# Patient Record
Sex: Male | Born: 1940 | Race: Black or African American | Hispanic: No | Smoking: Never smoker
Health system: Southern US, Community
[De-identification: ages and names within clinical notes are randomized; demographics above are authoritative.]

## PROBLEM LIST (undated history)

## (undated) ENCOUNTER — Emergency Department (HOSPITAL_COMMUNITY): Discharge status: Absent without leave | Payer: BC Managed Care – PPO

## (undated) DIAGNOSIS — G4730 Sleep apnea, unspecified: Secondary | ICD-10-CM

## (undated) DIAGNOSIS — I639 Cerebral infarction, unspecified: Secondary | ICD-10-CM

## (undated) DIAGNOSIS — I10 Essential (primary) hypertension: Secondary | ICD-10-CM

## (undated) DIAGNOSIS — C649 Malignant neoplasm of unspecified kidney, except renal pelvis: Secondary | ICD-10-CM

## (undated) DIAGNOSIS — N186 End stage renal disease: Secondary | ICD-10-CM

## (undated) DIAGNOSIS — J189 Pneumonia, unspecified organism: Secondary | ICD-10-CM

## (undated) DIAGNOSIS — I82409 Acute embolism and thrombosis of unspecified deep veins of unspecified lower extremity: Secondary | ICD-10-CM

## (undated) DIAGNOSIS — G629 Polyneuropathy, unspecified: Secondary | ICD-10-CM

## (undated) DIAGNOSIS — N400 Enlarged prostate without lower urinary tract symptoms: Secondary | ICD-10-CM

## (undated) DIAGNOSIS — D649 Anemia, unspecified: Secondary | ICD-10-CM

## (undated) DIAGNOSIS — E119 Type 2 diabetes mellitus without complications: Secondary | ICD-10-CM

## (undated) DIAGNOSIS — I517 Cardiomegaly: Secondary | ICD-10-CM

## (undated) DIAGNOSIS — N2581 Secondary hyperparathyroidism of renal origin: Secondary | ICD-10-CM

## (undated) DIAGNOSIS — R0989 Other specified symptoms and signs involving the circulatory and respiratory systems: Secondary | ICD-10-CM

## (undated) DIAGNOSIS — R001 Bradycardia, unspecified: Secondary | ICD-10-CM

## (undated) DIAGNOSIS — I350 Nonrheumatic aortic (valve) stenosis: Secondary | ICD-10-CM

## (undated) DIAGNOSIS — Z9289 Personal history of other medical treatment: Secondary | ICD-10-CM

## (undated) DIAGNOSIS — E875 Hyperkalemia: Secondary | ICD-10-CM

## (undated) DIAGNOSIS — Z8739 Personal history of other diseases of the musculoskeletal system and connective tissue: Secondary | ICD-10-CM

## (undated) DIAGNOSIS — R569 Unspecified convulsions: Secondary | ICD-10-CM

## (undated) DIAGNOSIS — Z992 Dependence on renal dialysis: Secondary | ICD-10-CM

## (undated) DIAGNOSIS — I4891 Unspecified atrial fibrillation: Secondary | ICD-10-CM

## (undated) HISTORY — DX: Cardiomegaly: I51.7

## (undated) HISTORY — DX: Bradycardia, unspecified: R00.1

## (undated) HISTORY — DX: Nonrheumatic aortic (valve) stenosis: I35.0

## (undated) HISTORY — PX: TONSILLECTOMY: SUR1361

## (undated) HISTORY — DX: Benign prostatic hyperplasia without lower urinary tract symptoms: N40.0

## (undated) HISTORY — PX: POSTERIOR CERVICAL LAMINECTOMY: SHX2248

## (undated) HISTORY — PX: AV FISTULA PLACEMENT: SHX1204

## (undated) HISTORY — PX: THROMBECTOMY: PRO61

## (undated) HISTORY — PX: RADIOFREQUENCY ABLATION KIDNEY: SHX2292

## (undated) HISTORY — DX: Malignant neoplasm of unspecified kidney, except renal pelvis: C64.9

## (undated) HISTORY — DX: Unspecified atrial fibrillation: I48.91

## (undated) HISTORY — DX: Secondary hyperparathyroidism of renal origin: N25.81

## (undated) HISTORY — DX: Anemia, unspecified: D64.9

## (undated) HISTORY — PX: AV FISTULA REPAIR: SHX563

## (undated) HISTORY — PX: BACK SURGERY: SHX140

## (undated) HISTORY — DX: Essential (primary) hypertension: I10

## (undated) HISTORY — DX: Polyneuropathy, unspecified: G62.9

## (undated) HISTORY — DX: Hyperkalemia: E87.5

## (undated) HISTORY — DX: Other specified symptoms and signs involving the circulatory and respiratory systems: R09.89

---

## 1979-08-08 HISTORY — PX: BUNIONECTOMY: SHX129

## 1997-08-07 DIAGNOSIS — I639 Cerebral infarction, unspecified: Secondary | ICD-10-CM

## 1997-08-07 HISTORY — DX: Cerebral infarction, unspecified: I63.9

## 2010-06-17 ENCOUNTER — Ambulatory Visit: Payer: Self-pay | Admitting: Vascular Surgery

## 2010-11-29 ENCOUNTER — Emergency Department (HOSPITAL_COMMUNITY)
Admission: EM | Admit: 2010-11-29 | Discharge: 2010-11-29 | Disposition: A | Discharge status: Home / Self Care | Payer: Medicare Other | Attending: Emergency Medicine | Admitting: Emergency Medicine

## 2010-11-29 DIAGNOSIS — Z79899 Other long term (current) drug therapy: Secondary | ICD-10-CM | POA: Insufficient documentation

## 2010-11-29 DIAGNOSIS — R569 Unspecified convulsions: Secondary | ICD-10-CM | POA: Insufficient documentation

## 2010-11-29 DIAGNOSIS — Z794 Long term (current) use of insulin: Secondary | ICD-10-CM | POA: Insufficient documentation

## 2010-11-29 DIAGNOSIS — R4182 Altered mental status, unspecified: Secondary | ICD-10-CM | POA: Insufficient documentation

## 2010-11-29 DIAGNOSIS — E785 Hyperlipidemia, unspecified: Secondary | ICD-10-CM | POA: Insufficient documentation

## 2010-11-29 DIAGNOSIS — Z7901 Long term (current) use of anticoagulants: Secondary | ICD-10-CM | POA: Insufficient documentation

## 2010-11-29 DIAGNOSIS — I4891 Unspecified atrial fibrillation: Secondary | ICD-10-CM | POA: Insufficient documentation

## 2010-11-29 DIAGNOSIS — I10 Essential (primary) hypertension: Secondary | ICD-10-CM | POA: Insufficient documentation

## 2010-11-29 DIAGNOSIS — Z85528 Personal history of other malignant neoplasm of kidney: Secondary | ICD-10-CM | POA: Insufficient documentation

## 2010-11-29 LAB — COMPREHENSIVE METABOLIC PANEL
Alkaline Phosphatase: 46 U/L (ref 39–117)
BUN: 23 mg/dL (ref 6–23)
CO2: 31 mEq/L (ref 19–32)
Calcium: 9.2 mg/dL (ref 8.4–10.5)
GFR calc non Af Amer: 13 mL/min — ABNORMAL LOW (ref >60)
Glucose, Bld: 155 mg/dL — ABNORMAL HIGH (ref 70–99)
Potassium: 3.7 mEq/L (ref 3.5–5.1)
Total Protein: 6.6 g/dL (ref 6.0–8.3)

## 2010-11-29 LAB — TROPONIN I: Troponin I: 0.04 ng/mL (ref 0.00–0.06)

## 2010-11-29 LAB — DIFFERENTIAL
Basophils Absolute: 0 10*3/uL (ref 0.0–0.1)
Basophils Relative: 0 % (ref 0–1)
Neutro Abs: 2.6 10*3/uL (ref 1.7–7.7)
Neutrophils Relative %: 61 % (ref 43–77)

## 2010-11-29 LAB — GLUCOSE, CAPILLARY: Glucose-Capillary: 121 mg/dL — ABNORMAL HIGH (ref 70–99)

## 2010-11-29 LAB — POCT CARDIAC MARKERS
CKMB, poc: 3.8 ng/mL (ref 1.0–8.0)
Myoglobin, poc: >500 ng/mL (ref 12–200)
Troponin i, poc: <0.05 ng/mL (ref 0.00–0.09)

## 2010-11-29 LAB — CBC
Hemoglobin: 11.2 g/dL — ABNORMAL LOW (ref 13.0–17.0)
Platelets: 187 10*3/uL (ref 150–400)
RBC: 4.06 MIL/uL — ABNORMAL LOW (ref 4.22–5.81)

## 2010-11-29 LAB — PROTIME-INR: Prothrombin Time: 16.9 seconds — ABNORMAL HIGH (ref 11.6–15.2)

## 2010-11-29 LAB — CK TOTAL AND CKMB (NOT AT ARMC)
Relative Index: INVALID (ref 0.0–2.5)
Total CK: 85 U/L (ref 7–232)

## 2010-12-20 NOTE — Assessment & Plan Note (Signed)
OFFICE VISIT   [name], [name]  DOB:  01/26/1941                                       06/17/2010  CHART#:[id number]   HISTORY OF PRESENT ILLNESS:  This is a 70 year old gentleman who  presents with chief complaint of right leg pain.  He has a longstanding  history of pain in his right leg that does not have a classic history of  claudication.  It does increase with walking but it is does not a set  amount of ambulation which brings on this pain.  Has never had any rest  type pain symptoms.  He has an extensive history of trauma to bilateral  lower extremities that have then resulted in compartment syndrome in  both legs.  First he got it in his left leg from a fall and then one on  his right leg so he had bilateral compartment releases at different  times.  Since his right compartment release he has noted previously some  complaints of pain around his Achilles.  At this point he describes it  as an aching sensation in his calves that is present even at rest.  It  does not wake him up from sleep.  He does not note any history of any  type of ulceration or gangrene in either feet.   PAST MEDICAL HISTORY:  End-stage renal disease requiring hemodialysis,  diabetes, hyperlipidemia, some type of seizure disorder, anemia, he had  a stroke that resulted in a left foot drop, secondary  hyperparathyroidism, atrial fibrillation, hypertension, benign prostatic  hypertrophy, obstructive sleep apnea, gout, osteoarthritis, a history of  renal cell carcinoma, trauma history of bilateral falls injuring both  legs that resulted in compartment syndromes.   PAST SURGICAL HISTORY:  Included radioablation of his right kidney renal  cell carcinoma.  He has had a right arteriovenous fistula placement  radiocephalic done in 0454.  He then required a fistulogram with  angioplasty in July of 2010.  He has had bilateral bunionectomy done and  then also bilateral compartment releases  completed.   SOCIAL HISTORY:  He is single, two children, a previous Runner, broadcasting/film/video.  He has  never smoked.  No alcohol or illicit drug use.   FAMILY HISTORY:  Mom and dad had diabetes and one brother had a heart  attack and he had a sister also with diabetes.   MEDICATIONS:  He is on Lantus, Coumadin, insulin, sorbitol, Renvela, he  has nitro tabs, Nephrocaps, allopurinol, Flomax, Sensipar, Keppra.   ALLERGIES:  His allergies include INFeD.   REVIEW OF SYSTEMS:  Today noted seizures, pain in legs with walking,  stroke, mini stroke, blood clots in veins, arthritis, joint pain, muscle  pain, atrial fibrillation, clotting disorder, occasional constipation  and kidney disease.  Otherwise the rest of his 12 point review of  systems was noted to be negative.   PHYSICAL EXAMINATION:  He did not have vitals listed.  General:  He was well-developed, well-nourished, no apparent distress.  Head:  Normocephalic, atraumatic.  He had some temporalis wasting.  ENT:  Oropharynx, his upper teeth are gone.  He has a denture.  Oropharynx did not have any erythema or exudate.  The nares were without  any erythema or drainage.  His hearing is grossly intact.  Eyes:  He had arcus senilis bilaterally, constricted pupils bilaterally  but he did  have some reaction to both direct and indirect pupillary  testing.  His extraocular movements were intact.  Neck:  He had supple neck.  No nuchal rigidity.  Pulmonary:  Symmetric expansion.  Good air movement.  No rhonchi, rales  or wheezing.  Cardiac:  Irregular rate and rhythm.  Normal S1-S2.  No rubs, thrills or  gallops.  He had a 2/6 systolic ejection murmur.  Vascular:  He had  palpable radials, brachials bilaterally and carotids.  There was no  bruit in either carotid.  His abdominal aorta was not palpable due to  his abdominal girth.  He had palpable femorals.  Did not appreciate  popliteals but he had easily palpable DPs bilaterally and posterior  tibial  pulses.  GI:  Soft abdomen.  Nontender.  Nondistended.  A little bit obese.  No  obvious masses.  No hepatosplenomegaly.  Musculoskeletal:  He had 5/5 upper extremity strength.  On the right arm  he has a widely patent radiocephalic arteriovenous fistula with a good  bruit and thrill.  In the lower extremities his feet are cool but with  the previously noted palpable pedal pulses.  There are no ischemic  changes in the feet.  No ulcerations or any type of gangrene.  Additionally he did have 5/5 symmetric strength in the lower  extremities.  I also briefly tested his knee.  There was no obvious  catch with passive testing or range of motion of the knee or at the  ankle.  There is no frank tenderness to palpation of the calf muscles on  either side.  There are incisions consistent with the previously noted  compartment release.  Neuro:  Cranial nerves II-XII were intact.  Sensation was intact in all  extremities.  Motor strength was as noted above.  I did not test this  patient's gait.  Psychiatric:  His judgment was intact.  His mood and affect were  appropriate for his clinical situation.  Skin:  The extremities are as listed otherwise I did not note any  obvious rashes.  Lymphatic:  He had no cervical, axillary or inguinal lymphadenopathy.   NON-INVASIVE VASCULAR IMAGING:  I also reviewed and finalized the  interpretation on bilateral lower extremity ABIs.  He does have calcific  disease with an ABI on the right side of 1.39 and 1.29, however, his  right posterior tibial and dorsalis pedis waveforms are biphasic  strongly and this is similarly on the left side strongly biphasic  signals.   MEDICAL DECISION MAKING:  This is a 70 year old gentleman with multiple  comorbidities, multiple risk factors for atherosclerosis and was sent  here for screening for peripheral arterial disease due to his right  pain.  He has atypical symptoms for claudication and also has a previous  trauma  history and history of bilateral compartment releases.  On ABI  examination he has no evidence of peripheral arterial disease and he has  palpable pedal pulses on examination.  Based on this I do not think his  pain in his right leg is due to peripheral arterial disease.  However,  with his significant trauma history I would suggest possible evaluation  by orthopedist and a set of ankle, knee, tib-fib x-rays.  He may in fact  need evaluation for possible physical therapy but I think the most  appropriate specialist to make that decision would probably be the  orthopedist.   Thank you for giving [place] the opportunity to participate in this  gentleman's care.  Leonides Sake, MD  Electronically Signed   BC/MEDQ  D:  06/17/2010  T:  06/20/2010  Job:  [id number]   cc:   Fayrene Fearing L. Deterding, M.D.

## 2011-02-09 ENCOUNTER — Other Ambulatory Visit (HOSPITAL_COMMUNITY): Payer: Medicare Other

## 2011-02-09 ENCOUNTER — Emergency Department (HOSPITAL_COMMUNITY): Payer: Medicare Other

## 2011-02-09 ENCOUNTER — Inpatient Hospital Stay (HOSPITAL_COMMUNITY)
Admission: EM | Admit: 2011-02-09 | Discharge: 2011-02-20 | DRG: 490 | Disposition: A | Discharge status: Rehabilitation Facility | Payer: Medicare Other | Attending: Cardiology | Admitting: Cardiology

## 2011-02-09 DIAGNOSIS — M109 Gout, unspecified: Secondary | ICD-10-CM | POA: Diagnosis present

## 2011-02-09 DIAGNOSIS — E785 Hyperlipidemia, unspecified: Secondary | ICD-10-CM | POA: Diagnosis present

## 2011-02-09 DIAGNOSIS — Z85528 Personal history of other malignant neoplasm of kidney: Secondary | ICD-10-CM

## 2011-02-09 DIAGNOSIS — N039 Chronic nephritic syndrome with unspecified morphologic changes: Secondary | ICD-10-CM | POA: Diagnosis present

## 2011-02-09 DIAGNOSIS — N186 End stage renal disease: Secondary | ICD-10-CM | POA: Diagnosis present

## 2011-02-09 DIAGNOSIS — Z91199 Patient's noncompliance with other medical treatment and regimen due to unspecified reason: Secondary | ICD-10-CM

## 2011-02-09 DIAGNOSIS — Z7982 Long term (current) use of aspirin: Secondary | ICD-10-CM

## 2011-02-09 DIAGNOSIS — K5900 Constipation, unspecified: Secondary | ICD-10-CM | POA: Diagnosis not present

## 2011-02-09 DIAGNOSIS — I498 Other specified cardiac arrhythmias: Secondary | ICD-10-CM | POA: Diagnosis present

## 2011-02-09 DIAGNOSIS — G40909 Epilepsy, unspecified, not intractable, without status epilepticus: Secondary | ICD-10-CM | POA: Diagnosis present

## 2011-02-09 DIAGNOSIS — Z923 Personal history of irradiation: Secondary | ICD-10-CM

## 2011-02-09 DIAGNOSIS — I359 Nonrheumatic aortic valve disorder, unspecified: Secondary | ICD-10-CM | POA: Diagnosis present

## 2011-02-09 DIAGNOSIS — Z9181 History of falling: Secondary | ICD-10-CM

## 2011-02-09 DIAGNOSIS — E876 Hypokalemia: Secondary | ICD-10-CM | POA: Diagnosis present

## 2011-02-09 DIAGNOSIS — M4712 Other spondylosis with myelopathy, cervical region: Principal | ICD-10-CM | POA: Diagnosis present

## 2011-02-09 DIAGNOSIS — R29898 Other symptoms and signs involving the musculoskeletal system: Secondary | ICD-10-CM | POA: Diagnosis present

## 2011-02-09 DIAGNOSIS — Z833 Family history of diabetes mellitus: Secondary | ICD-10-CM

## 2011-02-09 DIAGNOSIS — Z794 Long term (current) use of insulin: Secondary | ICD-10-CM

## 2011-02-09 DIAGNOSIS — E1149 Type 2 diabetes mellitus with other diabetic neurological complication: Secondary | ICD-10-CM | POA: Diagnosis present

## 2011-02-09 DIAGNOSIS — D631 Anemia in chronic kidney disease: Secondary | ICD-10-CM | POA: Diagnosis present

## 2011-02-09 DIAGNOSIS — Z79899 Other long term (current) drug therapy: Secondary | ICD-10-CM

## 2011-02-09 DIAGNOSIS — I4891 Unspecified atrial fibrillation: Secondary | ICD-10-CM | POA: Diagnosis present

## 2011-02-09 DIAGNOSIS — Z7901 Long term (current) use of anticoagulants: Secondary | ICD-10-CM

## 2011-02-09 DIAGNOSIS — N2581 Secondary hyperparathyroidism of renal origin: Secondary | ICD-10-CM | POA: Diagnosis present

## 2011-02-09 DIAGNOSIS — E1142 Type 2 diabetes mellitus with diabetic polyneuropathy: Secondary | ICD-10-CM | POA: Diagnosis present

## 2011-02-09 DIAGNOSIS — I120 Hypertensive chronic kidney disease with stage 5 chronic kidney disease or end stage renal disease: Secondary | ICD-10-CM | POA: Diagnosis present

## 2011-02-09 DIAGNOSIS — Z992 Dependence on renal dialysis: Secondary | ICD-10-CM

## 2011-02-09 DIAGNOSIS — E875 Hyperkalemia: Secondary | ICD-10-CM | POA: Diagnosis not present

## 2011-02-09 DIAGNOSIS — I69998 Other sequelae following unspecified cerebrovascular disease: Secondary | ICD-10-CM

## 2011-02-09 DIAGNOSIS — M1990 Unspecified osteoarthritis, unspecified site: Secondary | ICD-10-CM | POA: Diagnosis present

## 2011-02-09 DIAGNOSIS — Z9119 Patient's noncompliance with other medical treatment and regimen: Secondary | ICD-10-CM

## 2011-02-09 LAB — CBC
MCH: 28.5 pg (ref 26.0–34.0)
MCHC: 33.1 g/dL (ref 30.0–36.0)
MCV: 86 fL (ref 78.0–100.0)
Platelets: 223 10*3/uL (ref 150–400)
RBC: 3.51 MIL/uL — ABNORMAL LOW (ref 4.22–5.81)
RDW: 14.4 % (ref 11.5–15.5)

## 2011-02-09 LAB — BASIC METABOLIC PANEL
CO2: 34 mEq/L — ABNORMAL HIGH (ref 19–32)
Calcium: 9 mg/dL (ref 8.4–10.5)
Creatinine, Ser: 3.19 mg/dL — ABNORMAL HIGH (ref 0.50–1.35)
GFR calc non Af Amer: 19 mL/min — ABNORMAL LOW (ref >60)
Glucose, Bld: 91 mg/dL (ref 70–99)
Sodium: 139 mEq/L (ref 135–145)

## 2011-02-09 LAB — DIFFERENTIAL
Basophils Absolute: 0 10*3/uL (ref 0.0–0.1)
Basophils Relative: 0 % (ref 0–1)
Eosinophils Absolute: 0.1 10*3/uL (ref 0.0–0.7)
Eosinophils Relative: 2 % (ref 0–5)
Neutrophils Relative %: 57 % (ref 43–77)

## 2011-02-09 LAB — GLUCOSE, CAPILLARY
Glucose-Capillary: 91 mg/dL (ref 70–99)
Glucose-Capillary: 230 mg/dL — ABNORMAL HIGH (ref 70–99)

## 2011-02-09 LAB — PROTIME-INR: Prothrombin Time: 14.8 seconds (ref 11.6–15.2)

## 2011-02-10 ENCOUNTER — Inpatient Hospital Stay (HOSPITAL_COMMUNITY): Payer: Medicare Other

## 2011-02-10 DIAGNOSIS — G8250 Quadriplegia, unspecified: Secondary | ICD-10-CM

## 2011-02-10 DIAGNOSIS — M4712 Other spondylosis with myelopathy, cervical region: Secondary | ICD-10-CM

## 2011-02-10 LAB — LIPID PANEL
HDL: 49 mg/dL (ref >39)
LDL Cholesterol: 38 mg/dL (ref 0–99)
Triglycerides: 66 mg/dL (ref <150)
VLDL: 13 mg/dL (ref 0–40)

## 2011-02-10 LAB — GLUCOSE, CAPILLARY: Glucose-Capillary: 187 mg/dL — ABNORMAL HIGH (ref 70–99)

## 2011-02-11 ENCOUNTER — Inpatient Hospital Stay (HOSPITAL_COMMUNITY): Payer: Medicare Other

## 2011-02-11 DIAGNOSIS — Z01810 Encounter for preprocedural cardiovascular examination: Secondary | ICD-10-CM

## 2011-02-11 DIAGNOSIS — I4891 Unspecified atrial fibrillation: Secondary | ICD-10-CM

## 2011-02-11 LAB — CBC
Hemoglobin: 9.4 g/dL — ABNORMAL LOW (ref 13.0–17.0)
MCH: 27.8 pg (ref 26.0–34.0)
Platelets: 202 10*3/uL (ref 150–400)
RBC: 3.38 MIL/uL — ABNORMAL LOW (ref 4.22–5.81)
WBC: 4.4 10*3/uL (ref 4.0–10.5)

## 2011-02-11 LAB — RENAL FUNCTION PANEL
CO2: 34 mEq/L — ABNORMAL HIGH (ref 19–32)
CO2: 33 mEq/L — ABNORMAL HIGH (ref 19–32)
Calcium: 9.1 mg/dL (ref 8.4–10.5)
Calcium: 8.9 mg/dL (ref 8.4–10.5)
Chloride: 98 mEq/L (ref 96–112)
GFR calc Af Amer: 10 mL/min — ABNORMAL LOW (ref >60)
GFR calc Af Amer: 10 mL/min — ABNORMAL LOW (ref >60)
GFR calc non Af Amer: 8 mL/min — ABNORMAL LOW (ref >60)
GFR calc non Af Amer: 8 mL/min — ABNORMAL LOW (ref >60)
Glucose, Bld: 102 mg/dL — ABNORMAL HIGH (ref 70–99)
Glucose, Bld: 84 mg/dL (ref 70–99)
Potassium: 4.2 mEq/L (ref 3.5–5.1)
Potassium: 4.4 mEq/L (ref 3.5–5.1)
Sodium: 141 mEq/L (ref 135–145)
Sodium: 140 mEq/L (ref 135–145)

## 2011-02-11 LAB — MAGNESIUM
Magnesium: 2.5 mg/dL (ref 1.5–2.5)
Magnesium: 2.4 mg/dL (ref 1.5–2.5)

## 2011-02-11 LAB — HEMOGLOBIN A1C: Mean Plasma Glucose: 114 mg/dL (ref <117)

## 2011-02-11 LAB — PROTIME-INR: INR: 1.2 (ref 0.00–1.49)

## 2011-02-11 LAB — GLUCOSE, CAPILLARY
Glucose-Capillary: 116 mg/dL — ABNORMAL HIGH (ref 70–99)
Glucose-Capillary: 117 mg/dL — ABNORMAL HIGH (ref 70–99)

## 2011-02-11 NOTE — Consult Note (Signed)
NAMEAVYAN, [name] NO.:  [id number]  MEDICAL RECORD NO.:  [id number]  LOCATION:  6713                         FACILITY:  [facility]  PHYSICIAN:  Danae Orleans. Venetia Maxon, M.D.  DATE OF BIRTH:  1940-12-10  DATE OF CONSULTATION:  02/10/2011 DATE OF DISCHARGE:                                CONSULTATION   REASON FOR CONSULTATION:  Bilateral upper and lower extremity weakness with numbness and frequent falling.  HISTORY OF PRESENT ILLNESS:  [name] is a 70 year old man with type 2 diabetes, end-stage renal disease on hemodialysis, paroxysmal atrial fibrillation, and multiple other medical problems.  He has developed progressive muscular weakness and frequent falling.  He says that 3-4 years ago, he had numbness in several fingers in the right hand which progressed to numbness in both of his hands.  He previously had a stroke in 1999, which caused some weakness in his left leg including a footdrop, and has now developed progressive weakness in both of his lower extremities and starting to lose function in his right leg, which has traditionally been his stronger leg.  He has had some problems of incontinence of stool over the last several months.  He has noted some slight weight loss.  PAST MEDICAL HISTORY:  Significant for hemodialysis on Tuesdays, Thursdays, and Saturdays, chronic atrial fibrillation for he is on Coumadin, history of CVA with residual left leg weakness since 1999, dyslipidemia, anemia of chronic disease, secondary hyperparathyroidism, history of kidney malignancy treated with radioablation 7 years ago, type 2 diabetes on insulin, gout, osteoarthritis, seizure disorder, history of CVA.  MEDICATIONS: 1. Coumadin 7.5 mg daily. 2. Keppra 250 mg twice daily. 3. Lantus 16 units at bedtime. 4. Humalog with meals. 5. Sorbitol 2 tablespoons every 3 hours as needed. 6. Renvela 800 mg three tablets t.i.d. a.c. 7. Humalog 5 units t.i.d. a.c. 8. Allopurinol  100 mg daily. 9. Flomax 0.4 mg daily. 10.Sensipar 30 mg daily.  He has no known drug allergies.  SOCIAL HISTORY:  He is single and divorced.  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  FAMILY HISTORY:  Significant for diabetes in both parents.  PHYSICAL EXAMINATION:  Today, he is alert, pleasant, cooperative and appears to have weakness in both upper extremity approximately 4/5 and has marked hand intrinsic weakness bilaterally, left greater than right. He also has diminish motor strength in both lower extremities 1-2/5 in his left leg and about 3-4/5 in his right leg with proximal lower extremity weakness as well.  He has a positive Hoffman sign in left greater than right hand.  Reflexes are 2 in the biceps and triceps, 2 at the knees.  Also on examination today, he has mildly positive Tinel sign in both hands.  Notes numbness throughout both hands and both of his feet.  I reviewed imaging studies consistent with a cervical MRI which demonstrates marked spinal cord compression and severe spinal stenosis C3-C5 levels, principally the posterior cervical cord compression secondary to the ligamentous hypertrophy.  The lumbar MRI was essentially unremarkable.  IMPRESSION AND RECOMMENDATIONS:  [name] is a 70 year old man with end-stage renal disease on hemodialysis, on Coumadin.  He has multiple medical problems.  He has severe spinal cord compression with decreased cord signal on MRI and would likely benefit from a posterior cervical laminectomy from C3-C5 levels if he is medically cleared to proceed.  He will need to come off his Coumadin and have his INR corrected if we were to proceed.  We would like to have Cardiology clearance and also Medical clearance for [place] to proceed with surgical decompression.  I discussed this at length with the patient.  He said that he wished to do something.  He wants to be aggressive if there is a chance of him improving.  I told him  there is significant risk with multiple medical comorbidities proceeding with surgery; however, if he continues as he is, he is going to lose function of his limb where he is going to become unable to walk.  I told him if we do surgery, there is no guarantee that he will get better, but I think if we do not proceed with surgery, I think it is likely that he will progressively worsen.  He wishes to proceed.  We will await medical clearance for [place] to be able to go ahead with surgery.     Danae Orleans. Venetia Maxon, M.D.     JDS/MEDQ  D:  02/10/2011  T:  02/11/2011  Job:  [id number]  Electronically Signed by Maeola Harman M.D. on 02/11/2011 01:14:31 PM

## 2011-02-12 DIAGNOSIS — I359 Nonrheumatic aortic valve disorder, unspecified: Secondary | ICD-10-CM

## 2011-02-12 DIAGNOSIS — R0989 Other specified symptoms and signs involving the circulatory and respiratory systems: Secondary | ICD-10-CM

## 2011-02-12 LAB — GLUCOSE, CAPILLARY
Glucose-Capillary: 101 mg/dL — ABNORMAL HIGH (ref 70–99)
Glucose-Capillary: 98 mg/dL (ref 70–99)
Glucose-Capillary: 115 mg/dL — ABNORMAL HIGH (ref 70–99)

## 2011-02-12 LAB — PROTIME-INR: Prothrombin Time: 18.7 seconds — ABNORMAL HIGH (ref 11.6–15.2)

## 2011-02-13 ENCOUNTER — Inpatient Hospital Stay (HOSPITAL_COMMUNITY): Payer: Medicare Other

## 2011-02-13 LAB — GLUCOSE, CAPILLARY
Glucose-Capillary: 70 mg/dL (ref 70–99)
Glucose-Capillary: 83 mg/dL (ref 70–99)

## 2011-02-13 LAB — CBC
MCH: 28.7 pg (ref 26.0–34.0)
MCV: 87 fL (ref 78.0–100.0)
Platelets: 189 10*3/uL (ref 150–400)
RDW: 14.3 % (ref 11.5–15.5)
WBC: 5.4 10*3/uL (ref 4.0–10.5)

## 2011-02-13 LAB — RENAL FUNCTION PANEL
Albumin: 2.6 g/dL — ABNORMAL LOW (ref 3.5–5.2)
BUN: 42 mg/dL — ABNORMAL HIGH (ref 6–23)
Calcium: 9.6 mg/dL (ref 8.4–10.5)
Glucose, Bld: 119 mg/dL — ABNORMAL HIGH (ref 70–99)
Phosphorus: 4.4 mg/dL (ref 2.3–4.6)

## 2011-02-13 LAB — PROTIME-INR: Prothrombin Time: 16.2 seconds — ABNORMAL HIGH (ref 11.6–15.2)

## 2011-02-14 ENCOUNTER — Inpatient Hospital Stay (HOSPITAL_COMMUNITY): Payer: Medicare Other

## 2011-02-14 DIAGNOSIS — I060 Rheumatic aortic stenosis: Secondary | ICD-10-CM

## 2011-02-14 LAB — RENAL FUNCTION PANEL
Albumin: 2.4 g/dL — ABNORMAL LOW (ref 3.5–5.2)
BUN: 58 mg/dL — ABNORMAL HIGH (ref 6–23)
Creatinine, Ser: 8.45 mg/dL — ABNORMAL HIGH (ref 0.50–1.35)
Glucose, Bld: 92 mg/dL (ref 70–99)
Phosphorus: 5.2 mg/dL — ABNORMAL HIGH (ref 2.3–4.6)

## 2011-02-14 LAB — CBC
HCT: 27.6 % — ABNORMAL LOW (ref 39.0–52.0)
Hemoglobin: 9.1 g/dL — ABNORMAL LOW (ref 13.0–17.0)
MCHC: 33 g/dL (ref 30.0–36.0)
MCV: 86.3 fL (ref 78.0–100.0)
RDW: 14.4 % (ref 11.5–15.5)

## 2011-02-14 LAB — GLUCOSE, CAPILLARY: Glucose-Capillary: 189 mg/dL — ABNORMAL HIGH (ref 70–99)

## 2011-02-15 ENCOUNTER — Inpatient Hospital Stay (HOSPITAL_COMMUNITY): Payer: Medicare Other

## 2011-02-15 LAB — RENAL FUNCTION PANEL
Albumin: 2.5 g/dL — ABNORMAL LOW (ref 3.5–5.2)
CO2: 29 mEq/L (ref 19–32)
Calcium: 8.8 mg/dL (ref 8.4–10.5)
Creatinine, Ser: 5.34 mg/dL — ABNORMAL HIGH (ref 0.50–1.35)
GFR calc Af Amer: 13 mL/min — ABNORMAL LOW (ref >60)
GFR calc non Af Amer: 11 mL/min — ABNORMAL LOW (ref >60)
Sodium: 136 mEq/L (ref 135–145)

## 2011-02-15 LAB — CBC
HCT: 28.9 % — ABNORMAL LOW (ref 39.0–52.0)
MCV: 87.8 fL (ref 78.0–100.0)
Platelets: 204 10*3/uL (ref 150–400)
RBC: 3.29 MIL/uL — ABNORMAL LOW (ref 4.22–5.81)
RDW: 14.7 % (ref 11.5–15.5)
WBC: 5 10*3/uL (ref 4.0–10.5)

## 2011-02-15 LAB — GLUCOSE, CAPILLARY

## 2011-02-15 LAB — SURGICAL PCR SCREEN: Staphylococcus aureus: POSITIVE — AB

## 2011-02-15 LAB — APTT: aPTT: 36 seconds (ref 24–37)

## 2011-02-15 LAB — ABO/RH: ABO/RH(D): O POS

## 2011-02-15 LAB — PROTIME-INR: INR: 1.24 (ref 0.00–1.49)

## 2011-02-16 ENCOUNTER — Inpatient Hospital Stay (HOSPITAL_COMMUNITY): Payer: Medicare Other

## 2011-02-16 LAB — CBC
HCT: 28.7 % — ABNORMAL LOW (ref 39.0–52.0)
Hemoglobin: 9.4 g/dL — ABNORMAL LOW (ref 13.0–17.0)
MCH: 28.3 pg (ref 26.0–34.0)
MCHC: 32.8 g/dL (ref 30.0–36.0)
RBC: 3.32 MIL/uL — ABNORMAL LOW (ref 4.22–5.81)

## 2011-02-16 LAB — GLUCOSE, CAPILLARY
Glucose-Capillary: 108 mg/dL — ABNORMAL HIGH (ref 70–99)
Glucose-Capillary: 142 mg/dL — ABNORMAL HIGH (ref 70–99)
Glucose-Capillary: 127 mg/dL — ABNORMAL HIGH (ref 70–99)

## 2011-02-16 LAB — BASIC METABOLIC PANEL
BUN: 47 mg/dL — ABNORMAL HIGH (ref 6–23)
CO2: 27 mEq/L (ref 19–32)
Chloride: 98 mEq/L (ref 96–112)
GFR calc Af Amer: 9 mL/min — ABNORMAL LOW (ref >60)
Potassium: 5.6 mEq/L — ABNORMAL HIGH (ref 3.5–5.1)

## 2011-02-16 LAB — PROTIME-INR: Prothrombin Time: 15.1 seconds (ref 11.6–15.2)

## 2011-02-17 LAB — GLUCOSE, CAPILLARY
Glucose-Capillary: 93 mg/dL (ref 70–99)
Glucose-Capillary: 106 mg/dL — ABNORMAL HIGH (ref 70–99)

## 2011-02-17 LAB — POCT I-STAT 4, (NA,K, GLUC, HGB,HCT)
Glucose, Bld: 91 mg/dL (ref 70–99)
HCT: 27 % — ABNORMAL LOW (ref 39.0–52.0)
Potassium: 5.6 mEq/L — ABNORMAL HIGH (ref 3.5–5.1)

## 2011-02-17 LAB — POCT I-STAT GLUCOSE: Operator id: 132841

## 2011-02-18 ENCOUNTER — Inpatient Hospital Stay (HOSPITAL_COMMUNITY): Payer: Medicare Other

## 2011-02-18 LAB — GLUCOSE, CAPILLARY
Glucose-Capillary: 135 mg/dL — ABNORMAL HIGH (ref 70–99)
Glucose-Capillary: 122 mg/dL — ABNORMAL HIGH (ref 70–99)

## 2011-02-18 LAB — RENAL FUNCTION PANEL
CO2: 31 mEq/L (ref 19–32)
Chloride: 94 mEq/L — ABNORMAL LOW (ref 96–112)
GFR calc Af Amer: 8 mL/min — ABNORMAL LOW (ref >60)
GFR calc non Af Amer: 6 mL/min — ABNORMAL LOW (ref >60)
Glucose, Bld: 126 mg/dL — ABNORMAL HIGH (ref 70–99)
Sodium: 134 mEq/L — ABNORMAL LOW (ref 135–145)

## 2011-02-18 LAB — CBC
HCT: 26.7 % — ABNORMAL LOW (ref 39.0–52.0)
MCH: 28.4 pg (ref 26.0–34.0)
MCV: 87.3 fL (ref 78.0–100.0)
RBC: 3.06 MIL/uL — ABNORMAL LOW (ref 4.22–5.81)
RDW: 14.5 % (ref 11.5–15.5)
WBC: 5.8 10*3/uL (ref 4.0–10.5)

## 2011-02-19 LAB — CROSSMATCH
ABO/RH(D): O POS
Antibody Screen: NEGATIVE
Unit division: 0

## 2011-02-19 LAB — GLUCOSE, CAPILLARY
Glucose-Capillary: 82 mg/dL (ref 70–99)
Glucose-Capillary: 122 mg/dL — ABNORMAL HIGH (ref 70–99)
Glucose-Capillary: 138 mg/dL — ABNORMAL HIGH (ref 70–99)

## 2011-02-20 ENCOUNTER — Inpatient Hospital Stay (HOSPITAL_COMMUNITY)
Admission: AD | Admit: 2011-02-20 | Discharge: 2011-03-14 | DRG: 945 | Disposition: A | Discharge status: Skilled Nursing Facility | Payer: Medicare Other | Source: Ambulatory Visit | Attending: Physical Medicine & Rehabilitation | Admitting: Physical Medicine & Rehabilitation

## 2011-02-20 DIAGNOSIS — IMO0001 Reserved for inherently not codable concepts without codable children: Secondary | ICD-10-CM

## 2011-02-20 DIAGNOSIS — M4712 Other spondylosis with myelopathy, cervical region: Secondary | ICD-10-CM

## 2011-02-20 DIAGNOSIS — F329 Major depressive disorder, single episode, unspecified: Secondary | ICD-10-CM

## 2011-02-20 DIAGNOSIS — D509 Iron deficiency anemia, unspecified: Secondary | ICD-10-CM

## 2011-02-20 DIAGNOSIS — D638 Anemia in other chronic diseases classified elsewhere: Secondary | ICD-10-CM

## 2011-02-20 DIAGNOSIS — F3289 Other specified depressive episodes: Secondary | ICD-10-CM

## 2011-02-20 DIAGNOSIS — D62 Acute posthemorrhagic anemia: Secondary | ICD-10-CM

## 2011-02-20 DIAGNOSIS — G609 Hereditary and idiopathic neuropathy, unspecified: Secondary | ICD-10-CM

## 2011-02-20 DIAGNOSIS — Z7901 Long term (current) use of anticoagulants: Secondary | ICD-10-CM

## 2011-02-20 DIAGNOSIS — G4733 Obstructive sleep apnea (adult) (pediatric): Secondary | ICD-10-CM

## 2011-02-20 DIAGNOSIS — E1165 Type 2 diabetes mellitus with hyperglycemia: Secondary | ICD-10-CM

## 2011-02-20 DIAGNOSIS — G8250 Quadriplegia, unspecified: Secondary | ICD-10-CM

## 2011-02-20 DIAGNOSIS — N186 End stage renal disease: Secondary | ICD-10-CM

## 2011-02-20 DIAGNOSIS — Z85528 Personal history of other malignant neoplasm of kidney: Secondary | ICD-10-CM

## 2011-02-20 DIAGNOSIS — I498 Other specified cardiac arrhythmias: Secondary | ICD-10-CM

## 2011-02-20 DIAGNOSIS — Z9889 Other specified postprocedural states: Secondary | ICD-10-CM

## 2011-02-20 DIAGNOSIS — I69959 Hemiplegia and hemiparesis following unspecified cerebrovascular disease affecting unspecified side: Secondary | ICD-10-CM

## 2011-02-20 DIAGNOSIS — R5381 Other malaise: Secondary | ICD-10-CM

## 2011-02-20 DIAGNOSIS — N319 Neuromuscular dysfunction of bladder, unspecified: Secondary | ICD-10-CM

## 2011-02-20 DIAGNOSIS — Z5189 Encounter for other specified aftercare: Principal | ICD-10-CM

## 2011-02-20 DIAGNOSIS — Z794 Long term (current) use of insulin: Secondary | ICD-10-CM

## 2011-02-20 DIAGNOSIS — K5900 Constipation, unspecified: Secondary | ICD-10-CM

## 2011-02-20 DIAGNOSIS — I69998 Other sequelae following unspecified cerebrovascular disease: Secondary | ICD-10-CM

## 2011-02-20 DIAGNOSIS — N2581 Secondary hyperparathyroidism of renal origin: Secondary | ICD-10-CM

## 2011-02-20 DIAGNOSIS — M216X9 Other acquired deformities of unspecified foot: Secondary | ICD-10-CM

## 2011-02-20 DIAGNOSIS — I359 Nonrheumatic aortic valve disorder, unspecified: Secondary | ICD-10-CM

## 2011-02-20 DIAGNOSIS — M109 Gout, unspecified: Secondary | ICD-10-CM

## 2011-02-20 DIAGNOSIS — B9689 Other specified bacterial agents as the cause of diseases classified elsewhere: Secondary | ICD-10-CM

## 2011-02-20 DIAGNOSIS — L89109 Pressure ulcer of unspecified part of back, unspecified stage: Secondary | ICD-10-CM

## 2011-02-20 DIAGNOSIS — N390 Urinary tract infection, site not specified: Secondary | ICD-10-CM

## 2011-02-20 DIAGNOSIS — G40909 Epilepsy, unspecified, not intractable, without status epilepticus: Secondary | ICD-10-CM

## 2011-02-20 DIAGNOSIS — I120 Hypertensive chronic kidney disease with stage 5 chronic kidney disease or end stage renal disease: Secondary | ICD-10-CM

## 2011-02-20 DIAGNOSIS — E119 Type 2 diabetes mellitus without complications: Secondary | ICD-10-CM

## 2011-02-20 DIAGNOSIS — Z79899 Other long term (current) drug therapy: Secondary | ICD-10-CM

## 2011-02-20 DIAGNOSIS — I4891 Unspecified atrial fibrillation: Secondary | ICD-10-CM

## 2011-02-20 DIAGNOSIS — Z992 Dependence on renal dialysis: Secondary | ICD-10-CM

## 2011-02-20 DIAGNOSIS — L8992 Pressure ulcer of unspecified site, stage 2: Secondary | ICD-10-CM

## 2011-02-21 ENCOUNTER — Inpatient Hospital Stay (HOSPITAL_COMMUNITY): Payer: Medicare Other

## 2011-02-21 LAB — GLUCOSE, CAPILLARY: Glucose-Capillary: 110 mg/dL — ABNORMAL HIGH (ref 70–99)

## 2011-02-21 LAB — CBC
HCT: 24.7 % — ABNORMAL LOW (ref 39.0–52.0)
Hemoglobin: 8.2 g/dL — ABNORMAL LOW (ref 13.0–17.0)
MCH: 28.3 pg (ref 26.0–34.0)
MCHC: 33.2 g/dL (ref 30.0–36.0)
Platelets: 199 10*3/uL (ref 150–400)
RBC: 2.9 MIL/uL — ABNORMAL LOW (ref 4.22–5.81)
WBC: 5.6 10*3/uL (ref 4.0–10.5)

## 2011-02-21 LAB — RENAL FUNCTION PANEL
Calcium: 9.8 mg/dL (ref 8.4–10.5)
Creatinine, Ser: 9.67 mg/dL — ABNORMAL HIGH (ref 0.50–1.35)
GFR calc Af Amer: 7 mL/min — ABNORMAL LOW (ref >60)
GFR calc non Af Amer: 5 mL/min — ABNORMAL LOW (ref >60)
Glucose, Bld: 152 mg/dL — ABNORMAL HIGH (ref 70–99)
Phosphorus: 6.8 mg/dL — ABNORMAL HIGH (ref 2.3–4.6)
Sodium: 131 mEq/L — ABNORMAL LOW (ref 135–145)

## 2011-02-22 DIAGNOSIS — N186 End stage renal disease: Secondary | ICD-10-CM

## 2011-02-22 DIAGNOSIS — Z5189 Encounter for other specified aftercare: Secondary | ICD-10-CM

## 2011-02-22 DIAGNOSIS — M4712 Other spondylosis with myelopathy, cervical region: Secondary | ICD-10-CM

## 2011-02-22 DIAGNOSIS — G8250 Quadriplegia, unspecified: Secondary | ICD-10-CM

## 2011-02-22 DIAGNOSIS — E1165 Type 2 diabetes mellitus with hyperglycemia: Secondary | ICD-10-CM

## 2011-02-22 LAB — URINE MICROSCOPIC-ADD ON

## 2011-02-22 LAB — URINALYSIS, ROUTINE W REFLEX MICROSCOPIC
Bilirubin Urine: NEGATIVE
Nitrite: NEGATIVE
Protein, ur: 100 mg/dL — AB
Specific Gravity, Urine: 1.011 (ref 1.005–1.030)
Urobilinogen, UA: 0.2 mg/dL (ref 0.0–1.0)

## 2011-02-22 LAB — GLUCOSE, CAPILLARY
Glucose-Capillary: 97 mg/dL (ref 70–99)
Glucose-Capillary: 105 mg/dL — ABNORMAL HIGH (ref 70–99)
Glucose-Capillary: 101 mg/dL — ABNORMAL HIGH (ref 70–99)

## 2011-02-23 ENCOUNTER — Inpatient Hospital Stay (HOSPITAL_COMMUNITY): Payer: Medicare Other

## 2011-02-23 LAB — RENAL FUNCTION PANEL
Albumin: 2.5 g/dL — ABNORMAL LOW (ref 3.5–5.2)
CO2: 29 mEq/L (ref 19–32)
Chloride: 94 mEq/L — ABNORMAL LOW (ref 96–112)
Creatinine, Ser: 8.07 mg/dL — ABNORMAL HIGH (ref 0.50–1.35)
GFR calc Af Amer: 8 mL/min — ABNORMAL LOW (ref >60)
GFR calc non Af Amer: 7 mL/min — ABNORMAL LOW (ref >60)
Potassium: 5.5 mEq/L — ABNORMAL HIGH (ref 3.5–5.1)
Sodium: 134 mEq/L — ABNORMAL LOW (ref 135–145)

## 2011-02-23 LAB — CBC
MCV: 84.9 fL (ref 78.0–100.0)
Platelets: 209 10*3/uL (ref 150–400)
RBC: 2.99 MIL/uL — ABNORMAL LOW (ref 4.22–5.81)
RDW: 14.5 % (ref 11.5–15.5)
WBC: 6.1 10*3/uL (ref 4.0–10.5)

## 2011-02-23 LAB — GLUCOSE, CAPILLARY: Glucose-Capillary: 96 mg/dL (ref 70–99)

## 2011-02-24 DIAGNOSIS — N186 End stage renal disease: Secondary | ICD-10-CM

## 2011-02-24 DIAGNOSIS — G8250 Quadriplegia, unspecified: Secondary | ICD-10-CM

## 2011-02-24 DIAGNOSIS — Z5189 Encounter for other specified aftercare: Secondary | ICD-10-CM

## 2011-02-24 DIAGNOSIS — E1165 Type 2 diabetes mellitus with hyperglycemia: Secondary | ICD-10-CM

## 2011-02-24 DIAGNOSIS — M4712 Other spondylosis with myelopathy, cervical region: Secondary | ICD-10-CM

## 2011-02-24 LAB — GLUCOSE, CAPILLARY
Glucose-Capillary: 103 mg/dL — ABNORMAL HIGH (ref 70–99)
Glucose-Capillary: 138 mg/dL — ABNORMAL HIGH (ref 70–99)

## 2011-02-24 NOTE — Progress Notes (Signed)
[name], ECKSTEIN NO.:  [id number]  MEDICAL RECORD NO.:  [id number]  LOCATION:  6713                         FACILITY:  [facility]  PHYSICIAN:  Rosanna Randy, MDDATE OF BIRTH:  1940/08/12                                PROGRESS NOTE   DAY OF DISCHARGE: Still pending at this moment.  PRIMARY CARE PHYSICIAN: Dr. Darrick Penna.  CURRENT DIAGNOSES: 1. End-stage renal disease on hemodialysis Tuesday, Thursday, and     Saturday. 2. Myelopathy causing the patient to experience right and left upper     extremity weakness and lack of coordination with also right lower     extremity weakness. 3. Anemia of chronic disease secondary to end-stage renal disease. 4. Atrial fibrillation. 5. Aortic stenosis, moderate. 6. Secondary hyperparathyroidism. 7. Diabetes. 8. Seizure disorder. 9. Neuropathy, diabetic. 10.Hypertension. 11.Hyperkalemia. 12.Renal lesion at the upper pole of the right kidney.  The patient     had a history of renal cancer, but according to him was treated     with a gamma knife radiation. 13.Constipation. 14.Benign prostatic hypertrophy. 15.Gout.  CURRENT MEDICATIONS: 1. Allopurinol 100 mg p.o. at bedtime. 2. Sensipar 30 mg p.o. three times a day with meals. 3. Aranesp 100 mcg IV during dialysis on Saturday. 4. Neurontin 100 mg p.o. three times a day. 5. Sliding scale insulin sensitive without mealtime or bedtime     coverage. 6. Lantus 10 units subcutaneously at bedtime. 7. Labetalol 100 mg p.o. twice a day. 8. Keppra 500 mg by mouth twice a day. 9. Zemplar 5 mcg IV with hemodialysis. 10.Nephro-Vite 1 tablet p.o. at bedtime. 11.Renagel 2400 mg p.o. t.i.d. with meal. 12.Flomax 0.4 mg p.o. at bedtime. 13.The patient is on p.r.n. Tylenol 650 mg p.o. q.6h. as needed for     pain and fever. 14.Calcium carbonate 500 mg p.o. q.6h. p.r.n. 15.Clonidine 0.1 mg p.o. b.i.d. p.r.n. for hypertension. 16.Zolpidem 5 mg p.o. at bedtime. p.r.n. for  insomnia and is also on     the p.r.n. hemodialysis orders sets including MiraLax, Phenergan,     Zofran, sorbitol, and also Nephro.  In case that he is not eating     well, he will be started on this medication as part of the order     sets by patients on hemodialysis.  PROCEDURE PERFORMED: During this hospitalization on February 09, 2011, he had a chest x-ray that demonstrated no evidence of acute cardiopulmonary process.  Has a CT of the head without contrast with no intracranial hemorrhage or CT evidence of large acute infarct.  There is a mild small vessel disease type changes.  The patient has an MRI of the lower spine and also of the cervical spine that demonstrated advanced cervical spondylosis with spinal stenosis at the C3-C4 and C4-C5 level with cord deformity and a normal core signal.  Bilateral neural foraminal stenosis at those levels was also appreciated.  There is a lesser spondylosis at C5-C6 with effacement of the subarachnoid space and only mild cord deformity.  No abnormal core edema at this level was appreciated.  Neural foraminal narrowing bilaterally was also seen at C5-C6.  Bilateral neural foraminal stenosis at C7-T1 that could affect either  C8 nerve root was also seen.  An MRI of the lumbar spine demonstrated L2-L3 noncompressive disk bulge, L3-L4 disk bulge and facet hypertrophy with mild stenosis of the lateral recesses and foramina, no definite compression was seen.  L4- L5 disk bulge and facet hypertrophy with a mild narrowing of the lateral recesses and neural foramina with no definite compression, multiple renal cysts.  At the upper pole of the right kidney, there is a complex lesion that is not completely evaluated for the possibility of a mass versus cyst.  Given the abnormal renal function, an MRI without contrast will be suggested.  The patient had an MRI of the abdomen without contrast with impression that demonstrated, 1. Complex upper pole right renal  mass is confirmed.  Complexity is at     least partially secondary to underlying hemorrhage.  This may     simply represent a cyst with a complicated hemorrhage.  However,     hemorrhagic neoplasm cannot be excluded without IV contrast.     Presuming the patient is on dialysis, pre and postcontrast     abdominal CT using a renal mass protocol will be feasible.  If this     is not performed, further evaluation with a renal ultrasound to     allow ongoing surveillance is suggested by radiologist.  Findings     likely of chronic pancreatitis are also seen on this MRI due to     calcifications in the pancreas. 2. Hemosiderosis. 3. Renal disease on dialysis.  The patient also had a 2-D echo that was done on February 12, 2011, demonstrating left ventricular cavity size mildly dilated with wall thicknesses increased in severe left ventricular hypertrophy.  Systolic function was normal.  Estimated ejection fraction was in the range of 55% to 60%.  The aortic valve was representing a moderate stenosis with a trivial regurgitation.  Mitral valve was calcified.  Left atrium was moderately dilated.  Atrial septum, no defect or patent foramen ovale was identified.  Carotid Doppler were also done on February 12, 2011, that demonstrated no ICA stenosis with a vertebral artery flow antegrade.  No other procedures were performed during this admission until the moment of this dictation.  BRIEF HISTORY OF PRESENT ILLNESS: For full details, please refer to dictation done by Dr. Jomarie Longs on February 09, 2011, but briefly [name] is a very pleasant 70 year old African American gentleman with a past history of diabetes mellitus type 2, end- stage renal disease on hemodialysis, paroxysmal atrial fibrillation, and multiple other medical problems who came into the hospital secondary to progressive muscular weakness and numbness and frequent falls.  The patient reports that for the last 3 years approximately he has  had numbness in several fingers of his right hand, which have progressed to numbness of both of his hands now.  In addition, he had a stroke in 1999 that caused weakness in his left leg and about 3 years ago he started using a brace for his left lower leg because of a footdrop.  However, most recently in the last 2-3 months, he has noticed numbness in his right leg predominantly from his right calf down to his foot.  It is occasional with tingling, numbness and in addition also noted significant muscle weakness in his proximal and distal muscle groups of his legs.  He stated that he has always had weakness in the left leg ever since his stroke for things that could have potentially gone a little worse too in the  last couple of months.  In addition, he has been falling pretty frequently for the last couple of months at least one to two falls per week.  The patient reports incontinence of stool for the last couple of months and denies any overt pain on his back.  The patient reports slight weight loss, but no other significant symptoms were documented.  LABORATORY DATA: Pertinent laboratory data includes a PT 14.8, INR 1.4, PTT 38.  BMET on admission showed a sodium of 139, potassium 2.8, chloride 99, bicarb 34, glucose 91, BUN 11, creatinine 3.19.  CBC with a white blood cell of 5.0, hemoglobin 10.0, platelets 227.  MRSA/PCR was negative.  TSH 2.964. A lipid profile showed total cholesterol of 100, triglycerides 66, HDL of 49, LDL 38, magnesium 2.5.  Hemoglobin A1c 5.6.  Vitamin B12 of 1234. On the day of this dictation, renal function panel demonstrated a sodium of 134 with a potassium of 5.7, chloride 98, bicarb 27, glucose 92, BUN 58, creatinine 8.45, albumin 2.4, calcium 9.1, phosphorus 5.2.  CBC; white blood cell 4.7, hemoglobin 9.1, platelets 184.  PT 14.3, INR 1.09.  HOSPITAL COURSE: 1. Myelopathy with upper extremity weakness and bilateral lack of     coordination due to C3-C4,  C4-C5 spondylosis.  Plan is for a     laminectomy to be performed on February 15, 2011, by Dr. Venetia Maxon.  Plan     is for the patient to have this surgery and then transfer to the     Neuro ICU after the surgery is done.  We will continue following     the patient along with a renal and neurosurgery.  We will continue     using Neurontin.  The patient with instructions to be n.p.o. after     midnight. 2. End-stage renal disease per renal.  We will continue hemodialysis     on Tuesday, Thursday, and Saturday. 3. Anemia of chronic disease.  Plan is to continue using Aranesp and     also Venofer with his hemodialysis. 4. Atrial fibrillation.  At this point rate controlled and plan to     start the patient on aspirin and also Coumadin once the surgery is     done. 5. Aortic stenosis, moderate.  Plan is to continue current medications     and follow up in case that the patient has worsening of symptoms. 6. Secondary hyperparathyroidism.  We are going to continue using     Zemplar, Renvela, and Sensipar. 7. Diabetes, well controlled and stable.  We are going to continue     using a combination of sliding scale insulin and Lantus.  We are     going to be careful, especially on February 14, 2011, prior to surgery     to prevent hypoglycemic events. 8. Seizure disorder, stable.  We are going to continue using Keppra. 9. Neuropathy, most likely secondary to diabetic neuropathy with     coadjuvant causes his spondylosis causing compression of the     corner.  We are going to continue using Neurontin. 10.Hypertension, stable, well controlled.  We are going to continue     using labetalol. 11.Hyperkalemia.  On February 14, 2011, it was a pre-hemodialysis result     and it could be affected by hemolysis.  Electrolyte disturbances     are going to be managed by renal doctors and we are going to     correct it with his hemodialysis. 12.Renal lesion on the upper pole of  the right kidney, most likely     secondary  to a complex cyst.  Surveillance with ultrasound has been     recommended by a radiologist versus MRI with contrast prior to have     hemodialysis.  We are going to let the renal doctors to decide on     further workup if indicated. 13.Chronic constipation.  We are going to continue using p.r.n.     laxatives.  PHYSICAL EXAMINATION: VITAL SIGNS:  On the date of this dictation, temperature 98.4, heart rate 76, respiratory rate 18, blood pressure 124/77, oxygen saturation 97% on room air. GENERAL:  No acute distress. RESPIRATORY SYSTEM:  Clear to auscultation bilaterally. HEART:  With a positive systolic ejection murmur, 3/6 consistent with aortic stenosis.  S1 and S2 were auscultated.  There were no rubs heard. ABDOMEN:  Soft, nontender, nondistended with positive bowel sounds. EXTREMITIES:  Without any edema. NEUROLOGIC:  Unchanged.  CONSULTATIONS DONE DURING THIS HOSPITALIZATION: Renal was consulted for continuation of his hemodialysis.  Neurosurgery was consulted for normal findings and myelopathy and they are going to take care of this problem with a decompression laminectomy.  Cardiology was consulted for surgical clearance, specifically in this patient with atrial fibrillation and aortic stenosis.  Inpatient rehab has been already consulted for further assistance once the surgery is performed.     Rosanna Randy, MD     CEM/MEDQ  D:  02/14/2011  T:  02/14/2011  Job:  [id number]  Electronically Signed by Vassie Loll MD on 02/24/2011 04:42:08 PM

## 2011-02-25 ENCOUNTER — Inpatient Hospital Stay (HOSPITAL_COMMUNITY): Payer: Medicare Other

## 2011-02-25 LAB — GLUCOSE, CAPILLARY
Glucose-Capillary: 103 mg/dL — ABNORMAL HIGH (ref 70–99)
Glucose-Capillary: 140 mg/dL — ABNORMAL HIGH (ref 70–99)
Glucose-Capillary: 115 mg/dL — ABNORMAL HIGH (ref 70–99)

## 2011-02-25 LAB — RENAL FUNCTION PANEL
Albumin: 2.5 g/dL — ABNORMAL LOW (ref 3.5–5.2)
Calcium: 10.8 mg/dL — ABNORMAL HIGH (ref 8.4–10.5)
Creatinine, Ser: 7.29 mg/dL — ABNORMAL HIGH (ref 0.50–1.35)
GFR calc non Af Amer: 7 mL/min — ABNORMAL LOW (ref >60)
Sodium: 127 mEq/L — ABNORMAL LOW (ref 135–145)

## 2011-02-25 LAB — URINE CULTURE: Special Requests: NEGATIVE

## 2011-02-25 LAB — CBC
MCH: 28.4 pg (ref 26.0–34.0)
MCV: 84.4 fL (ref 78.0–100.0)
Platelets: 208 10*3/uL (ref 150–400)
RDW: 14.3 % (ref 11.5–15.5)
WBC: 4.9 10*3/uL (ref 4.0–10.5)

## 2011-02-26 LAB — GLUCOSE, CAPILLARY
Glucose-Capillary: 118 mg/dL — ABNORMAL HIGH (ref 70–99)
Glucose-Capillary: 157 mg/dL — ABNORMAL HIGH (ref 70–99)

## 2011-02-26 NOTE — Consult Note (Signed)
NAMEDARLY, FAILS                ACCOUNT NO.:  [id number]  MEDICAL RECORD NO.:  [id number]  LOCATION:  6713                         FACILITY:  [facility]  PHYSICIAN:  Jesse Sans. Khia Dieterich, MD, FACCDATE OF BIRTH:  01-13-1941  DATE OF CONSULTATION: DATE OF DISCHARGE:                                CONSULTATION   I was asked by Dr. Maeola Harman to offer preoperative clearance for Mr. Calabretta who needs a cervical decompression of C3 through C5 progressive upper extremity weakness as well as right lower extremity weakness.  HISTORY OF PRESENT ILLNESS:  Mr. Fout is a 70 year old African American gentleman who was admitted on February 09, 2011, with progressive weakness and numbness of his upper extremities.  It has been slowly going on for 3-4 years.  He has gotten to the point that he is becoming fairly immobile and falling frequently.  His right leg is also involved. He has had previous stroke that left him with residual left lower extremity weakness as well.  He has a history of chronic atrial fibrillation that was diagnosed he thinks in the mid 1990s.  He has been on Coumadin since that time. Unfortunately, his INR on admission as well as back in April 2012 was normal.  He admits to some noncompliance and missing some Coumadin checks.  He has had to move several times lately.  He also has a history of end-stage renal disease on hemodialysis.  He has insulin-dependent type 2 diabetes since the mid 1990s.  He has also had hyperlipidemia.  He does not smoke.  He currently is fairly inactive and gets up and goes to the bathroom only.  He has had no angina or ischemic symptoms.  He denies any orthopnea, PND, or peripheral edema.  He does not have any symptoms with his atrial fibrillation and never has.  PAST MEDICAL HISTORY:  In addition to above, he has a history of a stroke with residual left leg weakness since 1999.  He has anemia of chronic disease, secondary hyperparathyroidism, gout,  osteoarthritis, history of a seizure disorder.  MEDICATIONS ON ADMISSION: 1. Coumadin 7.5 mg per day. 2. Keppra 250 mg p.o. b.i.d. 3. Lantus 16 units at bedtime. 4. Humalog with meals. 5. Sorbitol 2 tablespoons every 3 hours p.r.n. 6. Renvela 800 mg 3 tablets t.i.d. before meals. 7. Allopurinol 100 mg daily. 8. Flomax 0.4 mg a day. 9. Sensipar 30 mg a day.  He has no known drug allergies.  SOCIAL HISTORY:  He is a former Runner, broadcasting/film/video of math.  He was born and raised in [place], [place].  He has lived in [place] a number of years when he was diagnosed with diabetes and atrial fibrillation.  He is single.  He is divorced.  He has 2 children.  No history of alcohol or drug abuse.  He does not smoke.  FAMILY HISTORY:  Remarkable for diabetes in both parents.  Mother died of renal failure and old age.  Father was deceased from a chronic neurological disorder which is poorly characterized.  REVIEW OF SYSTEMS:  He denies any melena, hematochezia, syncope, dyspnea on exertion.  All other review of systems were negative other than HPI.  PHYSICAL  EXAMINATION:  GENERAL:  He is a pleasant gentleman, in no acute distress.  He has gray hair and a gray beard. VITAL SIGNS:  Show blood pressure of 137/79, his pulse is 71, and he is in chronic atrial fibrillation.  EKG shows no acute changes and no sign of previous infarction.  Respirations 20, temperature is 98.6, sats 98% on room air. HEENT:  PERLA.  Extraocular movements intact.  Muddy sclerae.  Facial asymmetry is normal.  Dentition satisfactory. NECK:  Supple.  Carotids are delayed, but normal in amplitude.  He has bilateral referred sounds versus bruits.  Thyroid is not enlarged. Trachea is midline.  No adenopathy.  No significant JVD. HEART:  Reveals a sustained PMI.  There is no S4.  He has an aortic stenosis murmur and S2 does split.  No diastolic murmur was appreciated. LUNGS:  Clear to auscultation and percussion. ABDOMEN:  Soft,  good bowel sounds.  No tenderness. EXTREMITIES:  Revealed no edema.  There is no sign of DVT.  Pulses were 2+/4+ dorsalis pedis, posterior tibial were less, but were palpable. NEUROLOGIC:  Grossly intact. PSYCHIATRIC:  Normal affect.  LABORATORY DATA:  Reviewed.  Remarkably, his hemoglobin A1c was 5.6. His lipids showed a total of cholesterol 100, HDL of 49, LDL 38. Cardiac markers were negative x2.  INR was normal.  TSH was normal.  Chest x-ray shows no acute cardiopulmonary disease.  EKG is remarkable for chronic atrial fibrillation with well-controlled rate, but no sign of previous infarction.  He does have some degree of LVH.  ASSESSMENT: 1. Chronic atrial fibrillation which has been asymptomatic.  He is at     high risk of stroke with a very high CHADS2-VASc score and needs to     be therapeutic with his INR in the future.  I spent a long time     talking to him about this today which is a major threat to his     health.  We can make this arrangement at [facility] through     our Coumadin Clinic which he seems to agree with. 2. Subclinical coronary artery disease, particularly his long history     of diabetes and other risk factors.  He probably has normal left     ventricular systolic function, but this needs to be assessed with     an echocardiogram prior to surgery. 3. Aortic stenosis by exam, is at least mild, it may be moderate.     This can also be assessed by 2D echocardiogram. 4. Carotid bruits versus eminence of his aortic stenosis murmur to his     neck.  He needs carotid Dopplers to sort this out as well. 5. Noncompliance which is a major problem as listed under #1. 6. Hemodialysis, chronic renal failure. 7. Insulin-dependent diabetes for a number of years. 8. History of cerebrovascular accident of the left leg.  I would recommend, as I mentioned above, Carotid Dopplers and a 2D echocardiogram prior to surgery.  If this only shows mild-to-moderate aortic  stenosis and no significant obstructive carotid disease, I will clear him for surgery at a moderate risk secondary to multiple comorbidities.  I have discussed this at length with him as well as his family.  I think he does need to have the surgery done obviously, he will become progressively worse and more dysfunctional with his weakness.  After his surgery, he needs to go home on aspirin 81 mg a day, INR of 2- 3 on Coumadin with close  followup in our clinic with his INR postdischarge.  I note that he is not on a statin, but his lipids are remarkably low. There must be some degree of malnutrition here as well.  We note also that his hemoglobin A1c is remarkably good which I reinforced with him today.  Thank you very much for consultation.     Terrion Poblano C. Daleen Squibb, MD, [facility]     TCW/MEDQ  D:  02/11/2011  T:  02/12/2011  Job:  [id number]  Electronically Signed by Valera Castle MD [facility] on 02/26/2011 04:31:17 PM

## 2011-02-27 DIAGNOSIS — Z5189 Encounter for other specified aftercare: Secondary | ICD-10-CM

## 2011-02-27 DIAGNOSIS — M4712 Other spondylosis with myelopathy, cervical region: Secondary | ICD-10-CM

## 2011-02-27 DIAGNOSIS — N186 End stage renal disease: Secondary | ICD-10-CM

## 2011-02-27 DIAGNOSIS — G8250 Quadriplegia, unspecified: Secondary | ICD-10-CM

## 2011-02-27 DIAGNOSIS — I4891 Unspecified atrial fibrillation: Secondary | ICD-10-CM

## 2011-02-27 DIAGNOSIS — E1165 Type 2 diabetes mellitus with hyperglycemia: Secondary | ICD-10-CM

## 2011-02-27 LAB — GLUCOSE, CAPILLARY
Glucose-Capillary: 93 mg/dL (ref 70–99)
Glucose-Capillary: 96 mg/dL (ref 70–99)
Glucose-Capillary: 115 mg/dL — ABNORMAL HIGH (ref 70–99)

## 2011-02-27 LAB — CBC
MCV: 84.6 fL (ref 78.0–100.0)
Platelets: 200 10*3/uL (ref 150–400)
RBC: 2.85 MIL/uL — ABNORMAL LOW (ref 4.22–5.81)
WBC: 6 10*3/uL (ref 4.0–10.5)

## 2011-02-28 ENCOUNTER — Inpatient Hospital Stay (HOSPITAL_COMMUNITY): Payer: Medicare Other

## 2011-02-28 DIAGNOSIS — M4712 Other spondylosis with myelopathy, cervical region: Secondary | ICD-10-CM

## 2011-02-28 DIAGNOSIS — G8250 Quadriplegia, unspecified: Secondary | ICD-10-CM

## 2011-02-28 DIAGNOSIS — E1165 Type 2 diabetes mellitus with hyperglycemia: Secondary | ICD-10-CM

## 2011-02-28 DIAGNOSIS — N186 End stage renal disease: Secondary | ICD-10-CM

## 2011-02-28 DIAGNOSIS — Z5189 Encounter for other specified aftercare: Secondary | ICD-10-CM

## 2011-02-28 DIAGNOSIS — IMO0001 Reserved for inherently not codable concepts without codable children: Secondary | ICD-10-CM

## 2011-02-28 LAB — GLUCOSE, CAPILLARY
Glucose-Capillary: 135 mg/dL — ABNORMAL HIGH (ref 70–99)
Glucose-Capillary: 119 mg/dL — ABNORMAL HIGH (ref 70–99)

## 2011-02-28 LAB — RENAL FUNCTION PANEL
Albumin: 2.6 g/dL — ABNORMAL LOW (ref 3.5–5.2)
Calcium: 10.8 mg/dL — ABNORMAL HIGH (ref 8.4–10.5)
GFR calc Af Amer: 7 mL/min — ABNORMAL LOW (ref >60)
Phosphorus: 6.5 mg/dL — ABNORMAL HIGH (ref 2.3–4.6)
Potassium: 5.3 mEq/L — ABNORMAL HIGH (ref 3.5–5.1)
Sodium: 130 mEq/L — ABNORMAL LOW (ref 135–145)

## 2011-02-28 LAB — CBC
Platelets: 193 10*3/uL (ref 150–400)
RBC: 2.86 MIL/uL — ABNORMAL LOW (ref 4.22–5.81)
RDW: 14.2 % (ref 11.5–15.5)
WBC: 5.3 10*3/uL (ref 4.0–10.5)

## 2011-02-28 LAB — PROTIME-INR
INR: 1.16 (ref 0.00–1.49)
Prothrombin Time: 15 seconds (ref 11.6–15.2)

## 2011-03-01 DIAGNOSIS — M4712 Other spondylosis with myelopathy, cervical region: Secondary | ICD-10-CM

## 2011-03-01 DIAGNOSIS — N186 End stage renal disease: Secondary | ICD-10-CM

## 2011-03-01 DIAGNOSIS — G8250 Quadriplegia, unspecified: Secondary | ICD-10-CM

## 2011-03-01 DIAGNOSIS — E1165 Type 2 diabetes mellitus with hyperglycemia: Secondary | ICD-10-CM

## 2011-03-01 DIAGNOSIS — Z5189 Encounter for other specified aftercare: Secondary | ICD-10-CM

## 2011-03-01 LAB — GLUCOSE, CAPILLARY
Glucose-Capillary: 112 mg/dL — ABNORMAL HIGH (ref 70–99)
Glucose-Capillary: 107 mg/dL — ABNORMAL HIGH (ref 70–99)

## 2011-03-01 LAB — PROTIME-INR: INR: 1.18 (ref 0.00–1.49)

## 2011-03-02 ENCOUNTER — Inpatient Hospital Stay (HOSPITAL_COMMUNITY): Payer: Medicare Other

## 2011-03-02 LAB — CBC
Hemoglobin: 8.5 g/dL — ABNORMAL LOW (ref 13.0–17.0)
MCH: 27.8 pg (ref 26.0–34.0)
MCHC: 33.2 g/dL (ref 30.0–36.0)

## 2011-03-02 LAB — GLUCOSE, CAPILLARY: Glucose-Capillary: 129 mg/dL — ABNORMAL HIGH (ref 70–99)

## 2011-03-02 LAB — RENAL FUNCTION PANEL
Calcium: 11 mg/dL — ABNORMAL HIGH (ref 8.4–10.5)
Creatinine, Ser: 7.48 mg/dL — ABNORMAL HIGH (ref 0.50–1.35)
GFR calc Af Amer: 9 mL/min — ABNORMAL LOW (ref >60)
Glucose, Bld: 112 mg/dL — ABNORMAL HIGH (ref 70–99)
Phosphorus: 5.2 mg/dL — ABNORMAL HIGH (ref 2.3–4.6)
Sodium: 132 mEq/L — ABNORMAL LOW (ref 135–145)

## 2011-03-02 LAB — PROTIME-INR: Prothrombin Time: 15.3 seconds — ABNORMAL HIGH (ref 11.6–15.2)

## 2011-03-03 DIAGNOSIS — I498 Other specified cardiac arrhythmias: Secondary | ICD-10-CM

## 2011-03-03 DIAGNOSIS — E1165 Type 2 diabetes mellitus with hyperglycemia: Secondary | ICD-10-CM

## 2011-03-03 DIAGNOSIS — N186 End stage renal disease: Secondary | ICD-10-CM

## 2011-03-03 DIAGNOSIS — Z5189 Encounter for other specified aftercare: Secondary | ICD-10-CM

## 2011-03-03 DIAGNOSIS — M4712 Other spondylosis with myelopathy, cervical region: Secondary | ICD-10-CM

## 2011-03-03 DIAGNOSIS — G8250 Quadriplegia, unspecified: Secondary | ICD-10-CM

## 2011-03-03 LAB — OCCULT BLOOD X 1 CARD TO LAB, STOOL: Fecal Occult Bld: POSITIVE

## 2011-03-03 LAB — GLUCOSE, CAPILLARY
Glucose-Capillary: 77 mg/dL (ref 70–99)
Glucose-Capillary: 100 mg/dL — ABNORMAL HIGH (ref 70–99)

## 2011-03-03 LAB — PROTIME-INR: INR: 1.37 (ref 0.00–1.49)

## 2011-03-04 ENCOUNTER — Inpatient Hospital Stay (HOSPITAL_COMMUNITY): Payer: Medicare Other

## 2011-03-04 LAB — GLUCOSE, CAPILLARY
Glucose-Capillary: 77 mg/dL (ref 70–99)
Glucose-Capillary: 114 mg/dL — ABNORMAL HIGH (ref 70–99)
Glucose-Capillary: 103 mg/dL — ABNORMAL HIGH (ref 70–99)

## 2011-03-04 LAB — RENAL FUNCTION PANEL
BUN: 44 mg/dL — ABNORMAL HIGH (ref 6–23)
CO2: 29 mEq/L (ref 19–32)
Chloride: 100 mEq/L (ref 96–112)
Creatinine, Ser: 5.12 mg/dL — ABNORMAL HIGH (ref 0.50–1.35)
GFR calc non Af Amer: 11 mL/min — ABNORMAL LOW (ref >60)
Glucose, Bld: 127 mg/dL — ABNORMAL HIGH (ref 70–99)

## 2011-03-04 LAB — PROTIME-INR: INR: 1.47 (ref 0.00–1.49)

## 2011-03-04 LAB — CBC
MCH: 28 pg (ref 26.0–34.0)
Platelets: 230 10*3/uL (ref 150–400)

## 2011-03-05 LAB — GLUCOSE, CAPILLARY: Glucose-Capillary: 114 mg/dL — ABNORMAL HIGH (ref 70–99)

## 2011-03-05 LAB — PROTIME-INR: INR: 1.83 — ABNORMAL HIGH (ref 0.00–1.49)

## 2011-03-06 DIAGNOSIS — G8250 Quadriplegia, unspecified: Secondary | ICD-10-CM

## 2011-03-06 DIAGNOSIS — M4712 Other spondylosis with myelopathy, cervical region: Secondary | ICD-10-CM

## 2011-03-06 DIAGNOSIS — Z5189 Encounter for other specified aftercare: Secondary | ICD-10-CM

## 2011-03-06 DIAGNOSIS — E1165 Type 2 diabetes mellitus with hyperglycemia: Secondary | ICD-10-CM

## 2011-03-06 DIAGNOSIS — N186 End stage renal disease: Secondary | ICD-10-CM

## 2011-03-06 LAB — GLUCOSE, CAPILLARY: Glucose-Capillary: 108 mg/dL — ABNORMAL HIGH (ref 70–99)

## 2011-03-06 LAB — PROTIME-INR: Prothrombin Time: 22.3 seconds — ABNORMAL HIGH (ref 11.6–15.2)

## 2011-03-07 ENCOUNTER — Inpatient Hospital Stay (HOSPITAL_COMMUNITY): Payer: Medicare Other

## 2011-03-07 DIAGNOSIS — E1165 Type 2 diabetes mellitus with hyperglycemia: Secondary | ICD-10-CM

## 2011-03-07 DIAGNOSIS — Z5189 Encounter for other specified aftercare: Secondary | ICD-10-CM

## 2011-03-07 DIAGNOSIS — N186 End stage renal disease: Secondary | ICD-10-CM

## 2011-03-07 DIAGNOSIS — M4712 Other spondylosis with myelopathy, cervical region: Secondary | ICD-10-CM

## 2011-03-07 DIAGNOSIS — G8250 Quadriplegia, unspecified: Secondary | ICD-10-CM

## 2011-03-07 LAB — CBC
HCT: 28.2 % — ABNORMAL LOW (ref 39.0–52.0)
RDW: 14.4 % (ref 11.5–15.5)
WBC: 5.2 10*3/uL (ref 4.0–10.5)

## 2011-03-07 LAB — RENAL FUNCTION PANEL
Albumin: 2.8 g/dL — ABNORMAL LOW (ref 3.5–5.2)
BUN: 82 mg/dL — ABNORMAL HIGH (ref 6–23)
Chloride: 91 mEq/L — ABNORMAL LOW (ref 96–112)
GFR calc Af Amer: 7 mL/min — ABNORMAL LOW (ref >60)
GFR calc non Af Amer: 6 mL/min — ABNORMAL LOW (ref >60)
Potassium: 4.9 mEq/L (ref 3.5–5.1)
Sodium: 131 mEq/L — ABNORMAL LOW (ref 135–145)

## 2011-03-07 LAB — GLUCOSE, CAPILLARY
Glucose-Capillary: 116 mg/dL — ABNORMAL HIGH (ref 70–99)
Glucose-Capillary: 126 mg/dL — ABNORMAL HIGH (ref 70–99)
Glucose-Capillary: 82 mg/dL (ref 70–99)
Glucose-Capillary: 83 mg/dL (ref 70–99)

## 2011-03-08 DIAGNOSIS — Z5189 Encounter for other specified aftercare: Secondary | ICD-10-CM

## 2011-03-08 DIAGNOSIS — G8250 Quadriplegia, unspecified: Secondary | ICD-10-CM

## 2011-03-08 DIAGNOSIS — M4712 Other spondylosis with myelopathy, cervical region: Secondary | ICD-10-CM

## 2011-03-08 DIAGNOSIS — N186 End stage renal disease: Secondary | ICD-10-CM

## 2011-03-08 DIAGNOSIS — I498 Other specified cardiac arrhythmias: Secondary | ICD-10-CM

## 2011-03-08 DIAGNOSIS — E1165 Type 2 diabetes mellitus with hyperglycemia: Secondary | ICD-10-CM

## 2011-03-08 LAB — GLUCOSE, CAPILLARY: Glucose-Capillary: 109 mg/dL — ABNORMAL HIGH (ref 70–99)

## 2011-03-09 ENCOUNTER — Inpatient Hospital Stay (HOSPITAL_COMMUNITY): Payer: Medicare Other

## 2011-03-09 DIAGNOSIS — I498 Other specified cardiac arrhythmias: Secondary | ICD-10-CM

## 2011-03-09 LAB — GLUCOSE, CAPILLARY
Glucose-Capillary: 73 mg/dL (ref 70–99)
Glucose-Capillary: 105 mg/dL — ABNORMAL HIGH (ref 70–99)
Glucose-Capillary: 117 mg/dL — ABNORMAL HIGH (ref 70–99)

## 2011-03-09 LAB — PROTIME-INR: Prothrombin Time: 27.4 seconds — ABNORMAL HIGH (ref 11.6–15.2)

## 2011-03-09 NOTE — H&P (Signed)
NAME:  [name], [name] NO.:  [id number]  MEDICAL RECORD NO.:  [id number]  LOCATION:  [facility]                         FACILITY:  [facility]  PHYSICIAN:  Zannie Cove, MD     DATE OF BIRTH:  1940-10-08  DATE OF ADMISSION:  02/09/2011 DATE OF DISCHARGE:                             HISTORY & PHYSICAL   NEPHROLOGIST:  Llana Aliment. Deterding, M.D.  HISTORY OF PRESENT ILLNESS:  [name] is a very pleasant 70 year old African American gentleman with past history of type 2 diabetes, end- stage renal disease on hemodialysis, paroxysmal atrial fibrillation, and multiple other medical problems.  He is here due to progressive muscular weakness and numbness and frequent falls.  The patient reports that for a 3-4 years, he has had numbness in several fingers of his right hand which progressed to numbness of both of his hands.  In addition, he had a stroke in 1999 that caused weakness in his left leg and about 3 years ago he started using a brace for his left lower leg because of a foot drop.  However, most recently in the last 2-3 months, he has noticed numbness in his right leg predominantly from his right calf down to his foot.  This is occasionally with tingling, numbness and in addition also noted significant muscle weakness in his proximal and distal muscle groups of his legs.  He says that he has always had weakness in his left leg ever since his stroke, but thinks that could have potentially gotten a little worse too in the last couple of months.  In addition, he has been falling pretty frequently for the last couple of months at least 1 to 2 falls per week.  In addition, he also reports incontinence of stool for the last couple of months.  He denies any overt pain is his back. He reports slight weight loss.  No other significant symptoms to report.  PAST MEDICAL HISTORY:  Significant for 1. End-stage renal disease on hemodialysis Tuesday, Thursday, and     Saturday. 2.  Paroxysmal atrial fibrillation, on Coumadin. 3. History of CVA with residual left leg weakness since 1999. 4. Dyslipidemia. 5. Anemia of chronic disease. 6. Secondary hyperparathyroidism. 7. History of malignant neoplasm of the kidney treated with     radioablation 7 years ago.  In addition, he has also had bilateral     compartment releases for both his legs over 10 years ago. 8. Longstanding type 2 diabetes, on insulin. 9. Gout. 10.Osteoarthritis. 11.Seizure disorder. 12.History of CVA.Marland Kitchen  MEDICATIONS: 1. Coumadin 7.5 mg daily. 2. Keppra 250 mg p.o. b.i.d. 3. Lantus 16 units at bedtime. 4. Humalog with meals. 5. Sorbitol two tablespoons every 3 hours p.r.n. 6. Renvela 800 mg 3 tablets t.i.d. a.c. 7. Humalog 5 units t.i.d. a.c. 8. Allopurinol 100 mg daily. 9. Flomax 0.4 mg daily. 10.Sensipar 30 mg daily.  ALLERGIES:  No known drug allergies.  SOCIAL HISTORY:  Single.  Divorced.  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  Currently lives with a couple in an apartment.  Also has two children.  No history of alcohol or drug use or smoking.  FAMILY  HISTORY:  Diabetes in both parents.  Father deceased from neurological disorder and mother deceased from kidney problems and old age.  REVIEW OF SYSTEMS:  Negative except per HPI.  PHYSICAL EXAMINATION:  VITAL SIGNS:  Temperature is 98.5, pulse is 62, blood pressure 152/80, respirations 18, satting 100% on 2 liters. GENERAL:  This is a very pleasant African American gentleman lying in the stretcher in no acute distress. HEENT:  Pupils are 4 mm reactive to light.  Extraocular movements are intact.  Oral mucosa is moist and pink. NECK:  No JVD or lymphadenopathy. CARDIOVASCULAR:  S1 and S2, regular rate and rhythm. LUNGS:  Clear to auscultation bilaterally. ABDOMEN:  Soft, nontender with normal bowel sounds.  No organomegaly. EXTREMITIES:  No edema, clubbing, or cyanosis. NEUROLOGIC:  Higher  functions intact.  Cranial nerves II through XII grossly intact.  Motor strength in upper extremities strength is 4/5 in both upper extremities.  Sensations, distal light touch and pinprick decreased in his hands.  Motor strength of the lower extremities, left lower extremity strength is 1-2/5 and right lower extremity strength is 3/5.  Sensations, both pinprick and light touch decreased in lower leg as well as his entire leg, predominantly lower leg and both feet. Reflexes are depressed in both, 1+ in right lower extremity and absent in his left leg.  Plantars are withdrawal.  Coordination, knee-to-shin test is difficult to assess.  Gait not assessed.  LABORATORY DATA:  Review of laboratory data shows white count of 5.0, hemoglobin 10.0, platelets 223.  Chemistries, sodium 139, potassium 2.8, chloride 99, bicarb 34, BUN 11, creatinine 3.1, glucose 91, calcium 9.0, INR 1.1.  DIAGNOSTICS:  Chest x-ray shows no acute cardiopulmonary disease.  CT of the head shows no intracranial hemorrhage or evidence of large acute infarct, mild small vessel disease type changes.  ASSESSMENT AND PLAN:  [name] is a 70 year old gentleman with,1. Progressive weakness and numbness in both legs, concern for     myelopathy 2. Type 2 diabetes neuropathy. 3. End-stage renal disease, on hemodialysis. 4. Frequent falls. 5. Paroxysmal atrial fibrillation. 6. Radiofrequency ablation of right kidney malignancy.  PLAN:  I suspect that he does have signs and symptoms of diabetic peripheral neuropathy.  However, he does have proximal muscle weakness, which does not go long with this, hence I will get a MRI of cervical and LS spine to evaluate for myelopathy.  In the meantime, continue his home medications for diabetes.  Continue his Coumadin for paroxysmal atrial fibrillation and notify renal and get a PT evaluation.  Further management as condition evolves.     Zannie Cove, MD     PJ/MEDQ  D:   02/09/2011  T:  02/09/2011  Job:  [id number]  cc:   Fayrene Fearing L. Deterding, M.D.  Electronically Signed by Zannie Cove  on 03/09/2011 05:11:12 PM

## 2011-03-10 LAB — RENAL FUNCTION PANEL
Albumin: 2.7 g/dL — ABNORMAL LOW (ref 3.5–5.2)
CO2: 29 mEq/L (ref 19–32)
Calcium: 9.6 mg/dL (ref 8.4–10.5)
Chloride: 92 mEq/L — ABNORMAL LOW (ref 96–112)
Creatinine, Ser: 7.58 mg/dL — ABNORMAL HIGH (ref 0.50–1.35)
GFR calc Af Amer: 9 mL/min — ABNORMAL LOW (ref >60)
GFR calc non Af Amer: 7 mL/min — ABNORMAL LOW (ref >60)
Sodium: 131 mEq/L — ABNORMAL LOW (ref 135–145)

## 2011-03-10 LAB — PROTIME-INR: INR: 2.11 — ABNORMAL HIGH (ref 0.00–1.49)

## 2011-03-10 LAB — GLUCOSE, CAPILLARY
Glucose-Capillary: 102 mg/dL — ABNORMAL HIGH (ref 70–99)
Glucose-Capillary: 74 mg/dL (ref 70–99)
Glucose-Capillary: 67 mg/dL — ABNORMAL LOW (ref 70–99)
Glucose-Capillary: 69 mg/dL — ABNORMAL LOW (ref 70–99)

## 2011-03-10 LAB — CBC
Platelets: 230 10*3/uL (ref 150–400)
RBC: 3.32 MIL/uL — ABNORMAL LOW (ref 4.22–5.81)
RDW: 14.7 % (ref 11.5–15.5)
WBC: 4.4 10*3/uL (ref 4.0–10.5)

## 2011-03-11 ENCOUNTER — Inpatient Hospital Stay (HOSPITAL_COMMUNITY): Payer: Medicare Other

## 2011-03-11 DIAGNOSIS — N186 End stage renal disease: Secondary | ICD-10-CM

## 2011-03-11 DIAGNOSIS — M4712 Other spondylosis with myelopathy, cervical region: Secondary | ICD-10-CM

## 2011-03-11 DIAGNOSIS — E1165 Type 2 diabetes mellitus with hyperglycemia: Secondary | ICD-10-CM

## 2011-03-11 DIAGNOSIS — G8250 Quadriplegia, unspecified: Secondary | ICD-10-CM

## 2011-03-11 DIAGNOSIS — Z5189 Encounter for other specified aftercare: Secondary | ICD-10-CM

## 2011-03-11 LAB — GLUCOSE, CAPILLARY
Glucose-Capillary: 160 mg/dL — ABNORMAL HIGH (ref 70–99)
Glucose-Capillary: 85 mg/dL (ref 70–99)

## 2011-03-11 LAB — PROTIME-INR: Prothrombin Time: 26.7 seconds — ABNORMAL HIGH (ref 11.6–15.2)

## 2011-03-12 LAB — GLUCOSE, CAPILLARY
Glucose-Capillary: 75 mg/dL (ref 70–99)
Glucose-Capillary: 141 mg/dL — ABNORMAL HIGH (ref 70–99)

## 2011-03-13 DIAGNOSIS — Z5189 Encounter for other specified aftercare: Secondary | ICD-10-CM

## 2011-03-13 DIAGNOSIS — E1165 Type 2 diabetes mellitus with hyperglycemia: Secondary | ICD-10-CM

## 2011-03-13 DIAGNOSIS — M4712 Other spondylosis with myelopathy, cervical region: Secondary | ICD-10-CM

## 2011-03-13 DIAGNOSIS — N186 End stage renal disease: Secondary | ICD-10-CM

## 2011-03-13 DIAGNOSIS — G8250 Quadriplegia, unspecified: Secondary | ICD-10-CM

## 2011-03-13 LAB — GLUCOSE, CAPILLARY: Glucose-Capillary: 117 mg/dL — ABNORMAL HIGH (ref 70–99)

## 2011-03-13 LAB — POCT I-STAT 4, (NA,K, GLUC, HGB,HCT)
Glucose, Bld: 105 mg/dL — ABNORMAL HIGH (ref 70–99)
Hemoglobin: 9.9 g/dL — ABNORMAL LOW (ref 13.0–17.0)
Potassium: 4.4 mEq/L (ref 3.5–5.1)
Sodium: 135 mEq/L (ref 135–145)

## 2011-03-13 LAB — PROTIME-INR: Prothrombin Time: 26.9 seconds — ABNORMAL HIGH (ref 11.6–15.2)

## 2011-03-14 ENCOUNTER — Inpatient Hospital Stay (HOSPITAL_COMMUNITY): Payer: Medicare Other

## 2011-03-14 LAB — RENAL FUNCTION PANEL
Albumin: 2.7 g/dL — ABNORMAL LOW (ref 3.5–5.2)
Chloride: 97 mEq/L (ref 96–112)
GFR calc Af Amer: 8 mL/min — ABNORMAL LOW (ref >60)
Phosphorus: 3.8 mg/dL (ref 2.3–4.6)
Potassium: 4.3 mEq/L (ref 3.5–5.1)
Sodium: 136 mEq/L (ref 135–145)

## 2011-03-14 LAB — PROTIME-INR
INR: 3.13 — ABNORMAL HIGH (ref 0.00–1.49)
Prothrombin Time: 32.7 seconds — ABNORMAL HIGH (ref 11.6–15.2)

## 2011-03-14 LAB — CBC
HCT: 30.7 % — ABNORMAL LOW (ref 39.0–52.0)
Hemoglobin: 10.1 g/dL — ABNORMAL LOW (ref 13.0–17.0)
MCHC: 32.9 g/dL (ref 30.0–36.0)
RDW: 14.7 % (ref 11.5–15.5)
WBC: 5.2 10*3/uL (ref 4.0–10.5)

## 2011-03-14 LAB — GLUCOSE, CAPILLARY: Glucose-Capillary: 77 mg/dL (ref 70–99)

## 2011-03-15 NOTE — Op Note (Signed)
  NAMEDAMAR, PETIT NO.:  [id number]  MEDICAL RECORD NO.:  [id number]  LOCATION:  3114                         FACILITY:  [facility]  PHYSICIAN:  Danae Orleans. Venetia Maxon, M.D.  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  PROCEDURE:  C3-C6 posterior cervical laminectomy for spinal stenosis.  SURGEON:  Danae Orleans. Venetia Maxon, MD  ASSISTANT:  Georgiann Cocker, RN and Hilda Lias, MD  ANESTHESIA:  General endotracheal anesthesia.  ESTIMATED BLOOD LOSS:  Minimal.  COMPLICATIONS:  None.  DISPOSITION:  To recovery.  INDICATIONS:  [name] is a 70 year old man with end-stage renal disease on dialysis on Coumadin with a progressive severe myelopathy with severe spinal cord compression C3-C6 levels.  It was elected to take her to the surgery for posterior cervical decompression of these affected levels.  PROCEDURE IN DETAIL:  [name] was brought to the operating room. Following satisfactory and uncomplicated induction of general endotracheal anesthesia with glide scope in neutral alignment, he was placed in a cervical collar, turned, and placed in three-pinhead fixation, turned in prone position on the operating table.  Great care was taken to maintain his neck in neutral alignment.  Shoulders were taped down.  His posterior neck was then shaved, then prepped and draped in usual sterile fashion.  Area of planned incision was infiltrated with local lidocaine.  Incision was made in midline to the level of C7 from C2, carried through the avascular midline plane exposing the C2 through C6 spinous processes.  Subperiosteal dissection was  performed exposing the C3 through C6 levels.  After confirmatory radiograph marker probes on C2 and C3, a total laminectomy of C3, C4, C5, and C6 was then performed with Leksell rongeur and high-speed drill and then along with 2 and 3 mm gold tip Kerrison rongeurs.  The spinal cord dura was decompressed broadly from C3 through C6 levels along with lateral recess.  Hemostasis was assured.  A #10 JP drain was inserted through separate stab incision, anchored with a nylon stitch.  The fascia was closed with 0 Vicryl sutures, subcutaneous tissues were approximated with 2-0 Vicryl in interrupted inverted sutures.  Skin edges were reapproximated with 3-0 Vicryl subcuticular stitch, and then staples. The wound was irrigated with copious saline prior to closure and hemostasis was assured.  Sterile occlusive dressing was placed.  The patient was returned to supine position in the OR gurney and then taken out of three- pinhead fixation, extubated in the operating room, taken to the recovery having tolerated the procedure well.     Danae Orleans. Venetia Maxon, M.D.     JDS/MEDQ  D:  02/15/2011  T:  02/16/2011  Job:  [id number]  Electronically Signed by Maeola Harman M.D. on 03/15/2011 01:25:25 PM

## 2011-03-21 NOTE — H&P (Signed)
[name], [name] NO.:  [id number]  MEDICAL RECORD NO.:  [id number]  LOCATION:  4011                         FACILITY:  [facility]  PHYSICIAN:  Ranelle Oyster, M.D.DATE OF BIRTH:  10-09-1940  DATE OF ADMISSION:  02/20/2011 DATE OF DISCHARGE:                             HISTORY & PHYSICAL   CHIEF COMPLAINT:  Upper and lower extremity weakness.  HISTORY OF PRESENT ILLNESS:  This is a 70 year old African American male with diabetes, end-stage renal disease, admitted with progressive weakness and numbness in his bilateral upper extremities and lower extremities with falls over the last 5 months.  He had a history of prior right-sided stroke with left hemiparesis in the past.  MRI of the cervical lumbar spine showed advanced cervical spondylosis with cord deformity and abnormal signal at C3-C4 and C4-C5 with moderate disk bulges as well than in lumbar spine.  He was evaluated by Dr. Venetia Maxon and the patient underwent a C3-C6 posterior cervical decompression on February 15, 2011.  Cardiology has been following this patient for preoperative clearance.  A 2D echo showed ejection fraction of 55-60%.  Carotid Dopplers were negative.  MRI showed a complex lesion in the right kidney, chronic pancreatitis, and hemosiderosis.  The patient was evaluated by the Rehab Team and we felt he could benefit from an inpatient rehab stay.  REVIEW OF SYSTEMS:  Notable for spasms on the right as well as numbness in both feet and hands.  He has had some urinary frequency and incontinence of the bladder which he feels due to his inability to get to the toilet at this time.  He has had some issues with bowel incontinence as well.  Full 12-point review is in the written H and P.  PAST MEDICAL HISTORY:  Positive for PAF, diabetes type 2, CVA with left upper and lower extremity weakness, and footdrop since 1999, gout, seizure disorder, bilateral lower extremity fasciotomy, end-stage  renal disease on hemodialysis since 2005, anemia of chronic disease, hyperparathyroidism, history of renal cancer, obstructive sleep apnea, and hypertension.  FAMILY HISTORY:  Positive for diabetes, end-stage renal disease.  SOCIAL HISTORY:  The patient is a retired Runner, broadcasting/film/video.  He has been staying with church friends recently.  He does not smoke and quit drinking years ago.  He has no steps to enter.  ALLERGIES:  None.  HOME MEDICATIONS:  Include Coumadin, Keppra, allopurinol, NovoLog, Lantus, nitroglycerin, Sensipar, Flomax, and Nephrocaps.  LABS:  Hemoglobin 8.7, white count 5.8.  Please see full details in the written H and P.  PHYSICAL EXAMINATION:  GENERAL:  The patient is generally pleasant, in no acute stress. HEENT:  Pupils equally, round, and reactive to light.  Nose and throat exam notable for borderline dentition.  Pink moist mucosa.  He has some thrush over the tongue. NECK:  Supple without JVD or lymphadenopathy.  Incision was clean and intact with staples.  He had the drain site which was opened and appeared to be having some mild serous drainage. CHEST:  Clear to auscultation bilaterally without wheezes, rales, or rhonchi. HEART:  Irregular rate and rhythm with murmur. EXTREMITIES:  Showed no clubbing, cyanosis, or edema. ABDOMEN:  Soft, nontender.  Bowel  sounds are positive. NEUROLOGIC:  Cranial nerves II-XII were grossly intact.  Reflexes were diminished throughout and sensation was diminished in the arms and legs, but more extended in the hands and feet on both sides.  The thumb side of the hand was more involved on each upper extremity.  He definitely has more sensory loss below the ankle on either leg.  Strength was 3+- 4/5 shoulders, biceps, triceps, wrist, and hands.  It was a bit inconsistent.  Hand intrinsics may have been any closer to 3/5. Shoulder was a bit weaker on the right side, but biceps, triceps were stronger on the right side also.  Lower  extremity strength was trace to 1/5 hips and knees.  He has absent ankle dorsiflexion, plantar flexion on the left and had 1-1+ at the right side.  Judgment, orientation, memory, and mood were all appropriate.  POST ADMISSION PHYSICIAN EVALUATION: 1. Functional deficit secondary to cervical myelopathy with     tetraplegia.  The patient with premorbid left-sided hemiparesis     from a prior stroke as well as likely peripheral neuropathy. 2. The patient was admitted to receive collaborative interdisciplinary     care between the physiatrist, rehab nursing staff, and therapy     team. 3. The patient's level of medical complexity and substantial therapy     needs in context of that medical necessity cannot be provided at a     lesser intensity of care. 4. The patient has experienced substantial functional loss from his     baseline.  Premorbidly, he was independent, but using a     walker/cane, but was declining from a mobility standpoint.     Currently, he is total assist 40% for bed mobility, total assist to     stand 2-3 minutes, min assist feeding, max assist grooming, total     assist toileting.  Judging by the patient's diagnosis, physical     exam, and functional history, he has potential for functional     progress which will result in measurable gains while in inpatient     rehab.  These gains will be of substantial and practical use upon     discharge home, facilitating mobility and self-care. 5. The physiatrist will provide 24-hour management of medical needs as     well as oversight of therapy plan/treatment and provide guidance as     appropriate regarding interaction of two.  Medical problems and     plan are below. 6. A 24-hour rehab nursing team will assist in the management of the     patient's skin care needs as well as bowel and bladder function,     safety awareness, integration therapy, concepts, techniques, pain,     etc. 7. PT will assess and treat for lower  extremity strength, range of     motion, adaptive techniques, equipment, functional mobility,     safety, neuromuscular education with goals modified independent to     occasional min assist for basic mobility.  He may be more of a     wheelchair level for functional movement at discharge. 8. OT will assess and treat for upper extremity use, ADLs, adaptive     techniques, functional mobility, safety.  Family and caregiver     education with goals modified independent to min assist. 9. Case management and social worker will assess and treat for     psychosocial issues and discharge planning. 10.Team conference will be held weekly to assess progress towards  goals and to determine barriers at discharge. 11.The patient has demonstrated sufficient medical stability and     exercise capacity to tolerate at least 3 hours therapy per day at     least 5 days per week. 12.Estimated length of stay is approximately 3-4 weeks.  Prognosis is     good.  MEDICAL PROBLEM LIST AND PLAN: 1. Deep vein thrombosis prophylaxis with SCDs.  Needs to look at     starting Lovenox and resuming Coumadin also for his atrial     fibrillation.  We need to discuss with Surgery going forward. 2. Pain management, p.r.n. oxycodone.  Lyrica is added also yesterday    for dysesthesias.  We will monitor and titrate this to need.  He is     currently receiving 50 mg b.i.d. at this point.  Some of his     symptoms are likely related to his peripheral neuropathy and carpal     tunnel syndrome also. 3. Paroxysmal atrial fibrillation:  Again, check with Neurosurgery.     Heart rate is controlled at present.  He will need to resume this,     however, soon. 4. Diabetes type 2:  We will check CBGs a.c. and at bedtime.  We will     follow for fluctuations with diet and activity levels.  Lantus     insulin is on board with sliding-scale insulin also.  We will     titrate to need and effects. 5. End-stage renal disease:   Continue hemodialysis on Tuesday,     Thursday, and Saturday, per [employer]. 6. Anemia of chronic disease/acute blood loss:  Continue Aranesp and     routine labs will be followed at hemodialysis.  No overt signs of     blood loss on exam.     Ranelle Oyster, M.D.     ZTS/MEDQ  D:  02/20/2011  T:  02/21/2011  Job:  [id number]  cc:   Danae Orleans. Venetia Maxon, M.D.  Electronically Signed by Faith Rogue M.D. on 03/21/2011 10:32:21 AM

## 2011-03-21 NOTE — Discharge Summary (Signed)
[name], [name] NO.:  [id number]  MEDICAL RECORD NO.:  [id number]  LOCATION:  4011                         FACILITY:  [facility]  PHYSICIAN:  Ranelle Oyster, M.D.DATE OF BIRTH:  04/04/1941  DATE OF ADMISSION:  02/20/2011 DATE OF DISCHARGE:  03/14/2011                              DISCHARGE SUMMARY   DISCHARGE DIAGNOSES: 1. Cervical myelopathy with quadriparesis and C3-C6 cervical     decompression. 2. Diabetes mellitus type 2. 3. End-stage renal disease. 4. Bradycardia, asymptomatic. 5. Atrial fibrillation. 6. Anemia due to iron deficiency and due to chronic disease.  HISTORY OF PRESENT ILLNESS:  [name] is a 70 year old male with history of diabetes mellitus, end-stage renal disease, PAF admitted on February 09, 2011 with progressive weakness and numbness, bilateral upper and lower extremities and falls for the past few months.  MRI of cervical and lumbar spine showed advanced cervical spondylosis with cord deformity and abnormal signal C3-C4, C4-C5, moderate disk bulge at L3-L4 and L4 and L5 with question mass, right renal pole.  The patient was evaluated by Dr. Venetia Maxon for myelopathy and underwent C3-C6 posterior cervical decompression on February 15, 2011.  JP drain discontinued on February 20, 2011.  Cardiology was consulted for preop clearance.  A 2-D echo done showed EF of 55% to 60% with moderate AS.  Carotid Dopplers done showed no ICA stenosis.  MRI of abdomen done revealed complex lesion right kidney, chronic pancreatitis with hemosiderosis.  The patient with nocturnal bradycardia and no need for permanent pacemaker by Dr. Ladona Ridgel. Postop therapies initiated and currently, the patient is able to use of bilateral upper extremity to sit, supported at edge of bed.  He is min to guard assist for sitting at edge of bed for 35 minutes.  Able to self feed.  The patient was evaluated by rehab and we felt that he would benefit from a CIR program.  PAST MEDICAL  HISTORY: 1. Significant for PAF. 2. DM type 2. 3. 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 4. Gout. 5. Seizure disorder. 6. Bilateral lower extremity fasciotomies. 7. Hypertension. 8. Renal cancer, treated with gamma knife. 9. Anemia of chronic disease secondary to hypoparathyroidism. 10.End-stage renal disease.  ALLERGIES:  No known drug allergies.  REVIEW OF SYMPTOMS:  Positive for bowel incontinence, decreased sensation in bilateral hands and his feet.  FAMILY HISTORY:  Positive for diabetes and end-stage renal disease.  SOCIAL HISTORY:  The patient is a retired Runner, broadcasting/film/video, had friend staying with him in an apartment, no steps to entry.  Does not use any tobacco. Has not used any alcohol or marijuana since 1997.  Friends help out as needed.  PHYSICAL EXAM:  GENERAL:  The patient is well-nourished, well-developed male with cervical collar in place.  No acute distress. HEENT:  Pupils equal, round, and reactive to light.  Oral mucosa is moist with borderline dentition.  He has some thrush on tongue. NECK:  With posterior cervical incision that is clean and intact with staples in place.  Prior drain site with some mild serous drainage on dressing.  No JVD or lymphadenopathy. LUNGS:  Clear to  auscultation bilaterally without wheezes, rales or rhonchi. HEART:  Irregularly irregular rate with positive murmur. ABDOMEN:  Soft, nontender with positive bowel sounds. EXTREMITIES:  No evidence of clubbing, cyanosis or edema. NEUROLOGIC:  Cranial nerves II-XII grossly intact.  Reflexes diminished throughout.  Sensation diminished in arm and legs, more extended in hands and feet on both sides, some side of hand is more involved than his upper extremity.  He definitely has no sensory loss below ankle on either legs.  Strength is 3+-4-/5.  Shoulders, biceps, triceps, wrist and hand intrinsics may be closer to 3/5.  Shoulder a bit weaker on right  side, but biceps and triceps were stronger on right side.  Lower extremity strength is trace to 1/5 bilateral hips and knees, absent ankle dorsiflexion, plantar flexion on left, one-to-one plus on right side.  Judgment orientation, memory, mood all appropriate.  HOSPITAL COURSE:  [name] was admitted to rehab on February 20, 2011 for inpatient therapies to consist of PT, OT at least 3 hours, 5 days a week.  Past admission physiatrist rehab, RN and therapy team have worked together to provide customized collaborative interdisciplinary care.  Rehab RN has worked with the patient on wound care monitoring as well as the setting of bowel program.  He has had no issues with pain management.  A hemodialysis has been ongoing on Tuesday, Thursday, Saturday per [facility] input.  Blood pressures have been checked on b.i.d. basis and these have been reasonable ranging from 120 to 150 systolics, 50s to 70s diastolic.  The patient has had some issues with continued bradycardia, especially during hemodialysis.  [facility] was consulted for input.  They recommended the discontinuing the clonidine and no AV nodal blocking agents.  Also recommended considering 24 to 48-hour Holter past discharge.  Heart rate is currently ranging from 40s to 50s range and the patient is asymptomatic. Weight is at 77 kg.  The patient's neck incision has been monitored along.  This is healing well without any signs or symptoms of infection. Staples were discontinued on March 06, 2011 without difficulty.  The patient was noted to have stage II on his sacrum which is being treated by foam dressing changes.  Routine labs have been done during hemodialysis.  Most recent CBC reveals hemoglobin 9.1, hematocrit 27.7, white count 4.4, platelets 230.  Most recent lytes from March 09, 2011 reveals sodium 131, potassium 4.5, chloride 92, CO2 29, BUN 61, creatinine 7.58, glucose 87, calcium 9.6, phosphorus 4.1.  The  patient has had some issues with constipation requiring disimpaction.  He was started on MiraLax at bedtime with Dulcolax suppository q.a.m. for his bowel program.  Note that the patient does need to get up to a bedside commode to help with evacuation of bowels.  The patient was noted to have one positive stool guaiac.  Recommend continuing monitoring stools with workup as indicated if H and H on downward trend.  The patient was noted to have UTI at admission and he was treated with Cipro for this. Coumadin is ongoing for his AFib.  PT/INR from March 13, 2011 is therapeutic with PT at 26.9, INR 2.44 and the patient continues on 10 mg Monday to Friday and 7.5 on Saturday and Sunday.  During the patient's stay in rehab, weekly team conferences were held to monitor the patient's progress, set goals as well as discuss barriers to discharge.  At admission, the patient was noted to have significant weakness in all four extremities with impaired sensation,  impaired postural control, decreased activity tolerance and decreased balance. He was also noted to have mild bilateral ankle contractures with severely impacted his functional mobility.  He required +2 assist for transfers, min assist for propelling his wheelchair for short distances, and supervision for static sitting balance.  Currently, the patient has progressed to being at supervision level for transfers and bed mobility, supervision level for sitting balance.  He can ambulate up to 30 feet with Carley Hammed walker with +2 person assist for safety.  Further follow up physical therapy to continue past discharge.  Occupational therapy has been working with the patient on self-care tasks.  They have worked with the patient to improve functional use of bilateral upper extremity as well as activity tolerance and education regarding pressure relief measures.  The patient has made steady progress during the stay.  He is min assist for bathing supine  with head of bed elevated and while seated at the edge of bed.  His supervision for upper body dressing, total assist for lower body dressing, min assist for squat pivot toilet transfers.  He has a built-up utensils for self-feeding and brushing teeth at sink level and is independent for these tasks.  Further follow up, skilled duty to continue past discharge.  Currently, the patient and family have elected on further follow up therapies at [facility] and bed is available for March 14, 2011.  DISCHARGE MEDICATION: 1. Nephro-Vite 1 p.o. per day. 2. Allopurinol 100 mg p.o. at bedtime. 3. Keppra 500 mg p.o. b.i.d. 4. Sensipar 30 mg p.o. per day. 5. Flomax 0.4 mg at bedtime. 6. Aranesp 100 mcg IV on Saturday with hemodialysis. 7. Beneprotein supplements 1 scoop p.o. t.i.d. 8. Coumadin 10 mg Monday to Friday and 7.5 mg on Saturday and Sunday. 9. Tylenol 650 mg p.o. q.i.d. p.r.n. pain. 10.Renvela 2400 mg p.o. t.i.d. with meals. 11.Lyrica 75 mg p.o. at bedtime. 12.Lexapro 10 mg p.o. at bedtime. 13.FiberCon 1 tablet p.o. b.i.d. 14.MiraLax 17 g p.o. at bedtime. 15.Dulcolax suppository 1 per rectum q.a.m. 16.__________ 125 mcg IV Tuesday, Thursday, Saturday with     hemodialysis. 17.Vistaril 25 mg p.o. three times p.r.n. itching. 18.Calcium carbonate 500 mg p.o. q.6 hours p.r.n. indigestion. 19.Nitroglycerin 0.4 mg sublingual p.r.n. chest pain, may repeat q. 5     minutes x3 doses. 20.Phenergan 12.5 mg p.o. q.6 hours p.r.n. nausea.  DIET:  An 80-2-2 with 1200 mL fluid restriction with carb-modified restrictions additionally.  Activity level is 24-hour supervision and assistance.  SPECIAL INSTRUCTIONS:  Note, the patient was taken off Lantus insulin due to hypoglycemia.  Currently blood sugars are ranging from 75 to 140s range.  Recommend checking blood sugars at least on a.c. and at bedtime basis with sliding scale insulin to be used per protocol.  Routine monitoring of INR to keep  INR goal between 2 to 3.  Routine pressure relief measures.  Change foam dressing to sacrum q. 3 to 5 days. Monitor of heart rate on b.i.d. basis.  Hemodialysis on Tuesday, Thursday, and Saturday.  FOLLOWUP:  The patient to follow up with Dr. Riley Kill in the next 4 weeks. Follow up with Cardiology for recheck in 2 weeks.  Follow up with [facility] for protocol.     Delle Reining, P.A.   ______________________________ Ranelle Oyster, M.D.    PL/MEDQ  D:  03/13/2011  T:  03/13/2011  Job:  [id number]  cc:   [facility] Jesse Sans. Daleen Squibb, MD, [facility] Danae Orleans. Venetia Maxon, M.D.  Electronically Signed by  PAMELA LOVE P.A. on 03/15/2011 02:17:49 PM Electronically Signed by Faith Rogue M.D. on 03/21/2011 10:32:24 AM

## 2011-03-25 NOTE — Discharge Summary (Signed)
[name], [name] NO.:  [id number]  MEDICAL RECORD NO.:  [id number]  LOCATION:  3114                         FACILITY:  [facility]  PHYSICIAN:  Jonny Ruiz, MD    DATE OF BIRTH:  Aug 12, 1940  DATE OF ADMISSION:  02/09/2011 DATE OF DISCHARGE:  02/20/2011                              DISCHARGE SUMMARY   DIAGNOSES: 1. Spinal stenosis with myelopathy due to compression, C5-C6 status     post laminectomy on February 15, 2011. 2. End-stage renal disease on hemodialysis Tuesday, Thursday, and     Saturday. 3. Anemia of chronic disease secondary to end-stage renal disease. 4. Chronic atrial fibrillation. 5. Aortic stenosis, moderate. 6. Secondary hyperparathyroidism. 7. Diabetes mellitus type 2. 8. Seizures. 9. Diabetic neuropathy. 10.Hypertension. 11.History of renal cancer. 12.Chronic constipation. 13.Benign prostatic hypertrophy. 14.Gout.  DISCHARGE MEDICATIONS:  Discharge medications at the time of transfer to rehab on February 20, 2011: 1. Allopurinol 100 mg p.o. at bedtime. 2. Sensipar 30 mg p.o. 3 times a day with meal. 3. Aranesp 100 mcg IV during dialysis on Saturday. 4. Neurontin 100 mg 3 times a day. 5. Insulin sliding scale. 6. Lantus 10 units subcu at bedtime. 7. Labetalol 100 mg b.i.d. 8. Keppra 500 mg p.o. b.i.d. 9. Zemplar 5 mcg IV with hemodialysis. 10.Nephro-Vite 1 tablet p.o. at bedtime. 11.Renagel 2400 mg p.o. t.i.d. with meal. 12.Flomax 0.4 mg p.o. at bedtime. 13.Calcium carbonate 500 mg p.o. q.6 p.r.n. 14.Tylenol 650 p.o. q.6 p.r.n. 15.Clonidine 0.1 mg b.i.d. p.r.n. for hypertension. 16.Zolpidem 5 mg p.o. at bedtime p.r.n. insomnia.  CONSULTANTS: 1. Thomas C. Daleen Squibb, MD, [facility], Cardiology. 2. Danae Orleans. Venetia Maxon, MD, Neurosurgery.  PROCEDURES: 1. C3 through C6 posterior cervical laminectomy for spinal stenosis by     Dr. Maeola Harman and Dr. Hilda Lias. 2. A 2-D echocardiogram; left ventricle mildly dilated, wall thickness  increased with severe LVH, systolic function normal, EF 55%-60%.     Aortic valve moderate stenosis with trivial regurgitation.  Mitral     valve, calcified annulus.  Left atrium moderately dilated.     Pulmonary arteries PA peak pressure 31 mmHg.  EKG; atrial     fibrillation at 58 beats per minute with left ventricular     hypertrophy. 3. Carotid Dopplers, bilateral minimal plaque.  No ICA stenosis.     Vertebral artery flow antegrade. 4. Hemodialysis. 5. MRI of abdomen without contrast. Impression: 1. Degraded exam secondary to mild motion artifact and lack of IV     contrast. 2. Complex upper pole right renal mass.  Complexities at least     partially secondary to underlying hemorrhage.  This might simply     represent a cyst with complicated by repeat hemorrhage.  However, a     hemorrhagic neoplasm cannot be excluded without IV contrast. 3. 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 4. Hemosiderosis. 5. Renal disease on dialysis.     a.     MRI cervical and spine without contrast, cervical spine      first; advanced cervical spondylosis.  Spinal stenosis at the C3-      C4 and C4-C5 levels with cord deformity and abnormal cord signal.      Bilateral  neural foraminal stenosis at those levels.  Lesser      spondylosis at C5-C6 with effacement surrounding arachnoid space      and only mild cord deformity.  No abnormal cord edema at this      level.  Neural foraminal narrowing bilaterally at C5-C6.      Bilateral neural foraminal stenosis at C7-T1 that could affect      either C8 roots.  MRI of lumbar spine L2-L3 known compressive disk      bulge, L3-L4 disk pulse and facet hypertrophy.  Mild stenosis of      the lateral recesses and foramina, not definitely compressive.  L4-      L5 disk bulge and facet hypertrophy.  Mild narrowing of the      lateral recesses and neural foramina, not definitely compressive.      Multiple renal cysts.     b.     Chest x-ray on February 20, 2011, no  cardiac or pulmonary      process.     c.     CT scan of the head without contrast, no intracranial      hemorrhage or CT evidence of large acute infarct.  Mild small      vessel disease-type changes.  [name] was admitted to our hospital on February 09, 2011, for progressive muscular weakness and frequent falling and was found with severe spinal cord compression with decreased cord signal on MRI.  He was admitted to the hospital and underwent a posterior cervical laminectomy from C3 through C5 levels.  The patient was on Coumadin for his chronic atrial fibrillation and once his INR was corrected, he was cleared from Cardiology to undergo surgery.  After his surgical procedure, he was observed in the ICU unit and he had no complications.  He underwent hemodialysis per his regular schedule without complications.  He did very well and with the help of Physical Therapy, gradually regained his strength.  Telemetry showed atrial fibrillation with good rate control. His diabetes remained also well controlled with blood sugar readings ranging between 88-189.  He received coverage with insulin sliding scale as needed.  He did not have any seizures during his hospitalization and his anemia remained stable as well.  On February 20, 2011, he was stable and was transferred to rehab for further management.  CONDITION UPON DISCHARGE:  Stable.          ______________________________ Jonny Ruiz, MD     GL/MEDQ  D:  03/24/2011  T:  03/25/2011  Job:  [id number]  Electronically Signed by Jonny Ruiz MD on 03/25/2011 10:35:55 PM

## 2011-03-30 NOTE — Consult Note (Signed)
NAMEDEJAUN, [name] NO.:  [id number]  MEDICAL RECORD NO.:  [id number]  LOCATION:  4011                         FACILITY:  [facility]  PHYSICIAN:  Hillis Range, MD       DATE OF BIRTH:  [birth year]  DATE OF CONSULTATION: DATE OF DISCHARGE:                                CONSULTATION   REQUESTING PHYSICIAN:  Ranelle Oyster, MD  REASON FOR CONSULTATION:  Bradycardia.  HISTORY OF PRESENT ILLNESS:  [name] is a pleasant 70 year old gentleman with a history of permanent atrial fibrillation who is now recovering status post C3 through C6 laminectomy for spinal stenosis. He is presently in the [facility] and continues to make slow recovery.  He has been observed by the rehab staff to have bradycardia.  Cardiology is therefore consulted.  Presently, the patient is resting comfortably.  He denies chest pain, shortness of breath, dizziness, palpitations, presyncope, or syncope.  He reports fatigue, which has been longstanding.  He has multiple comorbidities including end-stage renal disease, anemia, myelopathy with significant debility, and moderate aortic stenosis.  He continues to participate in rehab without difficulty and is otherwise without complaint at this time.  PAST MEDICAL HISTORY: 1. End-stage renal disease, on hemodialysis. 2. Chronic anemia. 3. Myelopathy with decreased mobility. 4. Permanent atrial fibrillation. 5. Moderate aortic stenosis. 6. Diabetes. 7. Hypertension. 8. Gout. 9. Secondary hyperparathyroidism. 10.Seizure disorder. 11.Diabetic neuropathy. 12.History of renal cancer, status post radiation. 13.Benign prostatic hypertrophy. 14.History of stroke with residual left leg weakness since 1999. 15.Osteoarthritis.  MEDICATIONS:  Reviewed in the [facility].  ALLERGIES:  No known drug allergies.  SOCIAL HISTORY:  The patient denies tobacco, alcohol, or drug use.  FAMILY HISTORY:  Notable for diabetes.  REVIEW OF  SYSTEMS:  All systems are reviewed and negative except as outlined in the HPI above.  PHYSICAL EXAMINATION:  VITAL SIGNS:  Blood pressure 135/73, heart rate 61.  Presently, he has had heart rates recorded predominantly in the 50s ranging from 47-65.  Blood pressure 135/73, sats 100% on room air, temperature 99.3. GENERAL:  The patient is a chronically ill, elderly male in no acute distress.  He is alert and oriented x3, lying supine in bed with a cervical collar in place. HEENT:  Normocephalic, atraumatic.  Sclerae clear.  Conjunctivae pink. Oropharynx clear. NECK:  Supple.  He does have a cervical collar in place and is status post recent surgery. LUNGS:  Clear to auscultation bilaterally. HEART:  Irregular rate and rhythm, 2/6 systolic ejection murmur along the left upper sternal border, which is mid-peaking and S2 is very audible. GI:  Soft, nontender, and nondistended.  Positive bowel sounds. EXTREMITIES:  He does have trace dependent edema. SKIN:  No ecchymoses or lacerations. MUSCULOSKELETAL:  Diffuse muscle atrophy. PSYCHIATRY:  Euthymic mood.  Full affect.  EKG from today reveals atrial fibrillation with a ventricular rate of 55 beats per minute.  The QRS duration is 94 milliseconds and a QT interval is 440 milliseconds.  Echocardiogram from February 12, 2011 is reviewed and reveals an ejection fraction of 55-60% with moderate aortic stenosis, mild aortic regurgitation and moderate-to-severe left atrial enlargement with a left atrial size  of 51 mm.  LABORATORY DATA:  TSH from February 09, 2011, reveals TSH of 2.964, INR is 1.37.  Hematocrit is 25, white blood cell count 5.1, platelets 229. Potassium 4.5, BUN 66.  Fecal occult blood test positive.  IMPRESSION:  [name] is a pleasant 70 year old gentleman with multiple comorbidities as outlined above.  He is observed to have permanent atrial fibrillation, for which, he is chronically anticoagulated with Coumadin.  He appears to  have fecal occult positive blood testing; however, his hematocrit appears to be stable, but low at 25.  He does not appear to have evidence of an acute gastrointestinal bleed, but may have chronic gastrointestinal bleeding issues.  I think that at this time, it would be reasonable to continue Coumadin and consider Gastroenterology workup as an outpatient once his other issues are stable.  If he has a precipitous decline in his hematocrit, then he may require more urgent Gastroenterology consultation.  He appears to be relatively asymptomatic with his bradycardia.  He does report fatigue; however, I think this is multifactorial and likely due to his sedentary lifestyle, presently recent surgery, pain medications, and also anemia. He does not have any indications for pacemaker implantation at this time.  No further cardiac workup is required at this time.  I would therefore recommend that he continue his current regimen.  If he develops symptoms of bradycardia in the future, I would be happy to further evaluate.  Otherwise, we will see the patient on an as-needed basis.     Hillis Range, MD    JA/MEDQ  D:  03/03/2011  T:  03/04/2011  Job:  [id number]  cc:   Ranelle Oyster, M.D.  Electronically Signed by Hillis Range MD on 03/30/2011 09:44:07 AM

## 2011-04-06 ENCOUNTER — Encounter: Payer: Self-pay | Admitting: Physician Assistant

## 2011-04-07 ENCOUNTER — Ambulatory Visit (INDEPENDENT_AMBULATORY_CARE_PROVIDER_SITE_OTHER): Payer: Medicare Other | Admitting: Physician Assistant

## 2011-04-07 ENCOUNTER — Encounter: Payer: Self-pay | Admitting: Physician Assistant

## 2011-04-07 ENCOUNTER — Encounter (INDEPENDENT_AMBULATORY_CARE_PROVIDER_SITE_OTHER): Payer: Medicare Other

## 2011-04-07 VITALS — BP 120/76 | HR 64 | Ht 68.5 in | Wt 161.0 lb

## 2011-04-07 DIAGNOSIS — N186 End stage renal disease: Secondary | ICD-10-CM | POA: Insufficient documentation

## 2011-04-07 DIAGNOSIS — M4802 Spinal stenosis, cervical region: Secondary | ICD-10-CM | POA: Insufficient documentation

## 2011-04-07 DIAGNOSIS — I498 Other specified cardiac arrhythmias: Secondary | ICD-10-CM

## 2011-04-07 DIAGNOSIS — I350 Nonrheumatic aortic (valve) stenosis: Secondary | ICD-10-CM | POA: Insufficient documentation

## 2011-04-07 DIAGNOSIS — E119 Type 2 diabetes mellitus without complications: Secondary | ICD-10-CM | POA: Insufficient documentation

## 2011-04-07 DIAGNOSIS — R0989 Other specified symptoms and signs involving the circulatory and respiratory systems: Secondary | ICD-10-CM | POA: Insufficient documentation

## 2011-04-07 DIAGNOSIS — C649 Malignant neoplasm of unspecified kidney, except renal pelvis: Secondary | ICD-10-CM | POA: Insufficient documentation

## 2011-04-07 DIAGNOSIS — I10 Essential (primary) hypertension: Secondary | ICD-10-CM | POA: Insufficient documentation

## 2011-04-07 DIAGNOSIS — R001 Bradycardia, unspecified: Secondary | ICD-10-CM | POA: Insufficient documentation

## 2011-04-07 DIAGNOSIS — I359 Nonrheumatic aortic valve disorder, unspecified: Secondary | ICD-10-CM

## 2011-04-07 DIAGNOSIS — I4891 Unspecified atrial fibrillation: Secondary | ICD-10-CM | POA: Insufficient documentation

## 2011-04-07 NOTE — Assessment & Plan Note (Addendum)
HR ok on ECG today.  No symptoms of bradycardia.  Will go ahead and get a 48 hr Holter.  Follow up with Dr. Johney Frame in 2-3 months.  If he has significant bradycardia, will need to d/c Labetalol and consider stopping clonidine.  The notes from the hospital indicated that he was not d/c on Labetalol or Clonidine.  However, this has somehow been restarted.  He is stable at this time without evidence of bradycardia.  Will not make any changes unless objective evidence of significant bradycardia or symptoms.

## 2011-04-07 NOTE — Assessment & Plan Note (Signed)
Moderate by echo 7/12.  Plan on repeat echo in next 6 months.  Will have him follow up with Dr. Johney Frame in 2-3 months.

## 2011-04-07 NOTE — Assessment & Plan Note (Signed)
Follow up with neurosurgery and PT.

## 2011-04-07 NOTE — Assessment & Plan Note (Signed)
Rate controlled.  Coumadin managed by dialysis.

## 2011-04-07 NOTE — Assessment & Plan Note (Signed)
With Diabetes and ESRD, likely has CAD.  LDL 38 in the hospital.  Do not think a statin is indicated at this time.  He is on coumadin for Afib and should be ok without ASA.  No symptoms of angina.

## 2011-04-07 NOTE — Progress Notes (Addendum)
History of Present Illness: [name] is a 70 y.o. male who presents for post hospital follow up.    He has a history of end-stage renal disease, atrial fibrillation, diabetes, history of stroke, seizure disorder, hypertension and renal carcinoma.  He was followed at [facility] years ago for his AFib.  He was admitted to the hospital in July with progressive upper and lower extremity weakness.  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  He was seen by Dr. Daleen Squibb for cardiac clearance.  He was noted to have aortic stenosis and an echocardiogram confirmed moderate aortic stenosis with a mean gradient of 21, severe LVH and normal LV function.  He was cleared for surgery and underwent cervical laminectomy.  He was eventually discharged to rehabilitation.  He was noted to have episodes of bradycardia and was seen again by Dr. Johney Frame.  It was recommended AV nodal blocking agents should be avoided.  It was felt that he was asymptomatic from his bradycardia.  It was recommended that consideration should be given towards obtaining a 24-48 hour Holter after discharge.  He is recovering well from his neck surgery.  Feels stronger in arms and legs.  The patient denies chest pain, shortness of breath, syncope, orthopnea, PND or significant pedal edema.  Denies palpitations.  Coumadin is checked at dialysis.    Past Medical History  Diagnosis Date  . End stage renal disease     on hemodialysis  . Cervical stenosis of spinal canal     s/p C3-C6 laminectomy with Dr. Venetia Maxon and Dr. Jeral Fruit  . Anemia     chronic disease  . Atrial fibrillation     coumadin followed by SNF  . Aortic stenosis     Echo 02/12/11: Severe LVH, EF 55-60%, moderate aortic stenosis, mean gradient 21, trivial AI, mild MR, moderate LAE, PAS P. 31  . Hyperparathyroidism, secondary   . Diabetes mellitus   . Seizure disorder   . Neuropathy     diabetec  . Hypertension   . Hyperkalemia   . BPH (benign  prostatic hypertrophy)   . Gout   . History of stroke   . Renal carcinoma     followed at [facility]; s/p gamma knife surgery  . Carotid bruit     Doppler 7/12: Plaque without significant ICA stenosis    Current Outpatient Prescriptions  Medication Sig Dispense Refill  . acetaminophen (TYLENOL) 325 MG tablet Take 650 mg by mouth as needed.        Marland Kitchen allopurinol (ZYLOPRIM) 100 MG tablet Take 100 mg by mouth daily.        Marland Kitchen b complex-vitamin c-folic acid (NEPHRO-VITE) 0.8 MG TABS Take 0.8 mg by mouth at bedtime.        . bisacodyl (BISCOLAX) 10 MG suppository Place 10 mg rectally as needed.        . calcium carbonate (TUMS - DOSED IN MG ELEMENTAL CALCIUM) 500 MG chewable tablet Chew 1 tablet by mouth as needed.        . cinacalcet (SENSIPAR) 30 MG tablet Take 30 mg by mouth daily.        . collagenase (SANTYL) ointment Apply topically as directed.        . escitalopram (LEXAPRO) 10 MG tablet Take 10 mg by mouth daily.        . feeding supplement (PRO-STAT SUGAR FREE 64) LIQD Take 30 mLs by mouth as directed.        . hydrOXYzine (ATARAX)  25 MG tablet Take 25 mg by mouth 3 (three) times daily as needed.        . levETIRAcetam (KEPPRA) 500 MG tablet Take 500 mg by mouth every 12 (twelve) hours.        . nitroGLYCERIN (NITROSTAT) 0.4 MG SL tablet Place 0.4 mg under the tongue every 5 (five) minutes as needed.        . NON FORMULARY Peg 3350 pow as needed        . polycarbophil (FIBERCON) 625 MG tablet Take 625 mg by mouth 2 (two) times daily.        . pregabalin (LYRICA) 75 MG capsule Take 75 mg by mouth daily.        . promethazine (PHENERGAN) 12.5 MG tablet Take 12.5 mg by mouth every 6 (six) hours as needed.        . sevelamer (RENVELA) 800 MG tablet Take 800 mg by mouth 3 (three) times daily with meals.        . Tamsulosin HCl (FLOMAX) 0.4 MG CAPS Take 0.4 mg by mouth.        . vitamin C (ASCORBIC ACID) 500 MG tablet Take 500 mg by mouth daily.        Marland Kitchen warfarin (COUMADIN) 10 MG tablet Take 10  mg by mouth daily.        Marland Kitchen zinc gluconate 50 MG tablet Take 50 mg by mouth daily.        . cloNIDine (CATAPRES) 0.1 MG tablet Take 0.1 mg by mouth 2 (two) times daily.        . darbepoetin (ARANESP) 100 MCG/0.5ML SOLN Inject 100 mcg into the skin.        Marland Kitchen gabapentin (NEURONTIN) 100 MG capsule Take 100 mg by mouth 3 (three) times daily.        . insulin glargine (LANTUS) 100 UNIT/ML injection Inject 10 Units into the skin at bedtime.        Marland Kitchen labetalol (NORMODYNE) 100 MG tablet Take 100 mg by mouth 2 (two) times daily.        . paricalcitol (ZEMPLAR) 5 MCG/ML injection Inject into the vein one time in dialysis.        Marland Kitchen sevelamer (RENAGEL) 800 MG tablet Take 2,400 mg by mouth 3 (three) times daily with meals.        Marland Kitchen zolpidem (AMBIEN) 5 MG tablet Take 5 mg by mouth at bedtime as needed.          Allergies: No Known Allergies  Vital Signs: BP 120/76  Pulse 64  Ht 5' 8.5" (1.74 m)  Wt 161 lb (73.029 kg)  BMI 24.12 kg/m2  PHYSICAL EXAM: Well nourished, well developed, in no acute distress HEENT: normal Neck: no JVD at 90 degrees Cardiac:  normal S1, S2; RRR; 3/6 harsh systolic murmur loudest along LLSB Lungs:  clear to auscultation bilaterally, no wheezing, rhonchi or rales Abd: soft, nontender, no hepatomegaly Ext: no edema Skin: warm and dry Neuro:  CNs 2-12 intact, bilateral upper and lower extremity weakness noted  EKG:   Atrial fibrillation, HR 64, rightward axis, PRWP, NSSTTW changes  ASSESSMENT AND PLAN:

## 2011-04-07 NOTE — Patient Instructions (Signed)
Your physician has recommended that you wear a 48 HOUR holter monitor. Holter monitors are medical devices that record the heart's electrical activity. Doctors most often use these monitors to diagnose arrhythmias. Arrhythmias are problems with the speed or rhythm of the heartbeat. The monitor is a small, portable device. You can wear one while you do your normal daily activities. This is usually used to diagnose what is causing palpitations/syncope (passing out).  Your physician recommends that you schedule a follow-up appointment in: 2-3 MONTHS WITH DR. ALLRED PER SCOTT WEAVER, PA-C

## 2011-04-19 ENCOUNTER — Encounter: Payer: Medicare Other | Attending: Physical Medicine & Rehabilitation | Admitting: Physical Medicine & Rehabilitation

## 2011-04-19 DIAGNOSIS — M4712 Other spondylosis with myelopathy, cervical region: Secondary | ICD-10-CM | POA: Insufficient documentation

## 2011-04-19 DIAGNOSIS — G8110 Spastic hemiplegia affecting unspecified side: Secondary | ICD-10-CM

## 2011-04-19 DIAGNOSIS — G8250 Quadriplegia, unspecified: Secondary | ICD-10-CM | POA: Insufficient documentation

## 2011-04-19 DIAGNOSIS — N186 End stage renal disease: Secondary | ICD-10-CM | POA: Insufficient documentation

## 2011-04-19 DIAGNOSIS — M7530 Calcific tendinitis of unspecified shoulder: Secondary | ICD-10-CM

## 2011-04-19 DIAGNOSIS — M719 Bursopathy, unspecified: Secondary | ICD-10-CM | POA: Insufficient documentation

## 2011-04-19 DIAGNOSIS — Z8673 Personal history of transient ischemic attack (TIA), and cerebral infarction without residual deficits: Secondary | ICD-10-CM | POA: Insufficient documentation

## 2011-04-19 DIAGNOSIS — M67919 Unspecified disorder of synovium and tendon, unspecified shoulder: Secondary | ICD-10-CM | POA: Insufficient documentation

## 2011-04-19 DIAGNOSIS — E1142 Type 2 diabetes mellitus with diabetic polyneuropathy: Secondary | ICD-10-CM

## 2011-04-19 NOTE — Assessment & Plan Note (Signed)
Mr. Brysun is back regarding cervical myelopathy with quadriparesis and history of prior right CVA.  He is at skilled nursing unit currently and making progress.  PT is working with him and improving his gait and strength.  He reports improving use of the upper extremities particularly.  He has had decreased numbness in both his hand.  He complains of some mild left shoulder pain in the morning when he awakens, but usually this goes away after a few minutes.  This tends to be worse when he sleeps towards his left side.  He continues with hemodialysis without issue.  He does report constipation, usually requiring suppository for some resolution.  REVIEW OF SYSTEMS:  Notable for the above.  He reports occasional dizziness.  Sleep apnea.  Full 12-point review is in the written health and history section chart.  SOCIAL HISTORY:  Unchanged.  Eventually, moved into his apartment once his level where he can be independent.  PHYSICAL EXAMINATION:  Blood pressure 116/65, pulse is 52, respiratory rate is 18.  He is satting 100% on room air.  The patient is pleasant and alert, sitting in his wheelchair today.  Strength in the upper extremities is near 4/5.  Does lack still some fine motor coordination of the left greater than right hand intrinsics.  Sensation is diminished in the hands and fingers, but improves as he work approximately. Sensation is near normal above the elbows.  He has diminished sensory function in both legs distally also.  Left shoulder is nontender with rotator cuff impingement maneuvers.  He is able to reach behind his head without significant effort.  Right lower extremity strength is grossly 2/5 in the hip and knee.  He is 4/5 right ankle.  Left lower extremity in a double upright AFO.  He has trace movement of left ankle.  He has 1- 2 strength at the knee, 2/5 at the left hip.  Left shoe is obviously worn from ticking the toe into the ground.  Reflexes are bit  hyperactive in both lower extremities at 2+ to 3+.  ASSESSMENT: 1. Cervical myelopathy with quadriparesis and status post C3-C6     cervical decompression. 2. History of right cerebrovascular accident. 3. 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 4. End-stage renal disease. 5. Mild rotator cuff syndrome.  PLAN: 1. The patient is making great progress.  I would continue on gait     training and strengthening with therapy as he has been doing.  I     have nothing really new to recommend.  I did think that he would     benefit from an evaluation of his AFO by [employer] to adjust the     hinged function as well as to put a kick plate on the shoe. 2. Increased MiraLax twice a day for constipation. 3. I will see the patient back in about 2 months.  Although, he is     making progress, he is probably quite a bit a ways from being able     to function on his own in an apartment setting.     Ranelle Oyster, M.D. Electronically Signed    ZTS/MedQ D:  04/19/2011 11:17:36  T:  04/19/2011 12:32:54  Job #:  [id number]

## 2011-05-12 ENCOUNTER — Other Ambulatory Visit (HOSPITAL_COMMUNITY): Payer: Self-pay | Admitting: Nephrology

## 2011-05-12 DIAGNOSIS — N186 End stage renal disease: Secondary | ICD-10-CM

## 2011-05-17 ENCOUNTER — Ambulatory Visit (HOSPITAL_COMMUNITY)
Admission: RE | Admit: 2011-05-17 | Discharge: 2011-05-17 | Disposition: A | Discharge status: Home / Self Care | Payer: Medicare Other | Source: Ambulatory Visit | Attending: Nephrology | Admitting: Nephrology

## 2011-05-17 ENCOUNTER — Other Ambulatory Visit (HOSPITAL_COMMUNITY): Payer: Self-pay | Admitting: Nephrology

## 2011-05-17 DIAGNOSIS — N400 Enlarged prostate without lower urinary tract symptoms: Secondary | ICD-10-CM | POA: Insufficient documentation

## 2011-05-17 DIAGNOSIS — T82898A Other specified complication of vascular prosthetic devices, implants and grafts, initial encounter: Secondary | ICD-10-CM | POA: Insufficient documentation

## 2011-05-17 DIAGNOSIS — Z8673 Personal history of transient ischemic attack (TIA), and cerebral infarction without residual deficits: Secondary | ICD-10-CM | POA: Insufficient documentation

## 2011-05-17 DIAGNOSIS — I871 Compression of vein: Secondary | ICD-10-CM | POA: Insufficient documentation

## 2011-05-17 DIAGNOSIS — G4730 Sleep apnea, unspecified: Secondary | ICD-10-CM | POA: Insufficient documentation

## 2011-05-17 DIAGNOSIS — E1142 Type 2 diabetes mellitus with diabetic polyneuropathy: Secondary | ICD-10-CM | POA: Insufficient documentation

## 2011-05-17 DIAGNOSIS — E1149 Type 2 diabetes mellitus with other diabetic neurological complication: Secondary | ICD-10-CM | POA: Insufficient documentation

## 2011-05-17 DIAGNOSIS — E785 Hyperlipidemia, unspecified: Secondary | ICD-10-CM | POA: Insufficient documentation

## 2011-05-17 DIAGNOSIS — N186 End stage renal disease: Secondary | ICD-10-CM

## 2011-05-17 DIAGNOSIS — M1990 Unspecified osteoarthritis, unspecified site: Secondary | ICD-10-CM | POA: Insufficient documentation

## 2011-05-17 DIAGNOSIS — M109 Gout, unspecified: Secondary | ICD-10-CM | POA: Insufficient documentation

## 2011-05-17 DIAGNOSIS — D649 Anemia, unspecified: Secondary | ICD-10-CM | POA: Insufficient documentation

## 2011-05-17 DIAGNOSIS — M216X9 Other acquired deformities of unspecified foot: Secondary | ICD-10-CM | POA: Insufficient documentation

## 2011-05-17 DIAGNOSIS — Y832 Surgical operation with anastomosis, bypass or graft as the cause of abnormal reaction of the patient, or of later complication, without mention of misadventure at the time of the procedure: Secondary | ICD-10-CM | POA: Insufficient documentation

## 2011-05-17 DIAGNOSIS — G40909 Epilepsy, unspecified, not intractable, without status epilepticus: Secondary | ICD-10-CM | POA: Insufficient documentation

## 2011-05-17 DIAGNOSIS — I120 Hypertensive chronic kidney disease with stage 5 chronic kidney disease or end stage renal disease: Secondary | ICD-10-CM | POA: Insufficient documentation

## 2011-05-17 DIAGNOSIS — I4891 Unspecified atrial fibrillation: Secondary | ICD-10-CM | POA: Insufficient documentation

## 2011-05-17 MED ORDER — IOHEXOL 300 MG/ML  SOLN
100.0000 mL | Freq: Once | INTRAMUSCULAR | Status: AC | PRN
  Administered 2011-05-17: 50 mL via INTRAVENOUS

## 2011-06-12 ENCOUNTER — Encounter: Payer: Self-pay | Admitting: Internal Medicine

## 2011-06-12 ENCOUNTER — Ambulatory Visit (INDEPENDENT_AMBULATORY_CARE_PROVIDER_SITE_OTHER): Payer: Medicare Other | Admitting: Internal Medicine

## 2011-06-12 DIAGNOSIS — I4891 Unspecified atrial fibrillation: Secondary | ICD-10-CM

## 2011-06-12 DIAGNOSIS — I350 Nonrheumatic aortic (valve) stenosis: Secondary | ICD-10-CM

## 2011-06-12 DIAGNOSIS — R001 Bradycardia, unspecified: Secondary | ICD-10-CM

## 2011-06-12 DIAGNOSIS — I10 Essential (primary) hypertension: Secondary | ICD-10-CM

## 2011-06-12 DIAGNOSIS — I359 Nonrheumatic aortic valve disorder, unspecified: Secondary | ICD-10-CM

## 2011-06-12 DIAGNOSIS — I498 Other specified cardiac arrhythmias: Secondary | ICD-10-CM

## 2011-06-12 NOTE — Assessment & Plan Note (Addendum)
Asymptomatic Recent holter monitor reviewed No indication for pacemaker at this time  If further bradycardia with symptoms, would avoid clonidine and labetalol at that point.  As he is doing so well, I have made no changes today.

## 2011-06-12 NOTE — Assessment & Plan Note (Signed)
Moderate AS by echo and exam,  Asymptomatic He will return to follow-up with Dr Daleen Squibb in 6 months.   No further EP management planned at this time.

## 2011-06-12 NOTE — Progress Notes (Signed)
The patient presents today for routine electrophysiology followup.  Since his recent hospital discharge, the patient reports doing reasonably well.  His strength continues to improve with rehab.  He denies any symptoms of bradycardia.  Today, he denies symptoms of palpitations, chest pain, shortness of breath, orthopnea, PND, lower extremity edema, dizziness, presyncope, syncope, or neurologic sequela.  He feels that his energy is preserved.  The patient feels that he is tolerating medications without difficulties and is otherwise without complaint today.   Past Medical History  Diagnosis Date  . End stage renal disease     on hemodialysis  . Cervical stenosis of spinal canal     s/p C3-C6 laminectomy with Dr. Venetia Maxon and Dr. Jeral Fruit  . Anemia     chronic disease  . Atrial fibrillation     coumadin followed by SNF, longstanding persistent and probably permanent afib  . Aortic stenosis     Echo 02/12/11: moderate aortic stenosis, mean gradient 21,  . Hyperparathyroidism, secondary   . Diabetes mellitus   . Seizure disorder   . Neuropathy     diabetec  . Hypertension   . Hyperkalemia   . BPH (benign prostatic hypertrophy)   . Gout   . History of stroke   . Renal carcinoma     followed at [facility]; s/p gamma knife surgery  . Carotid bruit     Doppler 7/12: Plaque without significant ICA stenosis  . Bradycardia     asymptomatic  . Atrial enlargement, bilateral    No past surgical history on file.  Current Outpatient Prescriptions  Medication Sig Dispense Refill  . acetaminophen (TYLENOL) 325 MG tablet Take 650 mg by mouth as needed.        Marland Kitchen allopurinol (ZYLOPRIM) 100 MG tablet Take 100 mg by mouth daily.        Marland Kitchen b complex-vitamin c-folic acid (NEPHRO-VITE) 0.8 MG TABS Take 0.8 mg by mouth at bedtime.        . bisacodyl (BISCOLAX) 10 MG suppository Place 10 mg rectally as needed.        . calcium carbonate (TUMS - DOSED IN MG ELEMENTAL CALCIUM) 500 MG chewable tablet Chew 1 tablet by  mouth as needed.        . cinacalcet (SENSIPAR) 30 MG tablet Take 30 mg by mouth daily.        . cloNIDine (CATAPRES) 0.1 MG tablet Take 0.1 mg by mouth 2 (two) times daily.        . collagenase (SANTYL) ointment Apply topically as directed.        . darbepoetin (ARANESP) 100 MCG/0.5ML SOLN Inject 100 mcg into the skin.        Marland Kitchen escitalopram (LEXAPRO) 10 MG tablet Take 10 mg by mouth daily.        . feeding supplement (PRO-STAT SUGAR FREE 64) LIQD Take 30 mLs by mouth as directed.        . gabapentin (NEURONTIN) 100 MG capsule Take 100 mg by mouth 3 (three) times daily.        . hydrOXYzine (ATARAX) 25 MG tablet Take 25 mg by mouth 3 (three) times daily as needed.        . insulin glargine (LANTUS) 100 UNIT/ML injection Inject 10 Units into the skin at bedtime.        Marland Kitchen labetalol (NORMODYNE) 100 MG tablet Take 100 mg by mouth 2 (two) times daily.        Marland Kitchen levETIRAcetam (KEPPRA) 500 MG tablet Take 500  mg by mouth every 12 (twelve) hours.        . nitroGLYCERIN (NITROSTAT) 0.4 MG SL tablet Place 0.4 mg under the tongue every 5 (five) minutes as needed.        . NON FORMULARY Peg 3350 pow as needed        . paricalcitol (ZEMPLAR) 5 MCG/ML injection Inject into the vein one time in dialysis.        Marland Kitchen polycarbophil (FIBERCON) 625 MG tablet Take 625 mg by mouth 2 (two) times daily.        . pregabalin (LYRICA) 75 MG capsule Take 75 mg by mouth daily.        . promethazine (PHENERGAN) 12.5 MG tablet Take 12.5 mg by mouth every 6 (six) hours as needed.        . sevelamer (RENAGEL) 800 MG tablet Take 2,400 mg by mouth 3 (three) times daily with meals.        . sevelamer (RENVELA) 800 MG tablet Take 800 mg by mouth 3 (three) times daily with meals.        . Tamsulosin HCl (FLOMAX) 0.4 MG CAPS Take 0.4 mg by mouth.        . vitamin C (ASCORBIC ACID) 500 MG tablet Take 500 mg by mouth daily.        Marland Kitchen warfarin (COUMADIN) 10 MG tablet Take 10 mg by mouth daily.        Marland Kitchen zinc gluconate 50 MG tablet Take 50  mg by mouth daily.        Marland Kitchen zolpidem (AMBIEN) 5 MG tablet Take 5 mg by mouth at bedtime as needed.          No Known Allergies  History   Social History  . Marital Status: Divorced    Spouse Name: N/A    Number of Children: N/A  . Years of Education: N/A   Occupational History  . Not on file.   Social History Main Topics  . Smoking status: Never Smoker   . Smokeless tobacco: Not on file  . Alcohol Use: No  . Drug Use: No  . Sexually Active: Not on file   Other Topics Concern  . Not on file   Social History Narrative  . No narrative on file    Physical Exam: Filed Vitals:   06/12/11 1051  BP: 121/69  Pulse: 53  Height: 5\' 9"  (1.753 m)  Weight: 167 lb (75.751 kg)    GEN- The patient is chronically ill appearing, alert and oriented x 3 today.   Head- normocephalic, atraumatic Eyes-  Sclera clear, conjunctiva pink Ears- hearing intact Oropharynx- clear Neck- supple, no JVP Lymph- no cervical lymphadenopathy Lungs- Clear to ausculation bilaterally, normal work of breathing Heart- irregular rate and rhythm, 2/6 SEM LUSB (mid peaking) GI- soft, NT, ND, + BS Extremities- no clubbing, cyanosis, trace edema MS- ambulates by wheel chair today, diffuse muscle atrophy  48 hour holter from 04/07/11 reveiewed today,  Reveals heart rate range 34bpm (while sleeping)-102 bpm,  Average HR 57   Assessment and Plan:

## 2011-06-12 NOTE — Patient Instructions (Signed)
Your physician wants you to follow-up in: 6 months with Dr. Wall.  You will receive a reminder letter in the mail two months in advance. If you don't receive a letter, please call our office to schedule the follow-up appointment.  

## 2011-06-12 NOTE — Assessment & Plan Note (Signed)
Likely permanent Continue coumadin and rate control long term No significant elevation in heart rates by recent holter monitor

## 2011-06-12 NOTE — Assessment & Plan Note (Signed)
Stable No change required today  As above

## 2011-06-12 NOTE — Progress Notes (Signed)
Addended by: Kem Parkinson on: 06/12/2011 03:00 PM   Modules accepted: Orders

## 2011-06-19 ENCOUNTER — Encounter: Payer: Medicare Other | Attending: Physical Medicine & Rehabilitation | Admitting: Physical Medicine & Rehabilitation

## 2011-07-22 ENCOUNTER — Encounter (HOSPITAL_COMMUNITY): Payer: Self-pay | Admitting: Emergency Medicine

## 2011-07-22 ENCOUNTER — Emergency Department (HOSPITAL_COMMUNITY)
Admission: EM | Admit: 2011-07-22 | Discharge: 2011-07-22 | Disposition: A | Discharge status: Home / Self Care | Payer: Medicare Other | Source: Home / Self Care | Attending: Emergency Medicine | Admitting: Emergency Medicine

## 2011-07-22 ENCOUNTER — Emergency Department (HOSPITAL_COMMUNITY): Payer: Medicare Other

## 2011-07-22 ENCOUNTER — Other Ambulatory Visit: Payer: Self-pay

## 2011-07-22 DIAGNOSIS — R4182 Altered mental status, unspecified: Secondary | ICD-10-CM | POA: Insufficient documentation

## 2011-07-22 DIAGNOSIS — I120 Hypertensive chronic kidney disease with stage 5 chronic kidney disease or end stage renal disease: Secondary | ICD-10-CM | POA: Insufficient documentation

## 2011-07-22 DIAGNOSIS — I4891 Unspecified atrial fibrillation: Secondary | ICD-10-CM | POA: Insufficient documentation

## 2011-07-22 DIAGNOSIS — Z7982 Long term (current) use of aspirin: Secondary | ICD-10-CM | POA: Insufficient documentation

## 2011-07-22 DIAGNOSIS — R011 Cardiac murmur, unspecified: Secondary | ICD-10-CM | POA: Insufficient documentation

## 2011-07-22 DIAGNOSIS — Z862 Personal history of diseases of the blood and blood-forming organs and certain disorders involving the immune mechanism: Secondary | ICD-10-CM | POA: Insufficient documentation

## 2011-07-22 DIAGNOSIS — R5381 Other malaise: Secondary | ICD-10-CM | POA: Insufficient documentation

## 2011-07-22 DIAGNOSIS — E119 Type 2 diabetes mellitus without complications: Secondary | ICD-10-CM | POA: Insufficient documentation

## 2011-07-22 DIAGNOSIS — Z79899 Other long term (current) drug therapy: Secondary | ICD-10-CM | POA: Insufficient documentation

## 2011-07-22 DIAGNOSIS — R410 Disorientation, unspecified: Secondary | ICD-10-CM

## 2011-07-22 DIAGNOSIS — F29 Unspecified psychosis not due to a substance or known physiological condition: Secondary | ICD-10-CM | POA: Insufficient documentation

## 2011-07-22 DIAGNOSIS — Z8673 Personal history of transient ischemic attack (TIA), and cerebral infarction without residual deficits: Secondary | ICD-10-CM | POA: Insufficient documentation

## 2011-07-22 DIAGNOSIS — R5383 Other fatigue: Secondary | ICD-10-CM | POA: Insufficient documentation

## 2011-07-22 DIAGNOSIS — Z992 Dependence on renal dialysis: Secondary | ICD-10-CM | POA: Insufficient documentation

## 2011-07-22 DIAGNOSIS — Z8639 Personal history of other endocrine, nutritional and metabolic disease: Secondary | ICD-10-CM | POA: Insufficient documentation

## 2011-07-22 DIAGNOSIS — G40909 Epilepsy, unspecified, not intractable, without status epilepticus: Secondary | ICD-10-CM | POA: Insufficient documentation

## 2011-07-22 DIAGNOSIS — N186 End stage renal disease: Secondary | ICD-10-CM | POA: Insufficient documentation

## 2011-07-22 LAB — COMPREHENSIVE METABOLIC PANEL
Albumin: 2.8 g/dL — ABNORMAL LOW (ref 3.5–5.2)
Alkaline Phosphatase: 86 U/L (ref 39–117)
BUN: 52 mg/dL — ABNORMAL HIGH (ref 6–23)
Calcium: 9.2 mg/dL (ref 8.4–10.5)
GFR calc Af Amer: 7 mL/min — ABNORMAL LOW (ref >90)
Potassium: 5 mEq/L (ref 3.5–5.1)
Total Protein: 6.8 g/dL (ref 6.0–8.3)

## 2011-07-22 LAB — PROTIME-INR
INR: 2.3 — ABNORMAL HIGH (ref 0.00–1.49)
Prothrombin Time: 25.7 seconds — ABNORMAL HIGH (ref 11.6–15.2)

## 2011-07-22 LAB — LACTIC ACID, PLASMA: Lactic Acid, Venous: 1.3 mmol/L (ref 0.5–2.2)

## 2011-07-22 NOTE — ED Notes (Signed)
Patient transported to CT 

## 2011-07-22 NOTE — ED Notes (Signed)
Per ems family went to wake up patient to get him ready for dialysis and he was hard to wake up initial pressure was 90palp but dropped to 60palp when sitting up #18 L AC, dialysis shunt in right

## 2011-07-22 NOTE — ED Notes (Signed)
Return from CT

## 2011-07-22 NOTE — ED Notes (Signed)
Pt undressed and placed in gown. Pt placed on cardiac monitor, bp cuff, and pulse ox.  

## 2011-07-22 NOTE — ED Provider Notes (Signed)
History     CSN: [id number] Arrival date & time: 07/22/2011  7:04 AM   First MD Initiated Contact with Patient 07/22/11 [phone]      Chief Complaint  Patient presents with  . Hypotension    (Consider location/radiation/quality/duration/timing/severity/associated sxs/prior treatment) HPI The patient presents following a brief episode of AMS.  He recalls awakening "late" just pta, and soon after getting up, he had a transient episode of confusion. He notes that prior to this morning he has been in his usual state of health, including good medication compliance, good attendance at dialysis sessions. This morning's episode lasted momentarily, resolved spontaneously, and per reports the patient's blood pressure during the episode was low. On arrival the patient has no complaints, notes that he is in his usual state of health. Past Medical History  Diagnosis Date  . End stage renal disease     on hemodialysis  . Cervical stenosis of spinal canal     s/p C3-C6 laminectomy with Dr. Venetia Maxon and Dr. Jeral Fruit  . Anemia     chronic disease  . Atrial fibrillation     coumadin followed by SNF, longstanding persistent and probably permanent afib  . Aortic stenosis     Echo 02/12/11: moderate aortic stenosis, mean gradient 21,  . Hyperparathyroidism, secondary   . Diabetes mellitus   . Seizure disorder   . Neuropathy     diabetec  . Hypertension   . Hyperkalemia   . BPH (benign prostatic hypertrophy)   . Gout   . History of stroke   . Renal carcinoma     followed at [facility]; s/p gamma knife surgery  . Carotid bruit     Doppler 7/12: Plaque without significant ICA stenosis  . Bradycardia     asymptomatic  . Atrial enlargement, bilateral     History reviewed. No pertinent past surgical history.  History reviewed. No pertinent family history.  History  Substance Use Topics  . Smoking status: Never Smoker   . Smokeless tobacco: Not on file  . Alcohol Use: No      Review of Systems    Constitutional:       Per HPI, otherwise negative  HENT:       Per HPI, otherwise negative  Eyes: Negative.   Respiratory:       Per HPI, otherwise negative  Cardiovascular:       Per HPI, otherwise negative  Gastrointestinal: Negative for vomiting.  Genitourinary: Negative.   Musculoskeletal:       Per HPI, otherwise negative  Skin: Negative.   Neurological: Positive for weakness. Negative for syncope.    Allergies  Review of patient's allergies indicates no known allergies.  Home Medications   Current Outpatient Rx  Name Route Sig Dispense Refill  . ACETAMINOPHEN 325 MG PO TABS Oral Take 650 mg by mouth as needed. Headache pain or fever    . ALLOPURINOL 100 MG PO TABS Oral Take 100 mg by mouth at bedtime.     Marland Kitchen NEPHRO-VITE 0.8 MG PO TABS Oral Take 0.8 mg by mouth at bedtime.      Marland Kitchen ESCITALOPRAM OXALATE 10 MG PO TABS Oral Take 10 mg by mouth at bedtime.     Marland Kitchen LEVETIRACETAM 250 MG PO TABS Oral Take 250 mg by mouth daily.     Marland Kitchen LEVETIRACETAM 500 MG PO TABS Oral Take 500 mg by mouth at bedtime.     Marland Kitchen POLYETHYLENE GLYCOL 3350 PO PACK Oral Take 17 g by mouth daily. For  constipation     . PREGABALIN 75 MG PO CAPS Oral Take 75 mg by mouth daily.     . SENNA 8.6 MG PO TABS Oral Take 1 tablet by mouth daily.      Marland Kitchen SEVELAMER HCL 800 MG PO TABS Oral Take 2,400 mg by mouth 3 (three) times daily with meals.     . TAMSULOSIN HCL 0.4 MG PO CAPS Oral Take 0.4 mg by mouth daily.     . WARFARIN SODIUM 5 MG PO TABS Oral Take 5 mg by mouth daily.      Marland Kitchen BISACODYL 10 MG RE SUPP Rectal Place 10 mg rectally as needed. constipation    . NITROGLYCERIN 0.4 MG SL SUBL Sublingual Place 0.4 mg under the tongue every 5 (five) minutes as needed. Chest pain      BP 111/59  Pulse 93  Temp(Src) 100.8 F (38.2 C) (Oral)  Resp 18  SpO2 96%  Physical Exam  Nursing note and vitals reviewed. Constitutional: He is oriented to person, place, and time. He appears well-developed and well-nourished. No  distress.  HENT:  Head: Normocephalic and atraumatic.  Eyes: Conjunctivae and EOM are normal.  Cardiovascular: Normal rate, regular rhythm and intact distal pulses.   Murmur heard. Pulmonary/Chest: Effort normal. No stridor. No respiratory distress.  Abdominal: Soft. He exhibits no distension.  Musculoskeletal: He exhibits no edema and no tenderness.       Right upper extremity fistula  Neurological: He is alert and oriented to person, place, and time. No cranial nerve deficit. He exhibits normal muscle tone. Coordination normal.  Skin: Skin is warm.  Psychiatric: He has a normal mood and affect.    ED Course  Procedures (including critical care time)   Labs Reviewed  COMPREHENSIVE METABOLIC PANEL  LACTIC ACID, PLASMA  PROTIME-INR  URINALYSIS, ROUTINE W REFLEX MICROSCOPIC   No results found.   No diagnosis found.  Cardiac monitor: 80s, sinus rhythm, normal Pulse ox 99%, room air, normal  Date: 07/22/2011  Rate: 92  Rhythm: sinus vs. junctional, though sr is more likely  QRS Axis: normal  Intervals: PR prolonged  ST/T Wave abnormalities: normal  Conduction Disutrbances:none  Narrative Interpretation:   Old EKG Reviewed: changes noted ABNL ECG  CT and CXR: no acute findings, both reviewed by me.   MDM  This 70 year old male presents following a transient episode of altered mental status. Per report the patient also was hypotensive. On EEG arrival the patient is normotensive, with no complaints, nor any notable physical exam findings. The patient's labs are reassuring, with an absence of notable electrolyte abnormalities. The patient's CT and chest x-ray are both similarly reassuring. Given the absence of notable findings, the patient is appropriate for discharged to followup with primary care physician, and to attend today's dialysis session.  Given his history of end-stage renal disease, CAD, prior stroke these were all considerations, though there is no supporting  evidence for admission for further evaluation of these conditions.  Gerhard Munch, MD 07/22/11 [phone]

## 2011-07-23 ENCOUNTER — Encounter (HOSPITAL_COMMUNITY): Payer: Self-pay | Admitting: Physical Medicine and Rehabilitation

## 2011-07-23 ENCOUNTER — Other Ambulatory Visit: Payer: Self-pay

## 2011-07-23 ENCOUNTER — Inpatient Hospital Stay (HOSPITAL_COMMUNITY)
Admission: EM | Admit: 2011-07-23 | Discharge: 2011-07-28 | DRG: 865 | Disposition: A | Discharge status: Home / Self Care | Payer: Medicare Other | Attending: Internal Medicine | Admitting: Internal Medicine

## 2011-07-23 ENCOUNTER — Emergency Department (HOSPITAL_COMMUNITY): Payer: Medicare Other

## 2011-07-23 DIAGNOSIS — D631 Anemia in chronic kidney disease: Secondary | ICD-10-CM | POA: Diagnosis present

## 2011-07-23 DIAGNOSIS — I959 Hypotension, unspecified: Secondary | ICD-10-CM | POA: Diagnosis present

## 2011-07-23 DIAGNOSIS — N186 End stage renal disease: Secondary | ICD-10-CM | POA: Diagnosis present

## 2011-07-23 DIAGNOSIS — Z66 Do not resuscitate: Secondary | ICD-10-CM | POA: Diagnosis present

## 2011-07-23 DIAGNOSIS — Z85528 Personal history of other malignant neoplasm of kidney: Secondary | ICD-10-CM

## 2011-07-23 DIAGNOSIS — E1149 Type 2 diabetes mellitus with other diabetic neurological complication: Secondary | ICD-10-CM | POA: Diagnosis present

## 2011-07-23 DIAGNOSIS — I350 Nonrheumatic aortic (valve) stenosis: Secondary | ICD-10-CM

## 2011-07-23 DIAGNOSIS — E1142 Type 2 diabetes mellitus with diabetic polyneuropathy: Secondary | ICD-10-CM | POA: Diagnosis present

## 2011-07-23 DIAGNOSIS — R001 Bradycardia, unspecified: Secondary | ICD-10-CM

## 2011-07-23 DIAGNOSIS — A419 Sepsis, unspecified organism: Secondary | ICD-10-CM | POA: Diagnosis present

## 2011-07-23 DIAGNOSIS — E119 Type 2 diabetes mellitus without complications: Secondary | ICD-10-CM | POA: Diagnosis present

## 2011-07-23 DIAGNOSIS — G8929 Other chronic pain: Secondary | ICD-10-CM | POA: Diagnosis present

## 2011-07-23 DIAGNOSIS — B9789 Other viral agents as the cause of diseases classified elsewhere: Principal | ICD-10-CM | POA: Diagnosis present

## 2011-07-23 DIAGNOSIS — I120 Hypertensive chronic kidney disease with stage 5 chronic kidney disease or end stage renal disease: Secondary | ICD-10-CM | POA: Diagnosis present

## 2011-07-23 DIAGNOSIS — I4891 Unspecified atrial fibrillation: Secondary | ICD-10-CM | POA: Diagnosis present

## 2011-07-23 DIAGNOSIS — I10 Essential (primary) hypertension: Secondary | ICD-10-CM | POA: Diagnosis present

## 2011-07-23 DIAGNOSIS — N400 Enlarged prostate without lower urinary tract symptoms: Secondary | ICD-10-CM | POA: Diagnosis present

## 2011-07-23 DIAGNOSIS — G40909 Epilepsy, unspecified, not intractable, without status epilepticus: Secondary | ICD-10-CM | POA: Diagnosis present

## 2011-07-23 DIAGNOSIS — I359 Nonrheumatic aortic valve disorder, unspecified: Secondary | ICD-10-CM | POA: Diagnosis present

## 2011-07-23 DIAGNOSIS — Z7901 Long term (current) use of anticoagulants: Secondary | ICD-10-CM

## 2011-07-23 DIAGNOSIS — M949 Disorder of cartilage, unspecified: Secondary | ICD-10-CM | POA: Diagnosis present

## 2011-07-23 DIAGNOSIS — M899 Disorder of bone, unspecified: Secondary | ICD-10-CM | POA: Diagnosis present

## 2011-07-23 DIAGNOSIS — Z992 Dependence on renal dialysis: Secondary | ICD-10-CM

## 2011-07-23 LAB — COMPREHENSIVE METABOLIC PANEL
ALT: 15 U/L (ref 0–53)
AST: 20 U/L (ref 0–37)
Albumin: 2.5 g/dL — ABNORMAL LOW (ref 3.5–5.2)
CO2: 30 mEq/L (ref 19–32)
Chloride: 98 mEq/L (ref 96–112)
Creatinine, Ser: 5.28 mg/dL — ABNORMAL HIGH (ref 0.50–1.35)
GFR calc non Af Amer: 10 mL/min — ABNORMAL LOW (ref >90)
Sodium: 138 mEq/L (ref 135–145)
Total Bilirubin: 0.7 mg/dL (ref 0.3–1.2)

## 2011-07-23 LAB — GLUCOSE, CAPILLARY: Glucose-Capillary: 168 mg/dL — ABNORMAL HIGH (ref 70–99)

## 2011-07-23 LAB — CARDIAC PANEL(CRET KIN+CKTOT+MB+TROPI)
CK, MB: 4.6 ng/mL — ABNORMAL HIGH (ref 0.3–4.0)
CK, MB: 4.6 ng/mL — ABNORMAL HIGH (ref 0.3–4.0)
Relative Index: 2 (ref 0.0–2.5)
Relative Index: 1.9 (ref 0.0–2.5)
Total CK: 235 U/L — ABNORMAL HIGH (ref 7–232)
Total CK: 250 U/L — ABNORMAL HIGH (ref 7–232)
Troponin I: 0.64 ng/mL (ref <0.30)

## 2011-07-23 LAB — CBC
MCHC: 31.8 g/dL (ref 30.0–36.0)
Platelets: 181 10*3/uL (ref 150–400)
RDW: 17.7 % — ABNORMAL HIGH (ref 11.5–15.5)
WBC: 14.5 10*3/uL — ABNORMAL HIGH (ref 4.0–10.5)

## 2011-07-23 LAB — PROTIME-INR: Prothrombin Time: 22.3 seconds — ABNORMAL HIGH (ref 11.6–15.2)

## 2011-07-23 LAB — DIFFERENTIAL
Basophils Absolute: 0 10*3/uL (ref 0.0–0.1)
Basophils Relative: 0 % (ref 0–1)
Lymphocytes Relative: 10 % — ABNORMAL LOW (ref 12–46)
Neutro Abs: 10.6 10*3/uL — ABNORMAL HIGH (ref 1.7–7.7)
Neutrophils Relative %: 73 % (ref 43–77)

## 2011-07-23 LAB — D-DIMER, QUANTITATIVE: D-Dimer, Quant: 0.91 ug/mL-FEU — ABNORMAL HIGH (ref 0.00–0.48)

## 2011-07-23 LAB — MRSA PCR SCREENING: MRSA by PCR: NEGATIVE

## 2011-07-23 MED ORDER — INSULIN ASPART 100 UNIT/ML ~~LOC~~ SOLN
0.0000 [IU] | Freq: Three times a day (TID) | SUBCUTANEOUS | Status: DC
  Administered 2011-07-25: 2 [IU] via SUBCUTANEOUS
  Administered 2011-07-26: 1 [IU] via SUBCUTANEOUS
  Administered 2011-07-26: 2 [IU] via SUBCUTANEOUS
  Administered 2011-07-27 – 2011-07-28 (×2): 1 [IU] via SUBCUTANEOUS
  Filled 2011-07-23: qty 3

## 2011-07-23 MED ORDER — LEVETIRACETAM 250 MG PO TABS
250.0000 mg | ORAL_TABLET | Freq: Every day | ORAL | Status: DC
  Administered 2011-07-24 – 2011-07-28 (×5): 250 mg via ORAL
  Filled 2011-07-23 (×5): qty 1

## 2011-07-23 MED ORDER — ACETAMINOPHEN 650 MG RE SUPP: 650.0000 mg | Freq: Four times a day (QID) | RECTAL | Status: DC | PRN

## 2011-07-23 MED ORDER — SEVELAMER HCL 800 MG PO TABS
2400.0000 mg | ORAL_TABLET | Freq: Three times a day (TID) | ORAL | Status: DC
  Filled 2011-07-23 (×5): qty 3

## 2011-07-23 MED ORDER — TAMSULOSIN HCL 0.4 MG PO CAPS
0.4000 mg | ORAL_CAPSULE | Freq: Every day | ORAL | Status: DC
  Administered 2011-07-23: 0.4 mg via ORAL
  Filled 2011-07-23 (×2): qty 1

## 2011-07-23 MED ORDER — BISACODYL 10 MG RE SUPP: 10.0000 mg | RECTAL | Status: DC | PRN

## 2011-07-23 MED ORDER — ACETAMINOPHEN 325 MG PO TABS
650.0000 mg | ORAL_TABLET | Freq: Four times a day (QID) | ORAL | Status: DC | PRN
  Administered 2011-07-25 (×2): 650 mg via ORAL
  Filled 2011-07-23: qty 1
  Filled 2011-07-23: qty 2

## 2011-07-23 MED ORDER — PIPERACILLIN-TAZOBACTAM IN DEX 2-0.25 GM/50ML IV SOLN
2.2500 g | INTRAVENOUS | Status: AC
  Administered 2011-07-23: 2.25 g via INTRAVENOUS
  Filled 2011-07-23: qty 50

## 2011-07-23 MED ORDER — SODIUM CHLORIDE 0.9 % IV SOLN: Freq: Once | INTRAVENOUS | Status: DC

## 2011-07-23 MED ORDER — VANCOMYCIN HCL 1000 MG IV SOLR
750.0000 mg | INTRAVENOUS | Status: DC
  Administered 2011-07-25: 750 mg via INTRAVENOUS
  Filled 2011-07-23 (×2): qty 750

## 2011-07-23 MED ORDER — WARFARIN SODIUM 7.5 MG PO TABS
7.5000 mg | ORAL_TABLET | Freq: Once | ORAL | Status: AC
  Administered 2011-07-24: 7.5 mg via ORAL
  Filled 2011-07-23 (×2): qty 1

## 2011-07-23 MED ORDER — VANCOMYCIN HCL IN DEXTROSE 1-5 GM/200ML-% IV SOLN
1000.0000 mg | Freq: Once | INTRAVENOUS | Status: AC
  Administered 2011-07-23: 1000 mg via INTRAVENOUS
  Filled 2011-07-23: qty 200

## 2011-07-23 MED ORDER — LEVETIRACETAM 500 MG PO TABS
500.0000 mg | ORAL_TABLET | Freq: Every day | ORAL | Status: DC
  Administered 2011-07-23 – 2011-07-27 (×5): 500 mg via ORAL
  Filled 2011-07-23 (×6): qty 1

## 2011-07-23 MED ORDER — RENA-VITE PO TABS
1.0000 | ORAL_TABLET | Freq: Every day | ORAL | Status: DC
  Administered 2011-07-23 – 2011-07-27 (×5): 1 via ORAL
  Filled 2011-07-23 (×6): qty 1

## 2011-07-23 MED ORDER — ONDANSETRON HCL 4 MG/2ML IJ SOLN: 4.0000 mg | Freq: Four times a day (QID) | INTRAMUSCULAR | Status: DC | PRN

## 2011-07-23 MED ORDER — ALLOPURINOL 100 MG PO TABS
100.0000 mg | ORAL_TABLET | Freq: Every day | ORAL | Status: DC
  Administered 2011-07-23 – 2011-07-27 (×5): 100 mg via ORAL
  Filled 2011-07-23 (×6): qty 1

## 2011-07-23 MED ORDER — PREGABALIN 50 MG PO CAPS
75.0000 mg | ORAL_CAPSULE | Freq: Every day | ORAL | Status: DC
  Administered 2011-07-24 – 2011-07-28 (×5): 75 mg via ORAL
  Filled 2011-07-23 (×5): qty 1

## 2011-07-23 MED ORDER — POLYETHYLENE GLYCOL 3350 17 G PO PACK
17.0000 g | PACK | Freq: Every day | ORAL | Status: DC
  Administered 2011-07-24 – 2011-07-25 (×2): 17 g via ORAL
  Filled 2011-07-23 (×5): qty 1

## 2011-07-23 MED ORDER — WARFARIN SODIUM 5 MG PO TABS: 5.0000 mg | ORAL_TABLET | Freq: Every day | ORAL | Status: DC

## 2011-07-23 MED ORDER — ONDANSETRON HCL 4 MG PO TABS: 4.0000 mg | ORAL_TABLET | Freq: Four times a day (QID) | ORAL | Status: DC | PRN

## 2011-07-23 MED ORDER — VANCOMYCIN HCL 500 MG IV SOLR
500.0000 mg | Freq: Once | INTRAVENOUS | Status: AC
  Administered 2011-07-23: 500 mg via INTRAVENOUS
  Filled 2011-07-23: qty 500

## 2011-07-23 MED ORDER — ALBUTEROL SULFATE (5 MG/ML) 0.5% IN NEBU: 2.5000 mg | INHALATION_SOLUTION | RESPIRATORY_TRACT | Status: DC | PRN

## 2011-07-23 MED ORDER — SENNA 8.6 MG PO TABS
1.0000 | ORAL_TABLET | Freq: Every day | ORAL | Status: DC
  Administered 2011-07-24 – 2011-07-25 (×2): 8.6 mg via ORAL
  Filled 2011-07-23 (×5): qty 1

## 2011-07-23 MED ORDER — PIPERACILLIN-TAZOBACTAM IN DEX 2-0.25 GM/50ML IV SOLN
2.2500 g | Freq: Three times a day (TID) | INTRAVENOUS | Status: DC
  Administered 2011-07-23 – 2011-07-28 (×13): 2.25 g via INTRAVENOUS
  Filled 2011-07-23 (×18): qty 50

## 2011-07-23 MED ORDER — SODIUM CHLORIDE 0.9 % IV BOLUS (SEPSIS)
250.0000 mL | Freq: Once | INTRAVENOUS | Status: AC
  Administered 2011-07-23: 250 mL via INTRAVENOUS

## 2011-07-23 NOTE — ED Provider Notes (Signed)
History     CSN: [id number] Arrival date & time: 07/23/2011 10:23 AM   First MD Initiated Contact with Patient 07/23/11 1035      Chief Complaint  Patient presents with  . Hypotension  . Fever    (Consider location/radiation/quality/duration/timing/severity/associated sxs/prior treatment) HPI Comments: The patient is a 70 year old man who was brought over because his blood pressure was low. He says that his daughter panicked and sent him to the ED. He was here yesterday for similar problem, and laboratory testing at that time was essentially negative. He had gone to have his hemodialysis yesterday. He was noted by EMS to have a low-grade fever. Review of prior charts shows that he has end-stage renal disease, and is on hemodialysis Tuesdays, Thursdays, and Saturday. He has had spinal stenosis of the cervical spine and had decompressive surgery. He has had a prior stroke.  Patient is a 70 y.o. male presenting with weakness.  Weakness The primary symptoms include fever. The symptoms began yesterday. The symptoms are improving. The neurological symptoms are diffuse.  Additional symptoms include weakness. Associated symptoms comments: Hypotension and lowgrade fever.. Medical issues also include cerebral vascular accident. Associated medical issues comments: Endstage renal disease, on hemodialysis..    Past Medical History  Diagnosis Date  . End stage renal disease     on hemodialysis  . Cervical stenosis of spinal canal     s/p C3-C6 laminectomy with Dr. Venetia Maxon and Dr. Jeral Fruit  . Anemia     chronic disease  . Atrial fibrillation     coumadin followed by SNF, longstanding persistent and probably permanent afib  . Aortic stenosis     Echo 02/12/11: moderate aortic stenosis, mean gradient 21,  . Hyperparathyroidism, secondary   . Diabetes mellitus   . Seizure disorder   . Neuropathy     diabetec  . Hypertension   . Hyperkalemia   . BPH (benign prostatic hypertrophy)   . Gout   .  History of stroke   . Renal carcinoma     followed at [facility]; s/p gamma knife surgery  . Carotid bruit     Doppler 7/12: Plaque without significant ICA stenosis  . Bradycardia     asymptomatic  . Atrial enlargement, bilateral     Past Surgical History  Procedure Date  . Foot surgery   . Radiofrequency ablation kidney     History reviewed. No pertinent family history.  History  Substance Use Topics  . Smoking status: Never Smoker   . Smokeless tobacco: Not on file  . Alcohol Use: No      Review of Systems  Constitutional: Positive for fever.  HENT: Negative.   Eyes: Negative.   Respiratory: Negative.   Cardiovascular:       Hypotension   Gastrointestinal: Negative.   Genitourinary: Negative.   Musculoskeletal: Negative.   Neurological: Positive for weakness.  Psychiatric/Behavioral: Negative.     Allergies  Review of patient's allergies indicates no known allergies.  Home Medications   Current Outpatient Rx  Name Route Sig Dispense Refill  . ACETAMINOPHEN 325 MG PO TABS Oral Take 650 mg by mouth as needed. Headache pain or fever    . ALLOPURINOL 100 MG PO TABS Oral Take 100 mg by mouth at bedtime.     Marland Kitchen NEPHRO-VITE 0.8 MG PO TABS Oral Take 0.8 mg by mouth at bedtime.      Marland Kitchen BISACODYL 10 MG RE SUPP Rectal Place 10 mg rectally as needed. constipation    . ESCITALOPRAM  OXALATE 10 MG PO TABS Oral Take 10 mg by mouth at bedtime.     Marland Kitchen LEVETIRACETAM 250 MG PO TABS Oral Take 250 mg by mouth daily.      Marland Kitchen LEVETIRACETAM 500 MG PO TABS Oral Take 500 mg by mouth at bedtime.     Marland Kitchen NITROGLYCERIN 0.4 MG SL SUBL Sublingual Place 0.4 mg under the tongue every 5 (five) minutes as needed. Chest pain    . POLYETHYLENE GLYCOL 3350 PO PACK Oral Take 17 g by mouth daily. For constipation     . PREGABALIN 75 MG PO CAPS Oral Take 75 mg by mouth daily.     . SENNA 8.6 MG PO TABS Oral Take 1 tablet by mouth daily.      Marland Kitchen SEVELAMER HCL 800 MG PO TABS Oral Take 2,400 mg by mouth 3  (three) times daily with meals.     . TAMSULOSIN HCL 0.4 MG PO CAPS Oral Take 0.4 mg by mouth daily.     . WARFARIN SODIUM 5 MG PO TABS Oral Take 5 mg by mouth daily.        BP 86/52  Pulse 73  Temp(Src) 100.4 F (38 C) (Oral)  Resp 22  SpO2 98%  Physical Exam  Constitutional: He appears well-developed and well-nourished. No distress.  HENT:  Head: Normocephalic and atraumatic.  Right Ear: External ear normal.  Left Ear: External ear normal.  Mouth/Throat: Oropharynx is clear and moist.  Eyes: Conjunctivae and EOM are normal. Pupils are equal, round, and reactive to light.  Neck: Normal range of motion. Neck supple.  Cardiovascular: Normal rate, regular rhythm and normal heart sounds.   Pulmonary/Chest: Effort normal and breath sounds normal.  Abdominal: Soft. Bowel sounds are normal.  Musculoskeletal: Normal range of motion.       AV dialysis access in R forearm.    Skin: Skin is warm and dry.  Psychiatric: He has a normal mood and affect. His behavior is normal.    ED Course  Procedures (including critical care time)  Labs Reviewed - No data to display Dg Chest 2 View  07/22/2011  *RADIOLOGY REPORT*  Clinical Data: Altered mental status.  CHEST - 2 VIEW  Comparison: Chest x-ray 02/09/2011.  Findings: The cardiac silhouette, mediastinal and hilar contours are within normal limits and stable.  The lungs are clear.  No pleural effusion.  The bony thorax is intact.  IMPRESSION: No acute cardiopulmonary findings.  Original Report Authenticated By: P. Loralie Champagne, M.D.   Ct Head Wo Contrast  07/22/2011  *RADIOLOGY REPORT*  Clinical Data: Altered mental status.  CT HEAD WITHOUT CONTRAST  Technique:  Contiguous axial images were obtained from the base of the skull through the vertex without contrast.  Comparison: Head CT 02/09/2011.  Findings: Stable age related cerebral atrophy, ventriculomegaly and patchy periventricular white matter disease.  No extra-axial fluid collections  are identified.  No CT findings for acute hemispheric infarction and/or intracranial hemorrhage.  No mass lesions.  The brainstem and cerebellum are grossly normal.  The bony structures are intact.  The paranasal sinuses and mastoid air cells are clear. Globes are intact.  IMPRESSION:  1.  Stable age related cerebral atrophy, ventriculomegaly and periventricular white matter disease. 2.  No acute intracranial findings.  Original Report Authenticated By: P. Loralie Champagne, M.D.   10:55 AM  Date: 07/23/2011  Rate: 85  Rhythm: atrial fibrillation  QRS Axis: normal  Intervals: normal  ST/T Wave abnormalities: normal  Conduction Disutrbances:none  Narrative  Interpretation: Abnormal EKG  Old EKG Reviewed: unchanged  1:02 PM Results for orders placed during the hospital encounter of 07/23/11  CBC      Component Value Range   WBC 14.5 (*) 4.0 - 10.5 (K/uL)   RBC 3.48 (*) 4.22 - 5.81 (MIL/uL)   Hemoglobin 9.6 (*) 13.0 - 17.0 (g/dL)   HCT 45.4 (*) 09.8 - 52.0 (%)   MCV 86.8  78.0 - 100.0 (fL)   MCH 27.6  26.0 - 34.0 (pg)   MCHC 31.8  30.0 - 36.0 (g/dL)   RDW 11.9 (*) 14.7 - 15.5 (%)   Platelets 181  150 - 400 (K/uL)  DIFFERENTIAL      Component Value Range   Neutrophils Relative 73  43 - 77 (%)   Neutro Abs 10.6 (*) 1.7 - 7.7 (K/uL)   Lymphocytes Relative 10 (*) 12 - 46 (%)   Lymphs Abs 1.4  0.7 - 4.0 (K/uL)   Monocytes Relative 17 (*) 3 - 12 (%)   Monocytes Absolute 2.4 (*) 0.1 - 1.0 (K/uL)   Eosinophils Relative 0  0 - 5 (%)   Eosinophils Absolute 0.0  0.0 - 0.7 (K/uL)   Basophils Relative 0  0 - 1 (%)   Basophils Absolute 0.0  0.0 - 0.1 (K/uL)  COMPREHENSIVE METABOLIC PANEL      Component Value Range   Sodium 138  135 - 145 (mEq/L)   Potassium 3.9  3.5 - 5.1 (mEq/L)   Chloride 98  96 - 112 (mEq/L)   CO2 30  19 - 32 (mEq/L)   Glucose, Bld 104 (*) 70 - 99 (mg/dL)   BUN 30 (*) 6 - 23 (mg/dL)   Creatinine, Ser 8.29 (*) 0.50 - 1.35 (mg/dL)   Calcium 9.6  8.4 - 56.2 (mg/dL)   Total  Protein 7.2  6.0 - 8.3 (g/dL)   Albumin 2.5 (*) 3.5 - 5.2 (g/dL)   AST 20  0 - 37 (U/L)   ALT 15  0 - 53 (U/L)   Alkaline Phosphatase 97  39 - 117 (U/L)   Total Bilirubin 0.7  0.3 - 1.2 (mg/dL)   GFR calc non Af Amer 10 (*) >90 (mL/min)   GFR calc Af Amer 11 (*) >90 (mL/min)  CARDIAC PANEL(CRET KIN+CKTOT+MB+TROPI)      Component Value Range   Total CK 205  7 - 232 (U/L)   CK, MB 4.6 (*) 0.3 - 4.0 (ng/mL)   Troponin I 0.64 (*) <0.30 (ng/mL)   Relative Index 2.2  0.0 - 2.5   D-DIMER, QUANTITATIVE      Component Value Range   D-Dimer, Quant 0.91 (*) 0.00 - 0.48 (ug/mL-FEU)   Dg Chest 2 View  07/23/2011  *RADIOLOGY REPORT*  Clinical Data: Low grade fever, hypertension, end stage renal disease  CHEST - 2 VIEW  Comparison: 07/22/2011; 02/09/2011  Findings: Unchanged cardiac silhouette and mediastinal contours. No focal parenchymal opacities with apparent opacity overlying the right lower lung favored to be artifactual secondary to a confluence of overlying osseous and soft tissue structures.  No pleural effusion or pneumothorax.  Grossly unchanged bones.  IMPRESSION: No acute cardiopulmonary disease.  Specifically, no evidence of pneumonia.  Original Report Authenticated By: Waynard Reeds, M.D.   Dg Chest 2 View  07/22/2011  *RADIOLOGY REPORT*  Clinical Data: Altered mental status.  CHEST - 2 VIEW  Comparison: Chest x-ray 02/09/2011.  Findings: The cardiac silhouette, mediastinal and hilar contours are within normal limits and stable.  The  lungs are clear.  No pleural effusion.  The bony thorax is intact.  IMPRESSION: No acute cardiopulmonary findings.  Original Report Authenticated By: P. Loralie Champagne, M.D.   Ct Head Wo Contrast  07/22/2011  *RADIOLOGY REPORT*  Clinical Data: Altered mental status.  CT HEAD WITHOUT CONTRAST  Technique:  Contiguous axial images were obtained from the base of the skull through the vertex without contrast.  Comparison: Head CT 02/09/2011.  Findings: Stable  age related cerebral atrophy, ventriculomegaly and patchy periventricular white matter disease.  No extra-axial fluid collections are identified.  No CT findings for acute hemispheric infarction and/or intracranial hemorrhage.  No mass lesions.  The brainstem and cerebellum are grossly normal.  The bony structures are intact.  The paranasal sinuses and mastoid air cells are clear. Globes are intact.  IMPRESSION:  1.  Stable age related cerebral atrophy, ventriculomegaly and periventricular white matter disease. 2.  No acute intracranial findings.  Original Report Authenticated By: P. Loralie Champagne, M.D.    1:03 PM Patient's lab tests show an elevated troponin I of 0.64 and a CK-MB of 4.6. His renal function tests showed BUN of 30 and creatinine 5.28 indicating renal failure. His EKG had shown atrial fibrillation but no acute change. He has been seen by [facility] cardiology in the past, and a call has been placed to them.  1:09 PM Case discussed with Dr. Antoine Poche; [facility] will see pt.  3:40 PM Pt seen by Dr. Marca Ancona of [facility].  He is concerned that pt could be septic.  Will get blood cultures and lactic acid and procalcitonin.     4:14 PM Case discussed with Dr. Eda Paschal.  Admit to telemety to Team 5, Dr. Elsie Lincoln.     1. Hypotension           Carleene Cooper III, MD 07/23/11 [phone]

## 2011-07-23 NOTE — ED Notes (Signed)
Daughter: Dorna Mai-  [phone]   Or [phone] please call if needed

## 2011-07-23 NOTE — ED Notes (Signed)
Admitting physician at the bedside speaking with patient and family. Pt remains hypotensive. Fluid bolus and antibiotics infusing at the time. Will continue to monitor closely.

## 2011-07-23 NOTE — ED Notes (Signed)
Pt returned to exam room from x-ray. Placed back on cardiac monitor. Vital signs stable. No signs of distress at the present. Denies pain at the time.

## 2011-07-23 NOTE — H&P (Addendum)
PCP:   Candi Leash, MD   Chief Complaint:  Generalized weakness and lethargy  HPI: Patient is a 70 year old African American male patient with history of end-stage renal disease on hemodialysis Tuesdays Thursdays and Saturdays, atrial fibrillation on Coumadin, aortic stenosis, type 2 diabetes mellitus, hypertension, seizure disorder who is brought to the emergency room for the second time in 2 days for generalized weakness and lethargy. Patient is unable to provide significant history secondary to his drowsy state. He however is arousable and indicates that apart from weakness he has no other complaints. Patient's daughter and health care power of attorney Ms. Dorna Mai who is at the bedside is providing history. Patient has recently completed a course of doxycycline which was started for urinary tract infection. Patient was in his usual state of health until yesterday morning. When she went to wake him up patient was extremely weak and lethargic. His blood sugars were normal however his blood pressures were low in the 80s. EMS was summoned. Patient was brought to the emergency department. Subsequently he was discharged and went for his scheduled dialysis. Per the historian patient has had issues with low blood pressures post dialysis for the last couple of times. He slept all night which is unusual for him. He usually sits up late and watches movies on his portable DVD player. This morning again the patient was found to be weak and lethargic and somnolent. He felt hot but his temperature was not checked. He however has been eating reasonably. He complained of tinnitus in the left ear but no sore throat or earache. No chest pain, dyspnea, cough. No nausea vomiting abdominal pain or diarrhea. He makes very little urine. He was exposed to the historian who had symptoms of upper respiratory tract infection and myalgia. He was brought to the emergency room where he was found to be hypotensive in the 80s,  mild leukocytosis with and elevated troponins. Cardiology is consulted and do not believe that the elevated troponins are secondary to acute coronary syndrome. He is being treated for presumed sepsis. After IV fluid boluses systolic now is in the 90s.   Past Medical History: Past Medical History  Diagnosis Date  . End stage renal disease     on hemodialysis  . Cervical stenosis of spinal canal     s/p C3-C6 laminectomy with Dr. Venetia Maxon and Dr. Jeral Fruit  . Anemia     chronic disease  . Atrial fibrillation     coumadin followed by SNF, longstanding persistent and probably permanent afib  . Aortic stenosis     Echo 02/12/11: moderate aortic stenosis, mean gradient 21,  . Hyperparathyroidism, secondary   . Diabetes mellitus   . Seizure disorder   . Neuropathy     diabetec  . Hypertension   . Hyperkalemia   . BPH (benign prostatic hypertrophy)   . Gout   . History of stroke   . Renal carcinoma     followed at [facility]; s/p gamma knife surgery  . Carotid bruit     Doppler 7/12: Plaque without significant ICA stenosis  . Bradycardia     asymptomatic  . Atrial enlargement, bilateral     Past Surgical History: Past Surgical History  Procedure Date  . Foot surgery   . Radiofrequency ablation kidney   . Neck surgery     Allergies:  No Known Allergies  Medications: Prior to Admission medications   Medication Sig Start Date End Date Taking? Authorizing Provider  acetaminophen (TYLENOL) 325 MG tablet  Take 650 mg by mouth as needed. Headache pain or fever   Yes Historical Provider, MD  allopurinol (ZYLOPRIM) 100 MG tablet Take 100 mg by mouth at bedtime.    Yes Historical Provider, MD  b complex-vitamin c-folic acid (NEPHRO-VITE) 0.8 MG TABS Take 0.8 mg by mouth at bedtime.     Yes Historical Provider, MD  bisacodyl (BISCOLAX) 10 MG suppository Place 10 mg rectally as needed. constipation   Yes Historical Provider, MD  escitalopram (LEXAPRO) 10 MG tablet Take 10 mg by mouth at bedtime.     Yes Historical Provider, MD  levETIRAcetam (KEPPRA) 250 MG tablet Take 250 mg by mouth daily.     Yes Historical Provider, MD  levETIRAcetam (KEPPRA) 500 MG tablet Take 500 mg by mouth at bedtime.    Yes Historical Provider, MD  nitroGLYCERIN (NITROSTAT) 0.4 MG SL tablet Place 0.4 mg under the tongue every 5 (five) minutes as needed. Chest pain   Yes Historical Provider, MD  polyethylene glycol (MIRALAX / GLYCOLAX) packet Take 17 g by mouth daily. For constipation    Yes Historical Provider, MD  pregabalin (LYRICA) 75 MG capsule Take 75 mg by mouth daily.    Yes Historical Provider, MD  senna (SENOKOT) 8.6 MG TABS Take 1 tablet by mouth daily.     Yes Historical Provider, MD  sevelamer (RENAGEL) 800 MG tablet Take 2,400 mg by mouth 3 (three) times daily with meals.    Yes Historical Provider, MD  Tamsulosin HCl (FLOMAX) 0.4 MG CAPS Take 0.4 mg by mouth daily.    Yes Historical Provider, MD  warfarin (COUMADIN) 5 MG tablet Take 5 mg by mouth daily.     Yes Historical Provider, MD    Family History: History reviewed. No pertinent family history.  Social History:  reports that he has never smoked. He does not have any smokeless tobacco history on file. He reports that he does not drink alcohol or use illicit drugs.  Review of Systems:   all systems reviewed and apart from history of presenting illness or negative. Patient denies any pain or redness at the AV fistula site on his right forearm. He was dialyzed yesterday.   Physical Exam: Filed Vitals:   07/24/11 0000 07/24/11 0023 07/24/11 0339 07/24/11 0400  BP:  87/57 96/56   Pulse: 68 79 72 70  Temp:   97.9 F (36.6 C)   TempSrc:   Oral   Resp: 21 16 15 16   Height:      Weight:   76.5 kg (168 lb 10.4 oz)   SpO2: 100% 98% 98% 99%   General exam: Patient is moderately built and nourished male who is lying supine on the gurney and is in no obvious distress. Head, eyes and ENT: Nontraumatic and normocephalic. Pupils equally reacting to  light and accommodation. Oral mucosa is mildly dry but no other acute findings. Lymphatics: No lymphadenopathy. Neck: Supple. No JVD or carotid bruit. Respiratory system: Clear. No increased work of breathing. Cardiovascular system: First and second heart sounds heard, irregular. No JVD. 2 x 6 systolic ejection murmur at the apex. Gastrointestinal system: Abdomen is nondistended, soft and normal bowel sounds heard. No organomegaly or masses appreciated. Extremities with grade 5 x 5 power symmetrically. Skin: Without any rashes Central nervous system: Patient is somnolent but easily arousable and oriented to person and place but not to time. She obeys commands appropriately. No focal neurological deficits.    Labs on Admission:   Basename 07/24/11 0400 07/23/11 1108  NA 135 138  K 4.1 3.9  CL 97 98  CO2 29 30  GLUCOSE 84 104*  BUN 44* 30*  CREATININE 6.85* 5.28*  CALCIUM 9.5 9.6  MG -- --  PHOS -- --    Basename 07/23/11 1108 07/22/11 0728  AST 20 13  ALT 15 9  ALKPHOS 97 86  BILITOT 0.7 0.4  PROT 7.2 6.8  ALBUMIN 2.5* 2.8*   No results found for this basename: LIPASE:2,AMYLASE:2 in the last 72 hours  Basename 07/24/11 0400 07/23/11 1108  WBC 11.8* 14.5*  NEUTROABS -- 10.6*  HGB 9.0* 9.6*  HCT 28.1* 30.2*  MCV 86.5 86.8  PLT 172 181    Basename 07/24/11 0400 07/23/11 1557 07/23/11 1441  CKTOTAL 365* 250* 235*  CKMB 4.5* 4.7* 4.6*  CKMBINDEX -- -- --  TROPONINI 0.42* 0.83* 0.78*   No results found for this basename: TSH,T4TOTAL,FREET3,T3FREE,THYROIDAB in the last 72 hours No results found for this basename: VITAMINB12:2,FOLATE:2,FERRITIN:2,TIBC:2,IRON:2,RETICCTPCT:2 in the last 72 hours  Radiological Exams on Admission: Dg Chest 2 View  07/23/2011  *RADIOLOGY REPORT*  Clinical Data: Low grade fever, hypertension, end stage renal disease  CHEST - 2 VIEW  Comparison: 07/22/2011; 02/09/2011  Findings: Unchanged cardiac silhouette and mediastinal contours. No focal  parenchymal opacities with apparent opacity overlying the right lower lung favored to be artifactual secondary to a confluence of overlying osseous and soft tissue structures.  No pleural effusion or pneumothorax.  Grossly unchanged bones.  IMPRESSION: No acute cardiopulmonary disease.  Specifically, no evidence of pneumonia.  Original Report Authenticated By: Waynard Reeds, M.D.   Dg Chest 2 View  07/22/2011  *RADIOLOGY REPORT*  Clinical Data: Altered mental status.  CHEST - 2 VIEW  Comparison: Chest x-ray 02/09/2011.  Findings: The cardiac silhouette, mediastinal and hilar contours are within normal limits and stable.  The lungs are clear.  No pleural effusion.  The bony thorax is intact.  IMPRESSION: No acute cardiopulmonary findings.  Original Report Authenticated By: P. Loralie Champagne, M.D.   Ct Head Wo Contrast  07/22/2011  *RADIOLOGY REPORT*  Clinical Data: Altered mental status.  CT HEAD WITHOUT CONTRAST  Technique:  Contiguous axial images were obtained from the base of the skull through the vertex without contrast.  Comparison: Head CT 02/09/2011.  Findings: Stable age related cerebral atrophy, ventriculomegaly and patchy periventricular white matter disease.  No extra-axial fluid collections are identified.  No CT findings for acute hemispheric infarction and/or intracranial hemorrhage.  No mass lesions.  The brainstem and cerebellum are grossly normal.  The bony structures are intact.  The paranasal sinuses and mastoid air cells are clear. Globes are intact.  IMPRESSION:  1.  Stable age related cerebral atrophy, ventriculomegaly and periventricular white matter disease. 2.  No acute intracranial findings.  Original Report Authenticated By: P. Loralie Champagne, M.D.      Assessment/Plan 1. Sepsis, unclear source. Rule out urinary tract infection and the patient was recently being treated for same. Also rule out influenza. Chest x-ray is not suggestive of pneumonia and the AV fistula site  does not look infected. Admit patient to step down unit. Brief small bolus of IV fluids given his history of end-stage renal disease on dialysis. Empirically treat with intravenous vancomycin and Zosyn per pharmacy. Followup on blood cultures, urine cultures, influenza PCR, Procalcitonin and lactate. 2. End-stage renal disease: We'll alert the renal physicians of patient's admission and need for dialysis as per his usual schedule. 3. Type 2 diabetes mellitus: 4 sliding scale  insulin. 4. Anemia: Secondary to chronic kidney disease. Follow daily CBCs. 5. History of seizure disorder: No history of witnessed seizures recently. Continue home dose of Keppra. 6. Elevated troponins, possibly secondary to demand ischemia from hypotension: Cardiology consultation appreciated. Continue to cycle cardiac enzymes. 7. Atrial fibrillation with controlled ventricular rate. Check INR and continue Coumadin per pharmacy. Of note patient has not been on any beta blockers at home. 8. History of aortic stenosis 9. DO NOT RESUSCITATE   Patient's care was discussed in detail with his daughter Ms. Dorna Mai.    Breckon Reeves 07/24/2011, 7:30 AM

## 2011-07-23 NOTE — Consult Note (Signed)
CARDIOLOGY CONSULT NOTE  Patient ID: [name] MRN: [id number] DOB/AGE: September 21, 1940 70 y.o.  Admit date: 07/23/2011 Referring Physician: ER Primary PhysicianCarr, Darrelyn Hillock, RN, RN Primary Cardiologist: [name] Reason for Consultation: Afib, AoV stenosis and hypotension. Active Problems:  End stage renal disease  Atrial fibrillation  Aortic stenosis  HPI: [name] is a 70 y/o patient of Dr.'s Wall and Allred we are following for ongoing assessment and treatment of chronic atrial fib, AoV stenosis, hypertension, who has a history of ESRD with dialysis on Tues, Thurs, Sat.  He comes to ER today after his daughter checked his blood pressure at home. He was feeling a little sluggish and not quite himself.  He was actually in the ER yesterday at [facility] for complaints of dizziness. He was not hypotensive. He has not had any fevers, chills, coughing or nausea.  He is feeling better here in the ER, but remains hypotensive.  Review of systems complete and found to be negative unless listed above   Past Medical History  Diagnosis Date  . End stage renal disease     on hemodialysis  . Cervical stenosis of spinal canal     s/p C3-C6 laminectomy with Dr. Venetia [name] and Dr. Jeral [name]  . Anemia     chronic disease  . Atrial fibrillation     coumadin followed by SNF, longstanding persistent and probably permanent afib  . Aortic stenosis     Echo 02/12/11: moderate aortic stenosis, mean gradient 21,  . Hyperparathyroidism, secondary   . Diabetes mellitus   . Seizure disorder   . Neuropathy     diabetec  . Hypertension   . Hyperkalemia   . BPH (benign prostatic hypertrophy)   . Gout   . History of stroke   . Renal carcinoma     followed at [facility]; s/p gamma knife surgery  . Carotid bruit     Doppler 7/12: Plaque without significant ICA stenosis  . Bradycardia     asymptomatic  . Atrial enlargement, bilateral     History reviewed. No pertinent family history.  History   Social History    . Marital Status: Divorced    Spouse Name: N/A    Number of Children: N/A  . Years of Education: N/A   Occupational History  . Not on file.   Social History Main Topics  . Smoking status: Never Smoker   . Smokeless tobacco: Not on file  . Alcohol Use: No  . Drug Use: No  . Sexually Active: Not on file   Other Topics Concern  . Not on file   Social History Narrative  . No narrative on file    Past Surgical History  Procedure Date  . Foot surgery   . Radiofrequency ablation kidney       (Not in a hospital admission)  Physical Exam: Blood pressure 90/53, pulse 82, temperature 100 F (37.8 C), temperature source Oral, resp. rate 18, SpO2 99.00%.   General: Well developed, well nourished, in no acute distress Head: Eyes PERRLA, No xanthomas.   Normal cephalic and atramatic  Lungs: Clear bilaterally to auscultation and percussion. Heart: Irregular rhythm with 2/6 systolic murmur with radiation in the abdomen.  Pulses are 2+ & equal.            No carotid bruit. No JVD.  No abdominal bruits. No femoral bruits. Abdomen: Bowel sounds are positive, abdomen soft and non-tender without masses or  Hernia's noted. Msk:  Back normal, normal gait. Normal strength and tone for age. Extremities: No clubbing, cyanosis or edema.  DP +1 Dialysis AV shunt in the left arm with fresh dressing. Neuro: Alert and oriented X 3. Psych:  Good affect, responds appropriately Labs:   Lab Results  Component Value Date   WBC 14.5* 07/23/2011   HGB 9.6* 07/23/2011   HCT 30.2* 07/23/2011   MCV 86.8 07/23/2011   PLT 181 07/23/2011     Lab 07/23/11 1108  NA 138  K 3.9  CL 98  CO2 30  BUN 30*  CREATININE 5.28*  CALCIUM 9.6  PROT 7.2  BILITOT 0.7  ALKPHOS 97  ALT 15  AST 20  GLUCOSE 104*   Lab Results  Component Value Date   CKTOTAL 205 07/23/2011   CKMB 4.6* 07/23/2011   TROPONINI 0.64* 07/23/2011    Lab Results  Component Value Date   CHOL 100 02/10/2011    Lab Results  Component Value Date   HDL 49 02/10/2011   Lab Results  Component Value Date   LDLCALC 38 02/10/2011   Lab Results  Component Value Date   TRIG 66 02/10/2011   Lab Results  Component Value Date   CHOLHDL 2.0 02/10/2011   No results found for this basename: LDLDIRECT    INR 2.3 (12/15)   Radiology: Dg Chest 2 View  07/23/2011  *RADIOLOGY REPORT*  Clinical Data: Low grade fever, hypertension, end stage renal disease  CHEST - 2 VIEW  Comparison: 07/22/2011; 02/09/2011  Findings: Unchanged cardiac silhouette and mediastinal contours. No focal parenchymal opacities with apparent opacity overlying the right lower lung favored to be artifactual secondary to a confluence of overlying osseous and soft tissue structures.  No pleural effusion or pneumothorax.  Grossly unchanged bones.  IMPRESSION: No acute cardiopulmonary disease.  Specifically, no evidence of pneumonia.  Original Report Authenticated By: Waynard Reeds, M.D.   Dg Chest 2 View  07/22/2011  *RADIOLOGY REPORT*  Clinical Data: Altered mental status.  CHEST - 2 VIEW  Comparison: Chest x-ray 02/09/2011.  Findings: The cardiac silhouette, mediastinal and hilar contours are within normal limits and stable.  The lungs are clear.  No pleural effusion.  The bony thorax is intact.  IMPRESSION: No acute cardiopulmonary findings.  Original Report Authenticated By: P. Loralie Champagne, M.D.   Ct Head Wo Contrast  07/22/2011  *RADIOLOGY REPORT*  Clinical Data: Altered mental status.  CT HEAD WITHOUT CONTRAST  Technique:  Contiguous axial images were obtained from the base of the skull through the vertex without contrast.  Comparison: Head CT 02/09/2011.  Findings: Stable age related cerebral atrophy, ventriculomegaly and patchy periventricular white matter disease.  No extra-axial fluid collections are identified.  No CT findings for acute hemispheric infarction and/or intracranial hemorrhage.  No mass lesions.  The brainstem and  cerebellum are grossly normal.  The bony structures are intact.  The paranasal sinuses and mastoid air cells are clear. Globes are intact.  IMPRESSION:  1.  Stable age related cerebral atrophy, ventriculomegaly and periventricular white matter disease. 2.  No acute intracranial findings.  Original Report Authenticated By: P. Loralie Champagne, M.D.   [facility] July 2012 Study Conclusions     Left ventricle: The cavity size was mildly dilated. Wall thickness     was increased in a pattern of severe LVH. Systolic function was     normal. The estimated ejection fraction was in the range of 55% to     60%.   -  Aortic valve: There was moderate stenosis. Trivial regurgitation.     Valve area: 1.23cm^2(VTI). Valve area: 1.1cm^2 (Vmax).   - Mitral valve: Calcified annulus. Mildly thickened leaflets . Mild     regurgitation.   - Left atrium: The atrium was moderately dilated.   - Atrial septum: No defect or patent foramen ovale was identified.   - Pulmonary arteries: PA peak pressure: 31mm Hg (S).  BJY:NWGNFA Fibrillation rate of 85 bpm  ASSESSMENT AND PLAN: See Dr. Alford Highland note. Signed: Bettey Mare. Lyman Bishop NP [facility] 07/23/2011, 3:23 PM Co-Sign MD  Patient seen with NP, agree with the above note.  This is a 70 yo with chronic atrial fibrillation and ESRD who presented yesterday to the ER with lethargy and dizziness.  He had a fever to 100.8 but BP was ok and he was sent home.  He had last HD yesterday.  His daughter thought he looked lethargic today so she brought him back to the ER.  He denies chest pain, cough, dyspnea, abdominal pain, diarrhea.  He has a low grade fever today around 100 with elevated WBCs.  He denies lightheadedness but SBP has been running in the 80s-90s here.  Cardiac enzymes were checked, I believe, due to low blood pressure and TnI was noted to be elevated to 0.64.   1. Troponin elevation: No chest pain.  Patient has ESRD and mild hypotension in the setting of a  febrile illness.  I suspect the troponin elevation is most likely due to demand ischemia (low BP in setting of ESRD).  We will cycle cardiac enzymes.  He is on coumadin with therapeutic INR when checked yesterday, would repeat today.  Recent echo with normal EF and probably moderate AS.  2.  Atrial fibrillation: Rate reasonably controlled.  Would hold labetalol for now with decreased BP.  Continue coumadin as long as INR today is not supratherapeutic.  3.  Hypotension: Highest suspicion for sepsis given low grade fever.  Uncertain of site of origin: CXR clear, on HD so very minimal urine.  No localizing signs or symptoms on exam.  Could be bacteremia from dialysis fistula site.  Will get blood cultures.  Will give gentle 250 cc saline bolus over 2 hours.  Will need admission by internal medicine and would suggest antibiotic coverage.   Marca Ancona 07/23/2011 3:53 PM

## 2011-07-23 NOTE — Consult Note (Signed)
ANTICOAGULATION CONSULT NOTE - Initial Consult  Pharmacy Consult for: Coumadin Indication: atrial fibrillation  No Known Allergies  Vital Signs: Temp: 99.7 F (37.6 C) (12/16 1815) Temp src: Oral (12/16 1815) BP: 85/55 mmHg (12/16 1815) Pulse Rate: 87  (12/16 1815)  Labs:  Basename 07/23/11 1557 07/23/11 1441 07/23/11 1108 07/22/11 0728  HGB -- -- 9.6* --  HCT -- -- 30.2* --  PLT -- -- 181 --  APTT -- -- -- --  LABPROT -- -- 22.3* 25.7*  INR -- -- 1.92* 2.30*  HEPARINUNFRC -- -- -- --  CREATININE -- -- 5.28* 7.55*  CKTOTAL 250* 235* 205 --  CKMB 4.7* 4.6* 4.6* --  TROPONINI 0.83* 0.78* 0.64* --   The CrCl is unknown because both a height and weight (above a minimum accepted value) are required for this calculation.  Medical History: Past Medical History  Diagnosis Date  . End stage renal disease     on hemodialysis  . Cervical stenosis of spinal canal     s/p C3-C6 laminectomy with Dr. Venetia Maxon and Dr. Jeral Fruit  . Anemia     chronic disease  . Atrial fibrillation     coumadin followed by SNF, longstanding persistent and probably permanent afib  . Aortic stenosis     Echo 02/12/11: moderate aortic stenosis, mean gradient 21,  . Hyperparathyroidism, secondary   . Diabetes mellitus   . Seizure disorder   . Neuropathy     diabetec  . Hypertension   . Hyperkalemia   . BPH (benign prostatic hypertrophy)   . Gout   . History of stroke   . Renal carcinoma     followed at [facility]; s/p gamma knife surgery  . Carotid bruit     Doppler 7/12: Plaque without significant ICA stenosis  . Bradycardia     asymptomatic  . Atrial enlargement, bilateral     Medications:  Prescriptions prior to admission  Medication Sig Dispense Refill  . acetaminophen (TYLENOL) 325 MG tablet Take 650 mg by mouth as needed. Headache pain or fever      . allopurinol (ZYLOPRIM) 100 MG tablet Take 100 mg by mouth at bedtime.       Marland Kitchen b complex-vitamin c-folic acid (NEPHRO-VITE) 0.8 MG TABS Take 0.8 mg  by mouth at bedtime.        . bisacodyl (BISCOLAX) 10 MG suppository Place 10 mg rectally as needed. constipation      . escitalopram (LEXAPRO) 10 MG tablet Take 10 mg by mouth at bedtime.       . levETIRAcetam (KEPPRA) 250 MG tablet Take 250 mg by mouth daily.        Marland Kitchen levETIRAcetam (KEPPRA) 500 MG tablet Take 500 mg by mouth at bedtime.       . nitroGLYCERIN (NITROSTAT) 0.4 MG SL tablet Place 0.4 mg under the tongue every 5 (five) minutes as needed. Chest pain      . polyethylene glycol (MIRALAX / GLYCOLAX) packet Take 17 g by mouth daily. For constipation       . pregabalin (LYRICA) 75 MG capsule Take 75 mg by mouth daily.       Marland Kitchen senna (SENOKOT) 8.6 MG TABS Take 1 tablet by mouth daily.        . sevelamer (RENAGEL) 800 MG tablet Take 2,400 mg by mouth 3 (three) times daily with meals.       . Tamsulosin HCl (FLOMAX) 0.4 MG CAPS Take 0.4 mg by mouth daily.       Marland Kitchen  warfarin (COUMADIN) 5 MG tablet Take 5 mg by mouth daily.         Assessment: 70yom to continue home coumadin for afib. Home dose is 5mg  daily. INR was therapeutic yesterday at 2.3 but is now slightly below goal at 1.93. Will increase tonight's dose.  Goal of Therapy:  INR 2-3   Plan:  1) Coumadin 7.5mg  x 1 2) Daily INR  Fredrik Rigger 07/23/2011,6:55 PM

## 2011-07-23 NOTE — ED Notes (Signed)
Applied a restriction band to pt"s right arm. Pt has a shunt on rt arm. 10:37 am JG

## 2011-07-23 NOTE — ED Notes (Signed)
Pt presents to department for evaluation of hypotension and fever. Pt c/o generalized weakness and lethargy. Was seen in ED for same yesterday. HD patient, receives treatments Tuesday, Thursday and Saturday. Pt had full treatment yesterday. Denies pain upon arrival. He is conscious alert and oriented x4. BP 80/50 on scene per EMS. 90/60 upon arrival to ED.

## 2011-07-23 NOTE — ED Notes (Signed)
Pt resting quietly at the time. Re-positioned in bed. Remains hypotensive on cardiac monitor. Denies pain. Cardiology at bedside to consult. Will continue to monitor closely.

## 2011-07-23 NOTE — ED Notes (Signed)
Pt resting quietly. Remains hypotensive on monitor, physician aware. He is conscious alert and oriented x4. Denies pain. Cardiology to consult. Will continue to monitor closely.

## 2011-07-23 NOTE — ED Notes (Signed)
Spoke with [facility] in the lab re: critical troponin level,  Dr. Ignacia Palma notified re: troponin level

## 2011-07-23 NOTE — ED Notes (Signed)
Floor unable to take report at the time. Nurse to call back. 

## 2011-07-23 NOTE — ED Notes (Signed)
Gave old and new ECG to Dr. Ignacia Palma after I preformed. 10:53 am JG.

## 2011-07-23 NOTE — Progress Notes (Signed)
ANTIBIOTIC CONSULT NOTE - INITIAL   Pharmacy Consult for Vancomycin/Zosyn Indication: rule out sepsis  No Known Allergies  Patient Measurements: Weight: 172 lb 6.4 oz (78.2 kg)   Vital Signs: Temp: 100.7 F (38.2 C) (12/16 2000) Temp src: Oral (12/16 2000) BP: 86/48 mmHg (12/16 2000) Pulse Rate: 79  (12/16 2000) Intake/Output from previous day:   Intake/Output from this shift:    Labs:  Basename 07/23/11 1108 07/22/11 0728  WBC 14.5* --  HGB 9.6* --  PLT 181 --  LABCREA -- --  CREATININE 5.28* 7.55*    Microbiology: No results found for this or any previous visit (from the past 720 hour(s)).  Medical History: Past Medical History  Diagnosis Date  . End stage renal disease     on hemodialysis  . Cervical stenosis of spinal canal     s/p C3-C6 laminectomy with Dr. Venetia Maxon and Dr. Jeral Fruit  . Anemia     chronic disease  . Atrial fibrillation     coumadin followed by SNF, longstanding persistent and probably permanent afib  . Aortic stenosis     Echo 02/12/11: moderate aortic stenosis, mean gradient 21,  . Hyperparathyroidism, secondary   . Diabetes mellitus   . Seizure disorder   . Neuropathy     diabetec  . Hypertension   . Hyperkalemia   . BPH (benign prostatic hypertrophy)   . Gout   . History of stroke   . Renal carcinoma     followed at [facility]; s/p gamma knife surgery  . Carotid bruit     Doppler 7/12: Plaque without significant ICA stenosis  . Bradycardia     asymptomatic  . Atrial enlargement, bilateral     Assessment: ESRD pt admitted for increased lethargy.  R/O sepsis.  Vancomycin 1Gm given in ED.  Pt wt 78kg and goal peak(pre-HD) level  about 30 with trough (post-HD) level 15. Will give additional vancomycin 500mg  x1 and redose with each HD.  Zosyn 2.25Gm given in ED will continue q8 schedule d/t renal fx  Goal of Therapy:  Vancomycin Pre-hd [id number]  Plan:  Additional Vancomycin 500mg  x1 then 750mg  QHD TTS Zosybn 2.25gm q8  Marcelino Scot 07/23/2011,9:12 PM

## 2011-07-24 DIAGNOSIS — I359 Nonrheumatic aortic valve disorder, unspecified: Secondary | ICD-10-CM

## 2011-07-24 DIAGNOSIS — I498 Other specified cardiac arrhythmias: Secondary | ICD-10-CM

## 2011-07-24 LAB — CBC
HCT: 28.1 % — ABNORMAL LOW (ref 39.0–52.0)
MCHC: 32 g/dL (ref 30.0–36.0)
Platelets: 172 10*3/uL (ref 150–400)
RDW: 17.9 % — ABNORMAL HIGH (ref 11.5–15.5)
WBC: 11.8 10*3/uL — ABNORMAL HIGH (ref 4.0–10.5)

## 2011-07-24 LAB — GLUCOSE, CAPILLARY
Glucose-Capillary: 119 mg/dL — ABNORMAL HIGH (ref 70–99)
Glucose-Capillary: 132 mg/dL — ABNORMAL HIGH (ref 70–99)

## 2011-07-24 LAB — CARDIAC PANEL(CRET KIN+CKTOT+MB+TROPI)
CK, MB: 5 ng/mL — ABNORMAL HIGH (ref 0.3–4.0)
CK, MB: 4.7 ng/mL — ABNORMAL HIGH (ref 0.3–4.0)
Relative Index: 1.2 (ref 0.0–2.5)
Relative Index: 1.6 (ref 0.0–2.5)
Troponin I: 0.32 ng/mL (ref <0.30)

## 2011-07-24 LAB — INFLUENZA PANEL BY PCR (TYPE A & B)
H1N1 flu by pcr: NOT DETECTED
Influenza A By PCR: NEGATIVE
Influenza B By PCR: NEGATIVE

## 2011-07-24 LAB — BASIC METABOLIC PANEL
BUN: 44 mg/dL — ABNORMAL HIGH (ref 6–23)
Calcium: 9.5 mg/dL (ref 8.4–10.5)
Chloride: 97 mEq/L (ref 96–112)
Creatinine, Ser: 6.85 mg/dL — ABNORMAL HIGH (ref 0.50–1.35)
GFR calc Af Amer: 8 mL/min — ABNORMAL LOW (ref >90)
GFR calc non Af Amer: 7 mL/min — ABNORMAL LOW (ref >90)

## 2011-07-24 LAB — PROTIME-INR: INR: 1.6 — ABNORMAL HIGH (ref 0.00–1.49)

## 2011-07-24 MED ORDER — HEPARIN SODIUM (PORCINE) 1000 UNIT/ML DIALYSIS
1000.0000 [IU] | INTRAMUSCULAR | Status: DC | PRN
  Filled 2011-07-24: qty 1

## 2011-07-24 MED ORDER — HYDROXYZINE HCL 25 MG PO TABS
25.0000 mg | ORAL_TABLET | Freq: Three times a day (TID) | ORAL | Status: DC | PRN
  Filled 2011-07-24: qty 1

## 2011-07-24 MED ORDER — SODIUM CHLORIDE 0.9 % IV SOLN: 100.0000 mL | INTRAVENOUS | Status: DC | PRN

## 2011-07-24 MED ORDER — PARICALCITOL 5 MCG/ML IV SOLN
4.0000 ug | INTRAVENOUS | Status: DC
  Administered 2011-07-25 – 2011-07-27 (×2): 4 ug via INTRAVENOUS
  Filled 2011-07-24 (×2): qty 0.8

## 2011-07-24 MED ORDER — LIDOCAINE-PRILOCAINE 2.5-2.5 % EX CREA
1.0000 "application " | TOPICAL_CREAM | CUTANEOUS | Status: DC | PRN
  Filled 2011-07-24: qty 5

## 2011-07-24 MED ORDER — LIDOCAINE HCL (PF) 1 % IJ SOLN: 5.0000 mL | INTRAMUSCULAR | Status: DC | PRN

## 2011-07-24 MED ORDER — DOCUSATE SODIUM 283 MG RE ENEM
1.0000 | ENEMA | RECTAL | Status: DC | PRN
  Filled 2011-07-24: qty 1

## 2011-07-24 MED ORDER — DARBEPOETIN ALFA-POLYSORBATE 40 MCG/0.4ML IJ SOLN
40.0000 ug | INTRAMUSCULAR | Status: DC
  Administered 2011-07-25: 40 ug via INTRAVENOUS
  Filled 2011-07-24: qty 0.4

## 2011-07-24 MED ORDER — PENTAFLUOROPROP-TETRAFLUOROETH EX AERO: 1.0000 "application " | INHALATION_SPRAY | CUTANEOUS | Status: DC | PRN

## 2011-07-24 MED ORDER — CALCIUM CARBONATE 1250 MG/5ML PO SUSP
500.0000 mg | Freq: Four times a day (QID) | ORAL | Status: DC | PRN
  Filled 2011-07-24: qty 5

## 2011-07-24 MED ORDER — WARFARIN SODIUM 7.5 MG PO TABS
7.5000 mg | ORAL_TABLET | Freq: Once | ORAL | Status: AC
  Administered 2011-07-24: 7.5 mg via ORAL
  Filled 2011-07-24: qty 1

## 2011-07-24 MED ORDER — ALTEPLASE 2 MG IJ SOLR
2.0000 mg | Freq: Once | INTRAMUSCULAR | Status: AC | PRN
  Filled 2011-07-24: qty 2

## 2011-07-24 MED ORDER — ONDANSETRON HCL 4 MG PO TABS: 4.0000 mg | ORAL_TABLET | Freq: Four times a day (QID) | ORAL | Status: DC | PRN

## 2011-07-24 MED ORDER — ONDANSETRON HCL 4 MG/2ML IJ SOLN: 4.0000 mg | Freq: Four times a day (QID) | INTRAMUSCULAR | Status: DC | PRN

## 2011-07-24 MED ORDER — SEVELAMER CARBONATE 800 MG PO TABS
2400.0000 mg | ORAL_TABLET | Freq: Three times a day (TID) | ORAL | Status: DC
  Administered 2011-07-24 – 2011-07-28 (×12): 2400 mg via ORAL
  Filled 2011-07-24 (×15): qty 3

## 2011-07-24 MED ORDER — NEPRO/CARBSTEADY PO LIQD
237.0000 mL | Freq: Three times a day (TID) | ORAL | Status: DC | PRN
  Filled 2011-07-24: qty 237

## 2011-07-24 MED ORDER — SORBITOL 70 % SOLN
30.0000 mL | Status: DC | PRN
  Filled 2011-07-24: qty 30

## 2011-07-24 MED ORDER — CAMPHOR-MENTHOL 0.5-0.5 % EX LOTN
1.0000 "application " | TOPICAL_LOTION | Freq: Three times a day (TID) | CUTANEOUS | Status: DC | PRN
  Filled 2011-07-24: qty 222

## 2011-07-24 MED ORDER — HEPARIN SODIUM (PORCINE) 1000 UNIT/ML DIALYSIS
2200.0000 [IU] | INTRAMUSCULAR | Status: DC
  Administered 2011-07-25: 2200 [IU] via INTRAVENOUS_CENTRAL
  Filled 2011-07-24 (×2): qty 3

## 2011-07-24 MED ORDER — ZOLPIDEM TARTRATE 5 MG PO TABS: 5.0000 mg | ORAL_TABLET | Freq: Every evening | ORAL | Status: DC | PRN

## 2011-07-24 NOTE — Progress Notes (Signed)
   CARE MANAGEMENT NOTE 07/24/2011  Patient:  [name], [name]   Account Number:  [id number]  Date Initiated:  07/24/2011  Documentation initiated by:  Onnie Boer  Subjective/Objective Assessment:   PT WAS ADMITTED WITH SEPSIS     Action/Plan:   PROGRESSION OF CARE AND DISCHARGE PLANNING   Anticipated DC Date:  07/27/2011   Anticipated DC Plan:  HOME W HOME HEALTH SERVICES      DC Planning Services  CM consult      Choice offered to / List presented to:             Status of service:  In process, will continue to follow Medicare Important Message given?   (If response is "NO", the following Medicare IM given date fields will be blank) Date Medicare IM given:   Date Additional Medicare IM given:    Discharge Disposition:    Per UR Regulation:  Reviewed for med. necessity/level of care/duration of stay  Comments:  UR COMPLETED 07/24/2011 Onnie Boer, RN, BSN 1536 PT WAS ADMITTED WITH SEPSIS.  PTA PT WAS AT HOME WITH A HH RN WITH [facility], WILL F/U ON DC NEEDS.

## 2011-07-24 NOTE — Progress Notes (Signed)
  Echocardiogram 2D Echocardiogram has been performed.  Shelsey Rieth Nira Retort 07/24/2011, 10:29 AM

## 2011-07-24 NOTE — Consult Note (Signed)
[facility] Renal Consultation Note  Indication for Consultation:  Management of ESRD/hemodialysis; anemia, hypertension/volume and secondary hyperparathyroidism  HPI: [name] is a 70 y.o. male.  This is a 70 year old [place] American male with ESRD on chronic HD support every TTS at [facility]. He presented to the ED for the second timer in the past 2 days with lethargy, weakness, low BP and mild leukocytosis and is admitted for presumed Sepsis. Of note, he recently completed doxycline for UTI and was admitted for further evaluation and treatment. PMH also includes atrial fibrillation on Coumadin, aortic stenosis, type 2 diabetes mellitus, OSA, AO, Gout, BPH, CVA hypertension, and seizure disorder. Renal consult ongoing RRT with plans for HD tomorrow.  Dialysis Orders: Center: [facility].)  on TTS. EDW 77kg HD Bath 2K/2.25Ca Time 4 hrs Heparin 2,200 units. Access RFA AVF; #15g (Buttonhole) needles BFR 450 DFR 800  Optiflux 180 dialyzer Zemplar 4 mcg IV/HD Epogen 2,800units   Units IV/HD  Venofer  none Other   Past Medical History  Diagnosis Date  . End stage renal disease     on hemodialysis  . Cervical stenosis of spinal canal     s/p C3-C6 laminectomy with Dr. Venetia Maxon and Dr. Jeral Fruit  . Anemia     chronic disease  . Atrial fibrillation     coumadin followed by SNF, longstanding persistent and probably permanent afib  . Aortic stenosis     Echo 02/12/11: moderate aortic stenosis, mean gradient 21,  . Hyperparathyroidism, secondary   . Diabetes mellitus   . Seizure disorder   . Neuropathy     diabetec  . Hypertension   . Hyperkalemia   . BPH (benign prostatic hypertrophy)   . Gout   . History of stroke   . Renal carcinoma     followed at [facility]; s/p gamma knife surgery  . Carotid bruit     Doppler 7/12: Plaque without significant ICA stenosis  . Bradycardia     asymptomatic  . Atrial enlargement, bilateral    Past Surgical History  Procedure Date    . Foot surgery   . Radiofrequency ablation kidney   . Neck surgery    History reviewed. No pertinent family history.  reports that he has never smoked. He does not have any smokeless tobacco history on file. He reports that he does not drink alcohol or use illicit drugs. No Known Allergies Prior to Admission medications   Medication Sig Start Date End Date Taking? Authorizing Provider  acetaminophen (TYLENOL) 325 MG tablet Take 650 mg by mouth as needed. Headache pain or fever   Yes Historical Provider, MD  allopurinol (ZYLOPRIM) 100 MG tablet Take 100 mg by mouth at bedtime.    Yes Historical Provider, MD  b complex-vitamin c-folic acid (NEPHRO-VITE) 0.8 MG TABS Take 0.8 mg by mouth at bedtime.     Yes Historical Provider, MD  bisacodyl (BISCOLAX) 10 MG suppository Place 10 mg rectally as needed. constipation   Yes Historical Provider, MD  escitalopram (LEXAPRO) 10 MG tablet Take 10 mg by mouth at bedtime.    Yes Historical Provider, MD  levETIRAcetam (KEPPRA) 250 MG tablet Take 250 mg by mouth daily.     Yes Historical Provider, MD  levETIRAcetam (KEPPRA) 500 MG tablet Take 500 mg by mouth at bedtime.    Yes Historical Provider, MD  nitroGLYCERIN (NITROSTAT) 0.4 MG SL tablet Place 0.4 mg under the tongue every 5 (five) minutes as needed. Chest pain   Yes  Historical Provider, MD  polyethylene glycol (MIRALAX / GLYCOLAX) packet Take 17 g by mouth daily. For constipation    Yes Historical Provider, MD  pregabalin (LYRICA) 75 MG capsule Take 75 mg by mouth daily.    Yes Historical Provider, MD  senna (SENOKOT) 8.6 MG TABS Take 1 tablet by mouth daily.     Yes Historical Provider, MD  sevelamer (RENAGEL) 800 MG tablet Take 2,400 mg by mouth 3 (three) times daily with meals.    Yes Historical Provider, MD  Tamsulosin HCl (FLOMAX) 0.4 MG CAPS Take 0.4 mg by mouth daily.    Yes Historical Provider, MD  warfarin (COUMADIN) 5 MG tablet Take 5 mg by mouth daily.     Yes Historical Provider, MD   I  have reviewed the patient's current medications. Results for orders placed during the hospital encounter of 07/23/11 (from the past 48 hour(s))  CBC     Status: Abnormal   Collection Time   07/23/11 11:08 AM      Component Value Range Comment   WBC 14.5 (*) 4.0 - 10.5 (K/uL)    RBC 3.48 (*) 4.22 - 5.81 (MIL/uL)    Hemoglobin 9.6 (*) 13.0 - 17.0 (g/dL)    HCT 30.8 (*) 65.7 - 52.0 (%)    MCV 86.8  78.0 - 100.0 (fL)    MCH 27.6  26.0 - 34.0 (pg)    MCHC 31.8  30.0 - 36.0 (g/dL)    RDW 84.6 (*) 96.2 - 15.5 (%)    Platelets 181  150 - 400 (K/uL)   DIFFERENTIAL     Status: Abnormal   Collection Time   07/23/11 11:08 AM      Component Value Range Comment   Neutrophils Relative 73  43 - 77 (%)    Neutro Abs 10.6 (*) 1.7 - 7.7 (K/uL)    Lymphocytes Relative 10 (*) 12 - 46 (%)    Lymphs Abs 1.4  0.7 - 4.0 (K/uL)    Monocytes Relative 17 (*) 3 - 12 (%)    Monocytes Absolute 2.4 (*) 0.1 - 1.0 (K/uL)    Eosinophils Relative 0  0 - 5 (%)    Eosinophils Absolute 0.0  0.0 - 0.7 (K/uL)    Basophils Relative 0  0 - 1 (%)    Basophils Absolute 0.0  0.0 - 0.1 (K/uL)   COMPREHENSIVE METABOLIC PANEL     Status: Abnormal   Collection Time   07/23/11 11:08 AM      Component Value Range Comment   Sodium 138  135 - 145 (mEq/L)    Potassium 3.9  3.5 - 5.1 (mEq/L) DELTA CHECK NOTED   Chloride 98  96 - 112 (mEq/L)    CO2 30  19 - 32 (mEq/L)    Glucose, Bld 104 (*) 70 - 99 (mg/dL)    BUN 30 (*) 6 - 23 (mg/dL) DELTA CHECK NOTED   Creatinine, Ser 5.28 (*) 0.50 - 1.35 (mg/dL)    Calcium 9.6  8.4 - 10.5 (mg/dL)    Total Protein 7.2  6.0 - 8.3 (g/dL)    Albumin 2.5 (*) 3.5 - 5.2 (g/dL)    AST 20  0 - 37 (U/L)    ALT 15  0 - 53 (U/L)    Alkaline Phosphatase 97  39 - 117 (U/L)    Total Bilirubin 0.7  0.3 - 1.2 (mg/dL)    GFR calc non Af Amer 10 (*) >90 (mL/min)    GFR calc Af Denyse Dago  11 (*) >90 (mL/min)   CARDIAC PANEL(CRET KIN+CKTOT+MB+TROPI)     Status: Abnormal   Collection Time   07/23/11 11:08 AM       Component Value Range Comment   Total CK 205  7 - 232 (U/L)    CK, MB 4.6 (*) 0.3 - 4.0 (ng/mL)    Troponin I 0.64 (*) <0.30 (ng/mL)    Relative Index 2.2  0.0 - 2.5    D-DIMER, QUANTITATIVE     Status: Abnormal   Collection Time   07/23/11 11:08 AM      Component Value Range Comment   D-Dimer, Quant 0.91 (*) 0.00 - 0.48 (ug/mL-FEU)   PROTIME-INR     Status: Abnormal   Collection Time   07/23/11 11:08 AM      Component Value Range Comment   Prothrombin Time 22.3 (*) 11.6 - 15.2 (seconds)    INR 1.92 (*) 0.00 - 1.49    CARDIAC PANEL(CRET KIN+CKTOT+MB+TROPI)     Status: Abnormal   Collection Time   07/23/11  2:41 PM      Component Value Range Comment   Total CK 235 (*) 7 - 232 (U/L)    CK, MB 4.6 (*) 0.3 - 4.0 (ng/mL)    Troponin I 0.78 (*) <0.30 (ng/mL)    Relative Index 2.0  0.0 - 2.5    CULTURE, BLOOD (ROUTINE X 2)     Status: Normal (Preliminary result)   Collection Time   07/23/11  3:45 PM      Component Value Range Comment   Specimen Description BLOOD RIGHT ARM      Special Requests BOTTLES DRAWN AEROBIC AND ANAEROBIC 10CC      Setup Time [id number]      Culture        Value:        BLOOD CULTURE RECEIVED NO GROWTH TO DATE CULTURE WILL BE HELD FOR 5 DAYS BEFORE ISSUING A FINAL NEGATIVE REPORT   Report Status PENDING     LACTIC ACID, PLASMA     Status: Normal   Collection Time   07/23/11  3:56 PM      Component Value Range Comment   Lactic Acid, Venous 1.3  0.5 - 2.2 (mmol/L)   PROCALCITONIN     Status: Normal   Collection Time   07/23/11  3:57 PM      Component Value Range Comment   Procalcitonin 6.85     CARDIAC PANEL(CRET KIN+CKTOT+MB+TROPI)     Status: Abnormal   Collection Time   07/23/11  3:57 PM      Component Value Range Comment   Total CK 250 (*) 7 - 232 (U/L)    CK, MB 4.7 (*) 0.3 - 4.0 (ng/mL)    Troponin I 0.83 (*) <0.30 (ng/mL)    Relative Index 1.9  0.0 - 2.5    CULTURE, BLOOD (ROUTINE X 2)     Status: Normal (Preliminary result)   Collection  Time   07/23/11  4:00 PM      Component Value Range Comment   Specimen Description BLOOD RIGHT ARM      Special Requests BOTTLES DRAWN AEROBIC AND ANAEROBIC 10CC      Setup Time [id number]      Culture        Value:        BLOOD CULTURE RECEIVED NO GROWTH TO DATE CULTURE WILL BE HELD FOR 5 DAYS BEFORE ISSUING A FINAL NEGATIVE REPORT   Report Status PENDING  MRSA PCR SCREENING     Status: Normal   Collection Time   07/23/11  8:45 PM      Component Value Range Comment   MRSA by PCR NEGATIVE  NEGATIVE    GLUCOSE, CAPILLARY     Status: Abnormal   Collection Time   07/23/11  9:46 PM      Component Value Range Comment   Glucose-Capillary 168 (*) 70 - 99 (mg/dL)    Comment 1 Notify RN      Comment 2 Documented in Chart     INFLUENZA PANEL BY PCR     Status: Normal   Collection Time   07/24/11  3:37 AM      Component Value Range Comment   Influenza A By PCR NEGATIVE  NEGATIVE     Influenza B By PCR NEGATIVE  NEGATIVE     H1N1 flu by pcr NOT DETECTED  NOT DETECTED    PROTIME-INR     Status: Abnormal   Collection Time   07/24/11  4:00 AM      Component Value Range Comment   Prothrombin Time 19.3 (*) 11.6 - 15.2 (seconds)    INR 1.60 (*) 0.00 - 1.49    CARDIAC PANEL(CRET KIN+CKTOT+MB+TROPI)     Status: Abnormal   Collection Time   07/24/11  4:00 AM      Component Value Range Comment   Total CK 365 (*) 7 - 232 (U/L)    CK, MB 4.5 (*) 0.3 - 4.0 (ng/mL)    Troponin I 0.42 (*) <0.30 (ng/mL)    Relative Index 1.2  0.0 - 2.5    BASIC METABOLIC PANEL     Status: Abnormal   Collection Time   07/24/11  4:00 AM      Component Value Range Comment   Sodium 135  135 - 145 (mEq/L)    Potassium 4.1  3.5 - 5.1 (mEq/L)    Chloride 97  96 - 112 (mEq/L)    CO2 29  19 - 32 (mEq/L)    Glucose, Bld 84  70 - 99 (mg/dL)    BUN 44 (*) 6 - 23 (mg/dL)    Creatinine, Ser 0.45 (*) 0.50 - 1.35 (mg/dL)    Calcium 9.5  8.4 - 10.5 (mg/dL)    GFR calc non Af Amer 7 (*) >90 (mL/min)    GFR calc Af Amer  8 (*) >90 (mL/min)   CBC     Status: Abnormal   Collection Time   07/24/11  4:00 AM      Component Value Range Comment   WBC 11.8 (*) 4.0 - 10.5 (K/uL)    RBC 3.25 (*) 4.22 - 5.81 (MIL/uL)    Hemoglobin 9.0 (*) 13.0 - 17.0 (g/dL)    HCT 40.9 (*) 81.1 - 52.0 (%)    MCV 86.5  78.0 - 100.0 (fL)    MCH 27.7  26.0 - 34.0 (pg)    MCHC 32.0  30.0 - 36.0 (g/dL)    RDW 91.4 (*) 78.2 - 15.5 (%)    Platelets 172  150 - 400 (K/uL)   GLUCOSE, CAPILLARY     Status: Normal   Collection Time   07/24/11  8:39 AM      Component Value Range Comment   Glucose-Capillary 96  70 - 99 (mg/dL)    Comment 1 Notify RN     CARDIAC PANEL(CRET KIN+CKTOT+MB+TROPI)     Status: Abnormal   Collection Time   07/24/11  8:55 AM  Component Value Range Comment   Total CK 306 (*) 7 - 232 (U/L)    CK, MB 5.0 (*) 0.3 - 4.0 (ng/mL)    Troponin I 0.32 (*) <0.30 (ng/mL)    Relative Index 1.6  0.0 - 2.5     Telemetry: Afib/flutter with controlled rate on monitor (69-72) ROS: AMS, weakness, and fever; otherwise negative (currently @ baseline and denies any problems)   Physical Exam: Filed Vitals:   07/24/11 0800  BP: 100/63  Pulse: 79  Temp: 99.5 F (37.5 C)  Resp: 16     General: comfortable Heart: chronic afib with controlled rate; soft 2-36 SEM Lungs: Diminished bilat; no R/R/W's Abdomen: soft, NT/ND, +BS Extremities: no ankle edema Neuro: alert, oriented, grossly intact Dialysis Access: RFA AVF +T/B  Assessment/Plan: 1. Sepsis/Fever- AMS and hypotension both felt secondary to presumed sepsis on adm; Temp max 100.7; currently 99.5 and on antbx (Vanc and Zosyn per pharmacy) 2. Chronic Afib/Bradycardia/elevated troponin- seen by Cardiology; 2D Echo completed, results pending; on coumadin per pharmacy dosing; most recent INR 1.93 3. Seizure Disorder- no activity on Keppra 4. ESRD - TTS [facility] next HD treatment tomorrow 5. Hypotension/volume  - low BP and volume depleted on adm; improved following  IVF; keep even during HD tomorrow 6. Anemia  - Hgb 9.0; decreased from 10.6 in outpt setting; no iron; increase outpatient epogen dose by giving Aranesp; follow trend 7. Metabolic bone disease -  Same outpt binders (Renagel for now but has been on Phoslo, and Sensipar); continue Zemplar; follow ca/phos 8. Nutrition - albumin 3.4 on 07/06/11; on ONSP in outpatient setting; appetite fair; follow closely 9. DM/HLD/Neuropathy- CBG and SSI; Lyrica with primary services following 10. Chronic pain- s/p recent cervical laminectomy (02/16/11- Dr. Venetia Maxon) 11. Disposition- per primary services  Samuel Germany, FNP-C [facility] Pager [phone] 07/24/2011, 10:55 AM   Attending Nephrologist: Dr. Elvis Coil      I have seen and examined this patient and agree with the plan of care febrile illness possible pneumonia will plan dialysis in morning .  Rayli Wiederhold W 07/24/2011, 2:05 PM

## 2011-07-24 NOTE — Progress Notes (Signed)
Pt's flu PCR negative.  Spoke with Aggie Cosier of Infection Prevention.  Droplet precautions d/c'd per protocol.    Roselie Awkward RN

## 2011-07-24 NOTE — Progress Notes (Signed)
Subjective: I have reviewed the patient's admission history and physical and subsequent consult notes in detail.  At the present time the patient is resting comfortably in his hospital bed. He denies fevers chills nausea vomiting shortness of breath or chest pain. In fact he reports that he feels "better than I have in quite a while".    The patient reports that he does tend to urinate a small volume of urine every day or at least every other day.  In the last week he has occasionally noted that there was blood in his urine.  Objective: Weight change:   Intake/Output Summary (Last 24 hours) at 07/24/11 1538 Last data filed at 07/24/11 1044  Gross per 24 hour  Intake    100 ml  Output      0 ml  Net    100 ml   Blood pressure 98/60, pulse 57, temperature 98.7 F (37.1 C), temperature source Oral, resp. rate 20, height 5\' 10"  (1.778 m), weight 76.5 kg (168 lb 10.4 oz), SpO2 98.00%.  Physical Exam: General: No acute respiratory distress Lungs: Clear to auscultation bilaterally without wheezes or crackles Cardiovascular:  irregularly irregular without gallop or rub with normal S1-S2 with rate  Abdomen: Nontender, nondistended, soft, bowel sounds positive, no rebound, no ascites, no appreciable mass Extremities: No significant cyanosis, clubbing, or edema bilateral lower extremities Neuro: Alert and oriented x4, cranial nerves II through XII intact bilaterally  Lab Results:  Basename 07/24/11 0400 07/23/11 1108 07/22/11 0728  NA 135 138 138  K 4.1 3.9 5.0  CL 97 98 101  CO2 29 30 28   GLUCOSE 84 104* 131*  BUN 44* 30* 52*  CREATININE 6.85* 5.28* 7.55*  CALCIUM 9.5 9.6 9.2  MG -- -- --  PHOS -- -- --    Basename 07/23/11 1108 07/22/11 0728  AST 20 13  ALT 15 9  ALKPHOS 97 86  BILITOT 0.7 0.4  PROT 7.2 6.8  ALBUMIN 2.5* 2.8*    Basename 07/24/11 0400 07/23/11 1108  WBC 11.8* 14.5*  NEUTROABS -- 10.6*  HGB 9.0* 9.6*  HCT 28.1* 30.2*  MCV 86.5 86.8  PLT 172 181     Basename 07/24/11 0855 07/24/11 0400 07/23/11 1557  CKTOTAL 306* 365* 250*  CKMB 5.0* 4.5* 4.7*  CKMBINDEX -- -- --  TROPONINI 0.32* 0.42* 0.83*   No results found for this basename: HGBA1C:12 in the last 72 hours  Micro Results: Recent Results (from the past 240 hour(s))  CULTURE, BLOOD (ROUTINE X 2)     Status: Normal (Preliminary result)   Collection Time   07/23/11  3:45 PM      Component Value Range Status Comment   Specimen Description BLOOD RIGHT ARM   Final    Special Requests BOTTLES DRAWN AEROBIC AND ANAEROBIC 10CC   Final    Setup Time [id number]   Final    Culture     Final    Value:        BLOOD CULTURE RECEIVED NO GROWTH TO DATE CULTURE WILL BE HELD FOR 5 DAYS BEFORE ISSUING A FINAL NEGATIVE REPORT   Report Status PENDING   Incomplete   CULTURE, BLOOD (ROUTINE X 2)     Status: Normal (Preliminary result)   Collection Time   07/23/11  4:00 PM      Component Value Range Status Comment   Specimen Description BLOOD RIGHT ARM   Final    Special Requests BOTTLES DRAWN AEROBIC AND ANAEROBIC 10CC   Final  Setup Time [phone]   Final    Culture     Final    Value:        BLOOD CULTURE RECEIVED NO GROWTH TO DATE CULTURE WILL BE HELD FOR 5 DAYS BEFORE ISSUING A FINAL NEGATIVE REPORT   Report Status PENDING   Incomplete   MRSA PCR SCREENING     Status: Normal   Collection Time   07/23/11  8:45 PM      Component Value Range Status Comment   MRSA by PCR NEGATIVE  NEGATIVE  Final    I have reviewed the patient's active medication list in detail.  Assessment/Plan:  Hypotension Reportedly patient has been suffering with some hypotension related to his dialysis in the last few weeks-it's not clear to me at the present time that the patient is actually suffering with a systemic infection-He is currently asymptomatic with a systolic blood pressure consistently in the 90-I will check a random serum cortisol in the morning-we will consider adding Midodrine  ?  Sepsis There is no evidence of infiltrate on chest x-ray-blood cultures are thus far unrevealing-the white blood cell count is improving-perhaps his fevers were due to a viral syndrome-his influenza screen was negative-if he is able to produce urine while he is here we will send this for analysis  Altered mental status Etiology is unclear at this time-possibly dehydration/hypotension related-check B12 and folic acid levels and follow  Mildly elevated troponin See comments as per cardiology  End stage renal disease On a Tuesday Thursday Saturday hemodialysis schedule-nephrology is following  Atrial fibrillation with episodes of asymptomatic bradycardia On chronic Coumadin therapy-followed by [facility] cardiology-Coumadin is not therapeutic at present-pharmacy to dose  Aortic stenosis Perhaps this is contributing to the patient's hypotension-an echocardiogram is pending   Diabetes mellitus CBGs are well-controlled  Seizure disorder No evidence of acute complication  history of Hypertension Not an active problem  Anemia of end-stage renal disease Hemoglobin appears to be stable   BPH Renal has discontinued medical therapy due to a possible connection to his hypotension   No CODE BLUE/DO NOT RESUSCITATE   LOS: 1 day  07/24/2011, 3:38 PM  Lonia Blood, MD [facility] Office  [phone] Pager [phone]  On-Call/Text Page:      [email or web address]      password [facility]

## 2011-07-24 NOTE — Progress Notes (Signed)
ANTICOAGULATION CONSULT NOTE - Follow Up Consult  Pharmacy Consult for Coumadin Indication: atrial fibrillation  No Known Allergies  Patient Measurements: Height: 5\' 10"  (177.8 cm) Weight: 168 lb 10.4 oz (76.5 kg) IBW/kg (Calculated) : 73  Adjusted Body Weight:   Vital Signs: Temp: 98.6 F (37 C) (12/17 1158) Temp src: Oral (12/17 1158) BP: 106/62 mmHg (12/17 1158) Pulse Rate: 65  (12/17 1158)  Labs:  Basename 07/24/11 0855 07/24/11 0400 07/23/11 1557 07/23/11 1108 07/22/11 0728  HGB -- 9.0* -- 9.6* --  HCT -- 28.1* -- 30.2* --  PLT -- 172 -- 181 --  APTT -- -- -- -- --  LABPROT -- 19.3* -- 22.3* 25.7*  INR -- 1.60* -- 1.92* 2.30*  HEPARINUNFRC -- -- -- -- --  CREATININE -- 6.85* -- 5.28* 7.55*  CKTOTAL 306* 365* 250* -- --  CKMB 5.0* 4.5* 4.7* -- --  TROPONINI 0.32* 0.42* 0.83* -- --   Estimated Creatinine Clearance: 10.2 ml/min (by C-G formula based on Cr of 6.85).   Assessment: 70yom on chronic Coumadin for Afib. INR (1.6) is subtherapeutic. However, 7.5mg  dose scheduled for 12/16 was not given until early this AM (~0030), so INR most likely not fully reflective of given dose.  - H/H and Plts slight decrease - AST/ALT wnl - No significant bleeding reported  Goal of Therapy:  INR 2-3   Plan:  1. Repeat Coumadin 7.5mg  po x1 2. Follow-up AM INR  Cleon Dew [phone] 07/24/2011,2:08 PM

## 2011-07-24 NOTE — Progress Notes (Signed)
SUBJECTIVE:  Pt well known to me due to permanent afib and asymptomatic bradycardia.  He is now admitted with AMS and hypotension, felt to be due to sepsis.    The patient is doing well today.  His mental status is much improved.  At this time, he denies chest pain, shortness of breath, fevers, chills, or any new concerns.     Marland Kitchen allopurinol  100 mg Oral QHS  . insulin aspart  0-9 Units Subcutaneous TID WC  . levETIRAcetam  250 mg Oral Daily  . levETIRAcetam  500 mg Oral QHS  . multivitamin  1 tablet Oral QHS  . piperacillin-tazobactam (ZOSYN)  IV  2.25 g Intravenous To Major  . piperacillin-tazobactam (ZOSYN)  IV  2.25 g Intravenous Q8H  . polyethylene glycol  17 g Oral Daily  . pregabalin  75 mg Oral Daily  . senna  1 tablet Oral Daily  . sevelamer  2,400 mg Oral TID WC  . sodium chloride  250 mL Intravenous Once  . Tamsulosin HCl  0.4 mg Oral QHS  . vancomycin  500 mg Intravenous Once  . vancomycin  750 mg Intravenous Q T,Th,Sa-HD  . vancomycin  1,000 mg Intravenous Once  . warfarin  7.5 mg Oral Once  . DISCONTD: sodium chloride   Intravenous Once  . DISCONTD: warfarin  5 mg Oral Daily      OBJECTIVE: Physical Exam: Filed Vitals:   07/24/11 0000 07/24/11 0023 07/24/11 0339 07/24/11 0400  BP:  87/57 96/56   Pulse: 68 79 72 70  Temp:   97.9 F (36.6 C)   TempSrc:   Oral   Resp: 21 16 15 16   Height:      Weight:   168 lb 10.4 oz (76.5 kg)   SpO2: 100% 98% 98% 99%   No intake or output data in the 24 hours ending 07/24/11 0758  Telemetry reveals afib, V rate 60s  GEN- The patient is well appearing, alert   Head- normocephalic, atraumatic Eyes-  Sclera clear, conjunctiva pink Ears- hearing intact Oropharynx- clear Neck- supple, JVP 9cm Lymph- no cervical lymphadenopathy Lungs- Clear to ausculation bilaterally, normal work of breathing Heart- irregular rate and rhythm, 2/6 SEM LUSB (mid peaking) GI- soft, NT, ND, + BS Extremities- no clubbing, cyanosis, or  edema Skin- no rash or lesion Psych- euthymic mood, full affect Neuro- strength and sensation are intact  LABS: Basic Metabolic Panel:  Basename 07/24/11 0400 07/23/11 1108  NA 135 138  K 4.1 3.9  CL 97 98  CO2 29 30  GLUCOSE 84 104*  BUN 44* 30*  CREATININE 6.85* 5.28*  CALCIUM 9.5 9.6  MG -- --  PHOS -- --   Liver Function Tests:  [facility] 07/23/11 1108 07/22/11 0728  AST 20 13  ALT 15 9  ALKPHOS 97 86  BILITOT 0.7 0.4  PROT 7.2 6.8  ALBUMIN 2.5* 2.8*   No results found for this basename: LIPASE:2,AMYLASE:2 in the last 72 hours CBC:  Basename 07/24/11 0400 07/23/11 1108  WBC 11.8* 14.5*  NEUTROABS -- 10.6*  HGB 9.0* 9.6*  HCT 28.1* 30.2*  MCV 86.5 86.8  PLT 172 181   Cardiac Enzymes:  Basename 07/24/11 0400 07/23/11 1557 07/23/11 1441  CKTOTAL 365* 250* 235*  CKMB 4.5* 4.7* 4.6*  CKMBINDEX -- -- --  TROPONINI 0.42* 0.83* 0.78*   BNP: No components found with this basename: POCBNP:3 D-Dimer:  Basename 07/23/11 1108  DDIMER 0.91*    ASSESSMENT AND PLAN:  Principal Problem:  *  Sepsis Active Problems:  End stage renal disease  Atrial fibrillation  Aortic stenosis  Hypotension  Mr Riolo is admitted with low grade fever, hypotension, and AMS presumed due to sepsis.  He appears stable from a cardiac standpoint.  He had very mild elevation in troponin without significant rise in CKMB and no ischemic symptoms.  1.  Elevated troponin- likely "demand event" in setting of hypotension/ sepsis/ ESRD  Will obtain an echo, no further CV testing planned unless echo is significantly abnormal when compared to prior 2. Permanent afib- stable  Goal INR 2-3 if no further interventions planned 3. Asymptomatic bradycardia- stable,  His heart rate is typically quit low when sleeping or not ambulatory but improves with ambulation.  He is very stable at this time.  No indication for pacemaker implantation.  No AV nodal agents required for afib at this time.  4.  Hypotension- likely due to sepsis-  Per primary team We will obtain an echo to exclude effusion, WMA, worsening AS, or any other possible contributing factors Would run even to slightly positive with dialysis (dry weight may need to be adjusted).  If echo is stable, no further CV intervention planned.  Would then follow as needed.    Hillis Range, MD 07/24/2011 7:58 AM

## 2011-07-24 NOTE — Progress Notes (Signed)
I have stopped the flomax due to hypotension

## 2011-07-25 ENCOUNTER — Inpatient Hospital Stay (HOSPITAL_COMMUNITY): Payer: Medicare Other

## 2011-07-25 LAB — CBC
Hemoglobin: 9 g/dL — ABNORMAL LOW (ref 13.0–17.0)
MCHC: 31.4 g/dL (ref 30.0–36.0)

## 2011-07-25 LAB — RENAL FUNCTION PANEL
CO2: 28 mEq/L (ref 19–32)
Chloride: 93 mEq/L — ABNORMAL LOW (ref 96–112)
GFR calc Af Amer: 6 mL/min — ABNORMAL LOW (ref >90)
GFR calc non Af Amer: 5 mL/min — ABNORMAL LOW (ref >90)
Potassium: 4.4 mEq/L (ref 3.5–5.1)
Sodium: 133 mEq/L — ABNORMAL LOW (ref 135–145)

## 2011-07-25 LAB — GLUCOSE, CAPILLARY
Glucose-Capillary: 96 mg/dL (ref 70–99)
Glucose-Capillary: 171 mg/dL — ABNORMAL HIGH (ref 70–99)

## 2011-07-25 LAB — LEVETIRACETAM LEVEL: Levetiracetam Lvl: 27.1 ug/mL (ref 5.0–30.0)

## 2011-07-25 MED ORDER — NEPRO/CARBSTEADY PO LIQD
237.0000 mL | Freq: Two times a day (BID) | ORAL | Status: DC
Start: 2011-07-25 — End: 2011-07-28
  Administered 2011-07-25 – 2011-07-28 (×6): 237 mL via ORAL
  Filled 2011-07-25 (×9): qty 237

## 2011-07-25 MED ORDER — DARBEPOETIN ALFA-POLYSORBATE 40 MCG/0.4ML IJ SOLN
INTRAMUSCULAR | Status: AC
  Administered 2011-07-25: 40 ug via INTRAVENOUS
  Filled 2011-07-25: qty 0.4

## 2011-07-25 MED ORDER — PARICALCITOL 5 MCG/ML IV SOLN
INTRAVENOUS | Status: AC
  Administered 2011-07-25: 4 ug via INTRAVENOUS
  Filled 2011-07-25: qty 1

## 2011-07-25 MED ORDER — WARFARIN SODIUM 6 MG PO TABS
6.0000 mg | ORAL_TABLET | Freq: Once | ORAL | Status: AC
  Administered 2011-07-25: 6 mg via ORAL
  Filled 2011-07-25: qty 1

## 2011-07-25 NOTE — Progress Notes (Signed)
Pt had 18 beat run V tach with spontaneous return to previous rhythm (a fib) rate 70s.  BP 98/52, pt without complaint.  Dr. Butler Denmark and Ward Givens (cardiology) aware.  Will continue to monitor.  Roselie Awkward, RN

## 2011-07-25 NOTE — Progress Notes (Signed)
ANTICOAGULATION CONSULT NOTE - Follow Up Consult  Pharmacy Consult for Coumadin Indication: atrial fibrillation  No Known Allergies  Patient Measurements: Height: 5\' 10"  (177.8 cm) Weight: 174 lb 13.2 oz (79.3 kg) IBW/kg (Calculated) : 73  Adjusted Body Weight:   Vital Signs: Temp: 98.6 F (37 C) (12/18 1057) Temp src: Oral (12/18 1057) BP: 109/64 mmHg (12/18 1057) Pulse Rate: 72  (12/18 1057)  Labs:  Basename 07/25/11 0405 07/24/11 1642 07/24/11 0855 07/24/11 0400 07/23/11 1108  HGB 9.0* -- -- 9.0* --  HCT 28.7* -- -- 28.1* 30.2*  PLT 194 -- -- 172 181  APTT -- -- -- -- --  LABPROT 21.1* -- -- 19.3* 22.3*  INR 1.79* -- -- 1.60* 1.92*  HEPARINUNFRC -- -- -- -- --  CREATININE 8.56* -- -- 6.85* 5.28*  CKTOTAL -- 192 306* 365* --  CKMB -- 4.7* 5.0* 4.5* --  TROPONINI -- 0.40* 0.32* 0.42* --   Estimated Creatinine Clearance: 8.2 ml/min (by C-G formula based on Cr of 8.56).   Assessment: 70yom on chronic Coumadin for Afib. INR (1.79) is subtherapeutic but trended up with 7.5mg  dose x 2. Will reduce dose near PTA regimen (5mg  daily) and plan to restart PTA regimen tomorrow if INR appropriate.  - H/H and Plts stable - No bleeding complications reported  Goal of Therapy:  INR 2-3   Plan:  1. Coumadin 6mg  po x1 today 2. Follow-up AM INR  Cleon Dew [phone] 07/25/2011,12:05 PM

## 2011-07-25 NOTE — Progress Notes (Signed)
Subjective: I have reviewed the patient's admission history and physical and subsequent consult notes in detail.   He has urinated once since being admitted. No complaints of sore throat, runny nose, cough or diarrhea. He has had dialysis today and is feeling stronger. I have helped him sit up in bed with legs hanging off the bed. He does not complain of dizziness or weakness. He has subsequently gotten up to the bedside commode for a BM and continues to feel stable.   Objective: Weight change: 1.6 kg (3 lb 8.4 oz)  Intake/Output Summary (Last 24 hours) at 07/25/11 1356 Last data filed at 07/25/11 1057  Gross per 24 hour  Intake    700 ml  Output      1 ml  Net    699 ml   Blood pressure 116/69, pulse 68, temperature 98.5 F (36.9 C), temperature source Oral, resp. rate 18, height 5\' 10"  (1.778 m), weight 79.3 kg (174 lb 13.2 oz), SpO2 93.00%.  Physical Exam: General: No acute respiratory distress Lungs: Clear to auscultation bilaterally without wheezes or crackles Cardiovascular:  irregularly irregular without gallop or rub with normal S1-S2 with rate  Abdomen: Nontender, nondistended, soft, bowel sounds positive, no rebound, no ascites, no appreciable mass Extremities: No significant cyanosis, clubbing, or edema bilateral lower extremities Neuro: Alert and oriented x4, cranial nerves II through XII intact bilaterally  Lab Results:  Basename 07/25/11 0405 07/24/11 0400 07/23/11 1108  NA 133* 135 138  K 4.4 4.1 3.9  CL 93* 97 98  CO2 28 29 30   GLUCOSE 130* 84 104*  BUN 63* 44* 30*  CREATININE 8.56* 6.85* 5.28*  CALCIUM 9.4 9.5 9.6  MG -- -- --  PHOS 2.9 -- --    Basename 07/25/11 0405 07/23/11 1108  AST -- 20  ALT -- 15  ALKPHOS -- 97  BILITOT -- 0.7  PROT -- 7.2  ALBUMIN 2.2* 2.5*    Basename 07/25/11 0405 07/24/11 0400 07/23/11 1108  WBC 7.7 11.8* 14.5*  NEUTROABS -- -- 10.6*  HGB 9.0* 9.0* 9.6*  HCT 28.7* 28.1* 30.2*  MCV 85.9 86.5 86.8  PLT 194 172 181     Basename 07/24/11 1642 07/24/11 0855 07/24/11 0400  CKTOTAL 192 306* 365*  CKMB 4.7* 5.0* 4.5*  CKMBINDEX -- -- --  TROPONINI 0.40* 0.32* 0.42*   No results found for this basename: HGBA1C:12 in the last 72 hours  Micro Results: Recent Results (from the past 240 hour(s))  CULTURE, BLOOD (ROUTINE X 2)     Status: Normal (Preliminary result)   Collection Time   07/23/11  3:45 PM      Component Value Range Status Comment   Specimen Description BLOOD RIGHT ARM   Final    Special Requests BOTTLES DRAWN AEROBIC AND ANAEROBIC 10CC   Final    Setup Time [id number]   Final    Culture     Final    Value:        BLOOD CULTURE RECEIVED NO GROWTH TO DATE CULTURE WILL BE HELD FOR 5 DAYS BEFORE ISSUING A FINAL NEGATIVE REPORT   Report Status PENDING   Incomplete   CULTURE, BLOOD (ROUTINE X 2)     Status: Normal (Preliminary result)   Collection Time   07/23/11  4:00 PM      Component Value Range Status Comment   Specimen Description BLOOD RIGHT ARM   Final    Special Requests BOTTLES DRAWN AEROBIC AND ANAEROBIC 10CC   Final  Setup Time [phone]   Final    Culture     Final    Value:        BLOOD CULTURE RECEIVED NO GROWTH TO DATE CULTURE WILL BE HELD FOR 5 DAYS BEFORE ISSUING A FINAL NEGATIVE REPORT   Report Status PENDING   Incomplete   MRSA PCR SCREENING     Status: Normal   Collection Time   07/23/11  8:45 PM      Component Value Range Status Comment   MRSA by PCR NEGATIVE  NEGATIVE  Final    I have reviewed the patient's active medication list in detail.  Assessment/Plan:  Hypotension Reportedly patient has been suffering with some hypotension related to his dialysis in the last few weeks- - a random cortisol is pending.  -He continues to feels well.  -BP is consistently above 100 systolic despite dialysis today therefore, I will be transferring him to the floor.   ? Sepsis There is no evidence of infiltrate on chest x-ray- -perhaps his fevers were due to a viral  syndrome-his influenza screen was negative -if he is able to produce urine while he is here we will send this for analysis - blood cultures are negative so far.  - wbc counts have normalized.  - Will stop Vanc today.   Altered mental status- resolved Etiology is unclear at this time -possibly dehydration/hypotension and resultant poor perfusion of brain - Patient is noted to have mod-severe AS and and most likely is more sensitive to low BPs.  -check B12 and folic acid levels and follow  Mildly elevated troponin See comments as per cardiology  End stage renal disease On a Tuesday Thursday Saturday hemodialysis schedule-nephrology is following  Atrial fibrillation with episodes of asymptomatic bradycardia On chronic Coumadin therapy-followed by [facility]ology-Coumadin is not therapeutic at present-pharmacy to dose  Aortic stenosis Mod-severe per echo.   Diabetes mellitus CBGs are well-controlled  Seizure disorder No evidence of acute complication  history of Hypertension Not an active problem  Anemia of end-stage renal disease Hemoglobin appears to be stable   BPH Renal has discontinued medical therapy due to a possible connection to his hypotension   No CODE BLUE/DO NOT RESUSCITATE   LOS: 2 days  07/25/2011, 1:56 PM  Calvert Cantor MD [phone] On-Call/Text Page:      [email or web address]      password [facility]

## 2011-07-25 NOTE — Progress Notes (Signed)
INITIAL ADULT NUTRITION ASSESSMENT Date: 07/25/2011   Time: 10:52 AM Reason for Assessment: RN Consult re: low albumin  ASSESSMENT: Male 70 y.o.  Dx: Sepsis  Hx:  Past Medical History  Diagnosis Date  . End stage renal disease     on hemodialysis  . Cervical stenosis of spinal canal     s/p C3-C6 laminectomy with Dr. Venetia Maxon and Dr. Jeral Fruit  . Anemia     chronic disease  . Atrial fibrillation     coumadin followed by SNF, longstanding persistent and probably permanent afib  . Aortic stenosis     Echo 02/12/11: moderate aortic stenosis, mean gradient 21,  . Hyperparathyroidism, secondary   . Diabetes mellitus   . Seizure disorder   . Neuropathy     diabetec  . Hypertension   . Hyperkalemia   . BPH (benign prostatic hypertrophy)   . Gout   . History of stroke   . Renal carcinoma     followed at [facility]; s/p gamma knife surgery  . Carotid bruit     Doppler 7/12: Plaque without significant ICA stenosis  . Bradycardia     asymptomatic  . Atrial enlargement, bilateral     Related Meds:  Scheduled Meds:   . allopurinol  100 mg Oral QHS  . darbepoetin (ARANESP) injection - DIALYSIS  40 mcg Intravenous Q Tue-HD  . heparin  2,200 Units Dialysis Q T,Th,Sa-HD  . insulin aspart  0-9 Units Subcutaneous TID WC  . levETIRAcetam  250 mg Oral Daily  . levETIRAcetam  500 mg Oral QHS  . multivitamin  1 tablet Oral QHS  . paricalcitol  4 mcg Intravenous Q T,Th,Sa-HD  . piperacillin-tazobactam (ZOSYN)  IV  2.25 g Intravenous Q8H  . polyethylene glycol  17 g Oral Daily  . pregabalin  75 mg Oral Daily  . senna  1 tablet Oral Daily  . sevelamer  2,400 mg Oral TID WC  . vancomycin  750 mg Intravenous Q T,Th,Sa-HD  . warfarin  7.5 mg Oral ONCE-1800  . DISCONTD: sevelamer  2,400 mg Oral TID WC  . DISCONTD: Tamsulosin HCl  0.4 mg Oral QHS   Continuous Infusions:  PRN Meds:.acetaminophen, acetaminophen, albuterol, alteplase, bisacodyl, calcium carbonate (dosed in mg elemental calcium),  camphor-menthol, docusate sodium, feeding supplement (NEPRO CARB STEADY), heparin, hydrOXYzine, lidocaine, lidocaine-prilocaine, ondansetron (ZOFRAN) IV, ondansetron (ZOFRAN) IV, ondansetron, ondansetron, pentafluoroprop-tetrafluoroeth, sorbitol, zolpidem, DISCONTD: sodium chloride, DISCONTD: sodium chloride   Ht: 5\' 10"  (177.8 cm)  Wt: 175 lb 7.8 oz (79.6 kg)  Ideal Wt: 75.5 kg % Ideal Wt: 105%  Current Dry Weight:  161 lb Dry Weight 6 months ago:  173 lb Weight change:  7% weight loss in 6 months  Body mass index is 25.18 kg/(m^2).  BMI using dry weight is 23.1.  Food/Nutrition Related Hx: Patient reports good oral intake.  Some weight loss since back surgery in July.  Has drank Nepro in the past and is willing to drink 2 cans daily now.  Labs:  CMP     Component Value Date/Time   NA 133* 07/25/2011 0405   K 4.4 07/25/2011 0405   CL 93* 07/25/2011 0405   CO2 28 07/25/2011 0405   GLUCOSE 130* 07/25/2011 0405   BUN 63* 07/25/2011 0405   CREATININE 8.56* 07/25/2011 0405   CALCIUM 9.4 07/25/2011 0405   PROT 7.2 07/23/2011 1108   ALBUMIN 2.2* 07/25/2011 0405   AST 20 07/23/2011 1108   ALT 15 07/23/2011 1108   ALKPHOS 97 07/23/2011 1108  BILITOT 0.7 07/23/2011 1108   GFRNONAA 5* 07/25/2011 0405   GFRAA 6* 07/25/2011 0405   CBG (last 3)   Basename 07/25/11 1236 07/24/11 1748 07/24/11 1207  GLUCAP 96 132* 119*     Intake/Output Summary (Last 24 hours) at 07/25/11 1101 Last data filed at 07/25/11 0400  Gross per 24 hour  Intake    700 ml  Output      1 ml  Net    699 ml    Diet Order: Carbohydrate Modified Medium, Renal 70-2-2 Supplements:  Nepro PO TID prn  IVF:  none  Estimated Nutritional Needs:   Kcal: 2200-2400 Protein: 95-111gm Fluid: 1200 ml  Low albumin likely related to acute stress response and fluid overload.  Current weight is 14 lb over dry weight.  Discussed with patient the need for adequate calorie and protein intake to prevent further  weight loss.  Patient agreeable to Nepro supplements BID.  Patient is at nutrition risk due to recent 7% weight loss over the past 6 months.  NUTRITION DIAGNOSIS: -Increased nutrient needs (NI-5.1).  Status: Ongoing  RELATED TO: renal failure requiring HD  AS EVIDENCED BY: estimated needs 30-33 kcals/kg of current dry weight  MONITORING/EVALUATION(Goals):   PO intake of meals and supplements >75% to meet minimum estimated needs for lean body mass maintenance.  Monitor:  PO intake, labs, weight trend.  EDUCATION NEEDS: -Education needs addressed  INTERVENTION:  Nepro BID to maximize oral intake.  Dietitian #:  [phone]  DOCUMENTATION CODES Per approved criteria  -Not Applicable    Hettie Holstein 07/25/2011, 10:52 AM

## 2011-07-25 NOTE — Progress Notes (Signed)
07-25-11 0600 Pt is stable. Pt is taken to dialysis. Report is given. Pt taken on monitor. Pt is alert and oriented. Hemo nurse informed of: chronic AFIB and hypotension.   Elisha Headland RN

## 2011-07-25 NOTE — Plan of Care (Signed)
Out Patient  Dialysis Plan of care reviewed.  Pt dialyzes at [facility] on Tuesday -Thursday -Saturday.

## 2011-07-25 NOTE — Progress Notes (Signed)
Subjective: Interval History: has no complaint of cough or dyspnea, rash ulcers or diarrhea  Objective: Vital signs in last 24 hours:  Temp:  [98.6 F (37 C)-100.7 F (38.2 C)] 100.7 F (38.2 C) (12/18 0454) Pulse Rate:  [57-79] 69  (12/18 0730) Resp:  [16-20] 17  (12/18 0730) BP: (97-106)/(54-66) 98/65 mmHg (12/18 0730) SpO2:  [98 %-100 %] 100 % (12/18 0400) Weight:  [79.6 kg (175 lb 7.8 oz)-79.8 kg (175 lb 14.8 oz)] 175 lb 7.8 oz (79.6 kg) (12/18 0635)  Weight change: 1.6 kg (3 lb 8.4 oz)  Intake/Output: I/O last 3 completed shifts: In: 1280 [P.O.:1080; IV Piggyback:200] Out: 1 [Stool:1]   Intake/Output this shift:    Awake and alert CVS- RRR loud ejection systolic murmur RS- CTA no rales ABD- BS present soft non-distended EXT- no edema AVF thrill on right  Lab Results:  Basename 07/25/11 0405 07/24/11 0400 07/23/11 1108  WBC 7.7 11.8* 14.5*  HGB 9.0* 9.0* 9.6*  HCT 28.7* 28.1* 30.2*  PLT 194 172 181   BMET  Basename 07/25/11 0405 07/24/11 0400 07/23/11 1108  NA 133* 135 138  K 4.4 4.1 3.9  CL 93* 97 98  CO2 28 29 30   GLUCOSE 130* 84 104*  BUN 63* 44* 30*  CREATININE 8.56* 6.85* 5.28*  CALCIUM 9.4 9.5 9.6  PHOS 2.9 -- --   LFT  Basename 07/25/11 0405 07/23/11 1108  PROT -- 7.2  ALBUMIN 2.2* --  AST -- 20  ALT -- 15  ALKPHOS -- 97  BILITOT -- 0.7  BILIDIR -- --  IBILI -- --   PT/INR  Basename 07/25/11 0405 07/24/11 0400  LABPROT 21.1* 19.3*  INR 1.79* 1.60*   Hepatitis Panel No results found for this basename: HEPBSAG,HCVAB,HEPAIGM,HEPBIGM in the last 72 hours  Studies/Results: Dg Chest 2 View  07/23/2011  *RADIOLOGY REPORT*  Clinical Data: Low grade fever, hypertension, end stage renal disease  CHEST - 2 VIEW  Comparison: 07/22/2011; 02/09/2011  Findings: Unchanged cardiac silhouette and mediastinal contours. No focal parenchymal opacities with apparent opacity overlying the right lower lung favored to be artifactual secondary to a  confluence of overlying osseous and soft tissue structures.  No pleural effusion or pneumothorax.  Grossly unchanged bones.  IMPRESSION: No acute cardiopulmonary disease.  Specifically, no evidence of pneumonia.  Original Report Authenticated By: Waynard Reeds, M.D.    I have reviewed the patient's current medications.  Assessment/Plan: 1. Sepsis/Fever- still febrile with culture negative  2. Chronic Afib/Bradycardia/elevated troponin- seen by Cardiology; 2D Echo aortic stenosis moderate and severe 3. Seizure Disorder- no activity on Keppra  4. ESRD - TTS [facility]  5. Hypotension/volume - low BP stable stopped flomax 6. Anemia - Hgb 9.0 stable  7. Metabolic bone disease - stablle 8. Nutrition - poor albumin 9. DM/HLD/Neuropathy- CBG and SSI; Lyrica with primary services following  10. Chronic pain- s/p recent cervical laminectomy (02/16/11- Dr. Venetia Maxon)  11. Disposition- per primary services    Patient looks good on dialysis this morning  LOS: 2 Aidynn Krenn W @TODAY @7 :53 AM

## 2011-07-25 NOTE — Progress Notes (Signed)
Called by nurse to be notified that the patient had an 18 beat run of v-tach. Cardiology PA, Marvis Repress notified as well. I will change his transfer from med/surg to a telemetry bed. Will check Mg+ as well.

## 2011-07-26 LAB — GLUCOSE, CAPILLARY
Glucose-Capillary: 95 mg/dL (ref 70–99)
Glucose-Capillary: 155 mg/dL — ABNORMAL HIGH (ref 70–99)
Glucose-Capillary: 131 mg/dL — ABNORMAL HIGH (ref 70–99)

## 2011-07-26 LAB — CORTISOL-AM, BLOOD: Cortisol - AM: 8.7 ug/dL (ref 4.3–22.4)

## 2011-07-26 LAB — PROTIME-INR: Prothrombin Time: 20.6 seconds — ABNORMAL HIGH (ref 11.6–15.2)

## 2011-07-26 MED ORDER — WARFARIN SODIUM 7.5 MG PO TABS
7.5000 mg | ORAL_TABLET | Freq: Once | ORAL | Status: AC
  Administered 2011-07-26: 7.5 mg via ORAL
  Filled 2011-07-26: qty 1

## 2011-07-26 MED ORDER — HEPARIN SODIUM (PORCINE) 1000 UNIT/ML DIALYSIS
40.0000 [IU]/kg | INTRAMUSCULAR | Status: DC | PRN
  Administered 2011-07-27: 3300 [IU] via INTRAVENOUS_CENTRAL
  Filled 2011-07-26: qty 4

## 2011-07-26 NOTE — Progress Notes (Signed)
Physical Therapy Evaluation Patient Details Name: [name] MRN: [id number] DOB: 1940-10-15 Today's Date: 07/26/2011  Problem List:  Patient Active Problem List  Diagnoses  . End stage renal disease  . Cervical stenosis of spinal canal  . Atrial fibrillation  . Aortic stenosis  . Diabetes mellitus  . Renal carcinoma  . Carotid bruit  . Hypertension  . Bradycardia  . Hypotension  . Sepsis    Past Medical History:  Past Medical History  Diagnosis Date  . End stage renal disease     on hemodialysis  . Cervical stenosis of spinal canal     s/p C3-C6 laminectomy with Dr. Venetia Maxon and Dr. Jeral Fruit  . Anemia     chronic disease  . Atrial fibrillation     coumadin followed by SNF, longstanding persistent and probably permanent afib  . Aortic stenosis     Echo 02/12/11: moderate aortic stenosis, mean gradient 21,  . Hyperparathyroidism, secondary   . Diabetes mellitus   . Seizure disorder   . Neuropathy     diabetec  . Hypertension   . Hyperkalemia   . BPH (benign prostatic hypertrophy)   . Gout   . History of stroke   . Renal carcinoma     followed at [facility]; s/p gamma knife surgery  . Carotid bruit     Doppler 7/12: Plaque without significant ICA stenosis  . Bradycardia     asymptomatic  . Atrial enlargement, bilateral    Past Surgical History:  Past Surgical History  Procedure Date  . Foot surgery   . Radiofrequency ablation kidney   . Neck surgery     PT Assessment/Plan/Recommendation PT Assessment Clinical Impression Statement: Pt is a 70 y/o male admitted with weakness and the below PT problem list.  Pt would benefit from acute PT to maximize independence and facilitate d/c to next venue. PT Recommendation/Assessment: Patient will need skilled PT in the acute care venue PT Problem List: Decreased strength;Decreased activity tolerance;Decreased balance;Decreased mobility Barriers to Discharge: None PT Therapy Diagnosis : Difficulty walking;Generalized  weakness PT Plan PT Frequency: Min 2X/week PT Treatment/Interventions: DME instruction;Gait training;Functional mobility training;Therapeutic activities;Balance training;Therapeutic exercise;Patient/family education PT Recommendation Follow Up Recommendations: Skilled nursing facility Equipment Recommended: Defer to next venue PT Goals  Acute Rehab PT Goals PT Goal Formulation: With patient/family Time For Goal Achievement: 2 weeks Pt will go Supine/Side to Sit: with supervision PT Goal: Supine/Side to Sit - Progress: Not met Pt will Sit at [facility] of Bed: with supervision;6-10 min;with bilateral upper extremity support PT Goal: Sit at Edge Of Bed - Progress: Not met Pt will go Sit to Supine/Side: with supervision PT Goal: Sit to Supine/Side - Progress: Not met Pt will go Sit to Stand: with min assist PT Goal: Sit to Stand - Progress: Not met Pt will go Stand to Sit: with min assist PT Goal: Stand to Sit - Progress: Not met Pt will Transfer Bed to Chair/Chair to Bed: with min assist PT Transfer Goal: Bed to Chair/Chair to Bed - Progress: Not met Pt will Ambulate: 16 - 50 feet;with min assist;with least restrictive assistive device PT Goal: Ambulate - Progress: Not met  PT Evaluation Precautions/Restrictions  Precautions Precautions: Fall Required Braces or Orthoses: No Restrictions Weight Bearing Restrictions: No Prior Functioning  Home Living Lives With: Daughter (Son-in-law and grandkids.) Receives Help From: Family Type of Home: House Home Layout: One level Home Access: Ramped entrance Home Adaptive Equipment: Environmental consultant - four wheeled;Wheelchair - manual Prior Function Level of Independence: Independent  with transfers;Requires assistive device for independence;Needs assistance with homemaking;Independent with basic ADLs Able to Take Stairs?: No Driving: No Cognition Cognition Arousal/Alertness: Awake/alert Overall Cognitive Status: Appears within functional limits for  tasks assessed Sensation/Coordination Sensation Light Touch: Impaired by gross assessment (Neuropathy bilateral LEs (knees to feet).) Stereognosis: Not tested Hot/Cold: Not tested Proprioception: Not tested Coordination Gross Motor Movements are Fluid and Coordinated: Yes Fine Motor Movements are Fluid and Coordinated: Not tested Extremity Assessment RUE Assessment RUE Assessment: Not tested LUE Assessment LUE Assessment: Not tested RLE Assessment RLE Assessment: Within Functional Limits LLE Assessment LLE Assessment: Exceptions to [facility] LLE Strength LLE Overall Strength: Deficits LLE Overall Strength Comments: 2/5 Pain 0/10 with treatment. Mobility (including Balance) Bed Mobility Bed Mobility: Yes Supine to Sit: 3: Mod assist;With rails;HOB elevated (Comment degrees) (HOB 30 degrees.) Supine to Sit Details (indicate cue type and reason): Assist to trunk to shift anterior and to left LE secondary to weakness.  Cues for sequence. Sitting - Scoot to Edge of Bed: 3: Mod assist Sitting - Scoot to Edge of Bed Details (indicate cue type and reason): Assist to reciprocally scoot bilateral hips with cues for sequence. Transfers Transfers: Yes Sit to Stand: 3: Mod assist;With upper extremity assist;From elevated surface;From bed (4 trials.) Sit to Stand Details (indicate cue type and reason): Assist at sacrum to shift weight anterior over BOS with blocking to left knee to prevent buckling.  Cues for sequence and hand placement. Stand to Sit: 3: Mod assist;To bed;To chair/3-in-1;With upper extremity assist (4 trials.) Stand to Sit Details: Assist at sacrum to control eccentric descent to surface with blocking to left knee preventing buckling.  Cues for sequence. Stand Pivot Transfers: 2: Max Actuary Details (indicate cue type and reason): Assist to facilitate rotation of trunk from bed to chair with cues for hand placement and sequence.  Blocked left knee to prevent  buckling. Ambulation/Gait Ambulation/Gait: No Stairs: No Wheelchair Mobility Wheelchair Mobility: No  Posture/Postural Control Posture/Postural Control: No significant limitations Balance Balance Assessed: Yes Static Sitting Balance Static Sitting - Balance Support: Bilateral upper extremity supported;Feet supported Static Sitting - Level of Assistance: 4: Min assist Static Sitting - Comment/# of Minutes: 10 End of Session PT - End of Session Equipment Utilized During Treatment: Gait belt Activity Tolerance: Patient tolerated treatment well Patient left: in chair;with call bell in reach;with family/visitor present Nurse Communication: Mobility status for transfers (Use of Stedy for back to bed.) General Behavior During Session: [facility] for tasks performed Cognition: [facility] for tasks performed  Cephus Shelling 07/26/2011, 3:03 PM  07/26/2011 Cephus Shelling, PT, DPT [phone]

## 2011-07-26 NOTE — Progress Notes (Signed)
Subjective: Patient seen and examined this am. informs feeling better today. Had tmax of 100.5 yesterday. Also had 18 beat run of vtach on tele yesterday. Stable overnight on tele.  Objective:  Vital signs in last 24 hours:  Filed Vitals:   07/25/11 2303 07/26/11 0332 07/26/11 0800 07/26/11 1200  BP: 98/68 104/65 113/62 122/86  Pulse: 67 68 68 78  Temp: 100.5 F (38.1 C) 99 F (37.2 C) 98.9 F (37.2 C) 98.7 F (37.1 C)  TempSrc: Oral Oral Oral Oral  Resp: 16 23 20 15   Height:      Weight:  81.8 kg (180 lb 5.4 oz)    SpO2: 100% 95% 96% 99%    Intake/Output from previous day:   Intake/Output Summary (Last 24 hours) at 07/26/11 1502 Last data filed at 07/26/11 1000  Gross per 24 hour  Intake   1230 ml  Output      4 ml  Net   1226 ml    Physical Exam:  General: elderly male  in no acute distress. HEENT: no pallor, no icterus, moist oral mucosa, no JVD, no lymphadenopathy Heart: Normal  s1 &s2  Regular rate and rhythm, without murmurs, rubs, gallops. Lungs: Clear to auscultation bilaterally. Abdomen: Soft, nontender, nondistended, positive bowel sounds. Extremities: No clubbing cyanosis or edema with positive pedal pulses. Neuro: Alert, awake, oriented x3, nonfocal.   Lab Results:  Basic Metabolic Panel:    Component Value Date/Time   NA 133* 07/25/2011 0405   K 4.4 07/25/2011 0405   CL 93* 07/25/2011 0405   CO2 28 07/25/2011 0405   BUN 63* 07/25/2011 0405   CREATININE 8.56* 07/25/2011 0405   GLUCOSE 130* 07/25/2011 0405   CALCIUM 9.4 07/25/2011 0405   CBC:    Component Value Date/Time   WBC 7.7 07/25/2011 0405   HGB 9.0* 07/25/2011 0405   HCT 28.7* 07/25/2011 0405   PLT 194 07/25/2011 0405   MCV 85.9 07/25/2011 0405   NEUTROABS 10.6* 07/23/2011 1108   LYMPHSABS 1.4 07/23/2011 1108   MONOABS 2.4* 07/23/2011 1108   EOSABS 0.0 07/23/2011 1108   BASOSABS 0.0 07/23/2011 1108    Recent Results (from the past 240 hour(s))  CULTURE, BLOOD (ROUTINE X 2)      Status: Normal (Preliminary result)   Collection Time   07/23/11  3:45 PM      Component Value Range Status Comment   Specimen Description BLOOD RIGHT ARM   Final    Special Requests BOTTLES DRAWN AEROBIC AND ANAEROBIC 10CC   Final    Setup Time [id number]   Final    Culture     Final    Value:        BLOOD CULTURE RECEIVED NO GROWTH TO DATE CULTURE WILL BE HELD FOR 5 DAYS BEFORE ISSUING A FINAL NEGATIVE REPORT   Report Status PENDING   Incomplete   CULTURE, BLOOD (ROUTINE X 2)     Status: Normal (Preliminary result)   Collection Time   07/23/11  4:00 PM      Component Value Range Status Comment   Specimen Description BLOOD RIGHT ARM   Final    Special Requests BOTTLES DRAWN AEROBIC AND ANAEROBIC 10CC   Final    Setup Time [id number]   Final    Culture     Final    Value:        BLOOD CULTURE RECEIVED NO GROWTH TO DATE CULTURE WILL BE HELD FOR 5 DAYS BEFORE ISSUING A FINAL NEGATIVE REPORT  Report Status PENDING   Incomplete   MRSA PCR SCREENING     Status: Normal   Collection Time   07/23/11  8:45 PM      Component Value Range Status Comment   MRSA by PCR NEGATIVE  NEGATIVE  Final     Studies/Results: No results found.  Medications: Scheduled Meds:   . allopurinol  100 mg Oral QHS  . darbepoetin (ARANESP) injection - DIALYSIS  40 mcg Intravenous Q Tue-HD  . feeding supplement (NEPRO CARB STEADY)  237 mL Oral BID BM  . heparin  2,200 Units Dialysis Q T,Th,Sa-HD  . insulin aspart  0-9 Units Subcutaneous TID WC  . levETIRAcetam  250 mg Oral Daily  . levETIRAcetam  500 mg Oral QHS  . multivitamin  1 tablet Oral QHS  . paricalcitol  4 mcg Intravenous Q T,Th,Sa-HD  . piperacillin-tazobactam (ZOSYN)  IV  2.25 g Intravenous Q8H  . polyethylene glycol  17 g Oral Daily  . pregabalin  75 mg Oral Daily  . senna  1 tablet Oral Daily  . sevelamer  2,400 mg Oral TID WC  . warfarin  6 mg Oral ONCE-1800  . warfarin  7.5 mg Oral ONCE-1800   Continuous Infusions:  PRN  Meds:.acetaminophen, acetaminophen, albuterol, bisacodyl, calcium carbonate (dosed in mg elemental calcium), camphor-menthol, docusate sodium, heparin, heparin, hydrOXYzine, lidocaine, lidocaine-prilocaine, ondansetron (ZOFRAN) IV, ondansetron (ZOFRAN) IV, ondansetron, ondansetron, pentafluoroprop-tetrafluoroeth, sorbitol, zolpidem, DISCONTD: feeding supplement (NEPRO CARB STEADY)  Assessment  70 year old African American male patient with history of end-stage renal disease on hemodialysis Tuesdays Thursdays and Saturdays, atrial fibrillation on Coumadin, aortic stenosis, type 2 diabetes mellitus, hypertension, seizure disorder who is brought to the emergency room for the second time in 2 days for generalized weakness and lethargy. patient admitted to stepdown for hypotension with fever and AMS with concern for sepsis.   Plan: Hypotension  Reportedly patient has been having  hypotension related to his dialysis in the last few weeks-  -random cortisol negative -BP is consistently stable now.   ? Sepsis  There is no evidence of infiltrate on chest x-ray- -perhaps his fevers are due to a viral syndrome - influenza screen was negative  patient makes very occasional urine and have been unable to collect a sample on exam. - blood cultures are negative so far.  - wbc counts have normalized.  -dced vanco on 12/ 18. contkinue zosyn for now ( since 12/16) Will transition to po Levaquin if blood cx negative next 24 hrs  Altered mental status- resolved  Etiology is unclear at this time  -possibly dehydration/hypotension and resultant poor perfusion of brain  - Patient is noted to have mod-severe AS and and most likely is more sensitive to low BPs.  -b12 wnl folic acid levels pending  Mildly elevated troponin  Seen by cards on admission. No further w/up needed at this time. Patient had 18 beat run of vtach on 12/18. Mg level wnl. Cards consult was called again. He has been stable on tele overnight   pending cards reeval.  End stage renal disease  On scheduled HD Tu, th, sat  Atrial fibrillation with episodes of asymptomatic bradycardia  On chronic Coumadin therapy-followed by [facility] cardiology -INR still subtherapeutic. Dose to be adjusted per pharmacy  Aortic stenosis  Mod-severe per echo.   Diabetes mellitus  CBGs are well-controlled   Seizure disorder  Stable  Cont keppra  Anemia of end-stage renal disease  Hemoglobin appears to be stable   BPH  Renal has  discontinued medical therapy due to a possible connection to his hypotension    CODE STATUS: DNR   LOS: 3 days   [name] 07/26/2011, 3:02 PM

## 2011-07-26 NOTE — Progress Notes (Signed)
PHARMACY - ANTICOAGULATION CONSULT FOLLOW-UP NOTE  Pharmacy Consult for:  Coumadin and Zosyn  Indication: atrial fibrillation and empiric   Patient Data:   Allergies: No Known Allergies  Patient Measurements: Height: 5\' 10"  (177.8 cm) Weight: 180 lb 5.4 oz (81.8 kg) IBW/kg (Calculated) : 73  Adjusted Body Weight:   Vital Signs: Temp:  [98.5 F (36.9 C)-100.5 F (38.1 C)] 98.9 F (37.2 C) (12/19 0800) Pulse Rate:  [58-77] 68  (12/19 0800) Resp:  [16-23] 20  (12/19 0800) BP: (90-120)/(52-69) 113/62 mmHg (12/19 0800) SpO2:  [93 %-100 %] 96 % (12/19 0800) Weight:  [174 lb 13.2 oz (79.3 kg)-180 lb 5.4 oz (81.8 kg)] 180 lb 5.4 oz (81.8 kg) (12/19 0332)  Intake/Output from previous day:  Intake/Output Summary (Last 24 hours) at 07/26/11 0959 Last data filed at 07/26/11 0600  Gross per 24 hour  Intake   1060 ml  Output      4 ml  Net   1056 ml    Labs:  Basename 07/26/11 0450 07/25/11 0405 07/24/11 1642 07/24/11 0855 07/24/11 0400 07/23/11 1108  HGB -- 9.0* -- -- 9.0* --  HCT -- 28.7* -- -- 28.1* 30.2*  PLT -- 194 -- -- 172 181  APTT -- -- -- -- -- --  LABPROT 20.6* 21.1* -- -- 19.3* --  INR 1.73* 1.79* -- -- 1.60* --  HEPARINUNFRC -- -- -- -- -- --  CREATININE -- 8.56* -- -- 6.85* 5.28*  CKTOTAL -- -- 192 306* 365* --  CKMB -- -- 4.7* 5.0* 4.5* --  TROPONINI -- -- 0.40* 0.32* 0.42* --   Estimated Creatinine Clearance: 8.2 ml/min (by C-G formula based on Cr of 8.56).  Scheduled Medications:     . allopurinol  100 mg Oral QHS  . darbepoetin (ARANESP) injection - DIALYSIS  40 mcg Intravenous Q Tue-HD  . feeding supplement (NEPRO CARB STEADY)  237 mL Oral BID BM  . heparin  2,200 Units Dialysis Q T,Th,Sa-HD  . insulin aspart  0-9 Units Subcutaneous TID WC  . levETIRAcetam  250 mg Oral Daily  . levETIRAcetam  500 mg Oral QHS  . multivitamin  1 tablet Oral QHS  . paricalcitol  4 mcg Intravenous Q T,Th,Sa-HD  . piperacillin-tazobactam (ZOSYN)  IV  2.25 g  Intravenous Q8H  . polyethylene glycol  17 g Oral Daily  . pregabalin  75 mg Oral Daily  . senna  1 tablet Oral Daily  . sevelamer  2,400 mg Oral TID WC  . warfarin  6 mg Oral ONCE-1800  . DISCONTD: vancomycin  750 mg Intravenous Q T,Th,Sa-HD     Assessment:  70 y.o. male was admitted on 07/23/2011 with h/o atrial fibrillation on Coumadin. INR is below-goal. Patient is also on D4 Zosyn for empiric therapy. Zosyn dosing appropriate for HD.   Goal of Therapy:  1. INR 2-3   Plan:  1. Coumadin 7.5mg  po today. 2. Follow-up AM labs. 3. Continue Zosyn 2.25gm IV Q8H.    Dineen Kid Thad Ranger, PharmD 07/26/2011, 9:59 AM

## 2011-07-26 NOTE — Progress Notes (Signed)
Patient ID: DJANGO NGUYEN, male   DOB: 02/22/1941, 70 y.o.   MRN: [id number] S:reports to be feeling better and denies any emerging complaints. 18 beat run of VTach noted yesterday  O: BP 113/62  Pulse 68  Temp(Src) 98.9 F (37.2 C) (Oral)  Resp 20  Ht 5\' 10"  (1.778 m)  Wt 81.8 kg (180 lb 5.4 oz)  BMI 25.88 kg/m2  SpO2 96%  WUJ:WJXBJYNWGNF resting in bed AOZ:HYQMV Irregularly irregular. Normal S1 and S2 sounds Resp:CTA bilaterally, no rales/rhonchi HQI:ONGE, obese, NT, BS normal Ext:No LE edema. Rt FA AVF with good bruit/thrill      . allopurinol  100 mg Oral QHS  . darbepoetin (ARANESP) injection - DIALYSIS  40 mcg Intravenous Q Tue-HD  . feeding supplement (NEPRO CARB STEADY)  237 mL Oral BID BM  . heparin  2,200 Units Dialysis Q T,Th,Sa-HD  . insulin aspart  0-9 Units Subcutaneous TID WC  . levETIRAcetam  250 mg Oral Daily  . levETIRAcetam  500 mg Oral QHS  . multivitamin  1 tablet Oral QHS  . paricalcitol  4 mcg Intravenous Q T,Th,Sa-HD  . piperacillin-tazobactam (ZOSYN)  IV  2.25 g Intravenous Q8H  . polyethylene glycol  17 g Oral Daily  . pregabalin  75 mg Oral Daily  . senna  1 tablet Oral Daily  . sevelamer  2,400 mg Oral TID WC  . warfarin  6 mg Oral ONCE-1800  . warfarin  7.5 mg Oral ONCE-1800  . DISCONTD: vancomycin  750 mg Intravenous Q T,Th,Sa-HD    BMET  Lab 07/25/11 0405 07/24/11 0400 07/23/11 1108 07/22/11 0728  NA 133* 135 138 138  K 4.4 4.1 3.9 5.0  CL 93* 97 98 101  CO2 28 29 30 28   GLUCOSE 130* 84 104* 131*  BUN 63* 44* 30* 52*  CREATININE 8.56* 6.85* 5.28* 7.55*  ALB -- -- -- --  CALCIUM 9.4 9.5 9.6 9.2  PHOS 2.9 -- -- --   CBC  Lab 07/25/11 0405 07/24/11 0400 07/23/11 1108  WBC 7.7 11.8* 14.5*  NEUTROABS -- -- 10.6*  HGB 9.0* 9.0* 9.6*  HCT 28.7* 28.1* 30.2*  MCV 85.9 86.5 86.8  PLT 194 172 181    @IMGRELPRIORS @  Assessment/Plan: 1.AMS/Possible viral syndrome: on empiric zosyn for possible HCAP, but no convincing evidence of  PNA. May be able to transition to PO quinolone or amoxi-clav at discharge. Mental status back to baseline and non focal exam. ? Post-ictal 2.ESRD: Continue TTS HD Schedule with daily eval for emerging needs. 3. 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 Nutrition:Await albumin levels, satisfactory oral intake. On nepro supplement.  Dhanush Jokerst K.

## 2011-07-26 NOTE — Progress Notes (Signed)
   CARE MANAGEMENT NOTE 07/26/2011  Patient:  [name], [name]   Account Number:  [id number]  Date Initiated:  07/24/2011  Documentation initiated by:  Onnie Boer  Subjective/Objective Assessment:   PT WAS ADMITTED WITH SEPSIS     Action/Plan:   PROGRESSION OF CARE AND DISCHARGE PLANNING   Anticipated DC Date:  07/27/2011   Anticipated DC Plan:  HOME W HOME HEALTH SERVICES      DC Planning Services  CM consult      Choice offered to / List presented to:             Status of service:  In process, will continue to follow Medicare Important Message given?   (If response is "NO", the following Medicare IM given date fields will be blank) Date Medicare IM given:   Date Additional Medicare IM given:    Discharge Disposition:    Per UR Regulation:  Reviewed for med. necessity/level of care/duration of stay  Comments:  07/25/2011 Onnie Boer, RN, BSN 1523 PT IS BETTER TODAY AND IV ABXS ARE STOPPED.  WILL F/U ON DC NEEDS  UR COMPLETED 07/24/2011 Onnie Boer, RN, BSN 1536 PT WAS ADMITTED WITH SEPSIS.  PTA PT WAS AT HOME WITH A HH RN WITH [facility], WILL F/U ON DC NEEDS.

## 2011-07-27 ENCOUNTER — Inpatient Hospital Stay (HOSPITAL_COMMUNITY): Payer: Medicare Other

## 2011-07-27 LAB — RENAL FUNCTION PANEL
BUN: 54 mg/dL — ABNORMAL HIGH (ref 6–23)
Calcium: 9.3 mg/dL (ref 8.4–10.5)
Glucose, Bld: 111 mg/dL — ABNORMAL HIGH (ref 70–99)
Phosphorus: 3.9 mg/dL (ref 2.3–4.6)
Potassium: 3.8 mEq/L (ref 3.5–5.1)

## 2011-07-27 LAB — GLUCOSE, CAPILLARY: Glucose-Capillary: 82 mg/dL (ref 70–99)

## 2011-07-27 LAB — CBC
HCT: 27 % — ABNORMAL LOW (ref 39.0–52.0)
Hemoglobin: 8.4 g/dL — ABNORMAL LOW (ref 13.0–17.0)
MCH: 26.9 pg (ref 26.0–34.0)
MCHC: 31.1 g/dL (ref 30.0–36.0)
MCV: 86.5 fL (ref 78.0–100.0)

## 2011-07-27 MED ORDER — ESCITALOPRAM OXALATE 10 MG PO TABS
10.0000 mg | ORAL_TABLET | Freq: Every day | ORAL | Status: DC
  Administered 2011-07-27: 10 mg via ORAL
  Filled 2011-07-27 (×3): qty 1

## 2011-07-27 MED ORDER — WARFARIN SODIUM 7.5 MG PO TABS
7.5000 mg | ORAL_TABLET | Freq: Once | ORAL | Status: AC
  Administered 2011-07-27: 7.5 mg via ORAL
  Filled 2011-07-27: qty 1

## 2011-07-27 MED ORDER — PATIENT'S GUIDE TO USING COUMADIN BOOK
Freq: Once | Status: AC
  Administered 2011-07-27: 17:00:00
  Filled 2011-07-27: qty 1

## 2011-07-27 MED ORDER — PARICALCITOL 5 MCG/ML IV SOLN
INTRAVENOUS | Status: AC
  Administered 2011-07-27: 4 ug via INTRAVENOUS
  Filled 2011-07-27: qty 1

## 2011-07-27 NOTE — Progress Notes (Signed)
Patient ID: MUBARAK BEVENS, male   DOB: 05-Oct-1940, 70 y.o.   MRN: [id number]  S:Febrile overnight with Tmax 100.7.    O: BP 105/58  Pulse 73  Temp(Src) 98.6 F (37 C) (Oral)  Resp 17  Ht 5\' 10"  (1.778 m)  Wt 80.9 kg (178 lb 5.6 oz)  BMI 25.59 kg/m2  SpO2 96%  ZHY:QMVHQIONGEX resting on dialysis BMW:UXLKG irregular bradycardia, normal S1 and S2 sounds. Resp:CTA bilaterally, no rales/rhonchi MWN:UUVO, obese, NT, BS normal ZDG:UYQIH LE edema      . allopurinol  100 mg Oral QHS  . darbepoetin (ARANESP) injection - DIALYSIS  40 mcg Intravenous Q Tue-HD  . feeding supplement (NEPRO CARB STEADY)  237 mL Oral BID BM  . heparin  2,200 Units Dialysis Q T,Th,Sa-HD  . insulin aspart  0-9 Units Subcutaneous TID WC  . levETIRAcetam  250 mg Oral Daily  . levETIRAcetam  500 mg Oral QHS  . multivitamin  1 tablet Oral QHS  . paricalcitol  4 mcg Intravenous Q T,Th,Sa-HD  . piperacillin-tazobactam (ZOSYN)  IV  2.25 g Intravenous Q8H  . polyethylene glycol  17 g Oral Daily  . pregabalin  75 mg Oral Daily  . senna  1 tablet Oral Daily  . sevelamer  2,400 mg Oral TID WC  . warfarin  7.5 mg Oral ONCE-1800    BMET  Lab 07/27/11 0900 07/25/11 0405 07/24/11 0400 07/23/11 1108 07/22/11 0728  NA 137 133* 135 138 138  K 3.8 4.4 4.1 3.9 5.0  CL 96 93* 97 98 101  CO2 29 28 29 30 28   GLUCOSE 111* 130* 84 104* 131*  BUN 54* 63* 44* 30* 52*  CREATININE 7.83* 8.56* 6.85* 5.28* 7.55*  ALB -- -- -- -- --  CALCIUM 9.3 9.4 9.5 9.6 9.2  PHOS 3.9 2.9 -- -- --   CBC  Lab 07/27/11 0900 07/25/11 0405 07/24/11 0400 07/23/11 1108  WBC 7.4 7.7 11.8* 14.5*  NEUTROABS -- -- -- 10.6*  HGB 8.4* 9.0* 9.0* 9.6*  HCT 27.0* 28.7* 28.1* 30.2*  MCV 86.5 85.9 86.5 86.8  PLT 196 194 172 181   Blood Culture    Component Value Date/Time   SDES BLOOD RIGHT ARM 07/23/2011 1600   SPECREQUEST BOTTLES DRAWN AEROBIC AND ANAEROBIC 10CC 07/23/2011 1600   CULT        BLOOD CULTURE RECEIVED NO GROWTH TO DATE CULTURE  WILL BE HELD FOR 5 DAYS BEFORE ISSUING A FINAL NEGATIVE REPORT 07/23/2011 1600   REPTSTATUS PENDING 07/23/2011 1600     Assessment/Plan:  1.AMS/Possible viral syndrome: on empiric zosyn for possible HCAP, but no convincing evidence of PNA. May be able to transition to PO quinolone or amoxi-clav at discharge. Mental status back to baseline and non focal exam. Blood cultures negative to date. 2.ESRD: Continue TTS HD- currently on dialysis. Will do daily eval for emerging needs.  3. Anemia:Low Hemoglobin, on ESA, no overt losses. 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 Nutrition:Await albumin levels, satisfactory oral intake. On nepro supplement.   Shaeley Segall K.

## 2011-07-27 NOTE — Progress Notes (Signed)
PHARMACY - ANTICOAGULATION CONSULT FOLLOW-UP NOTE  Pharmacy Consult for:  Coumadin and Zosyn  Indication: atrial fibrillation and empiric   Patient Data:   Allergies: No Known Allergies  Patient Measurements: Height: 5\' 10"  (177.8 cm) Weight: 178 lb 5.6 oz (80.9 kg) IBW/kg (Calculated) : 73   Vital Signs: Temp:  [98.6 F (37 C)-100.6 F (38.1 C)] 98.6 F (37 C) (12/19 2220) Pulse Rate:  [59-78] 65  (12/20 1130) Resp:  [15-24] 20  (12/20 1130) BP: (101-139)/(58-86) 113/73 mmHg (12/20 1130) SpO2:  [95 %-100 %] 96 % (12/20 0759) Weight:  [178 lb 5.6 oz (80.9 kg)] 178 lb 5.6 oz (80.9 kg) (12/20 0759)  Intake/Output from previous day:  Intake/Output Summary (Last 24 hours) at 07/27/11 1145 Last data filed at 07/26/11 1200  Gross per 24 hour  Intake    320 ml  Output      0 ml  Net    320 ml    Labs:  Basename 07/27/11 0900 07/27/11 0610 07/26/11 0450 07/25/11 0405 07/24/11 1642  HGB 8.4* -- -- 9.0* --  HCT 27.0* -- -- 28.7* --  PLT 196 -- -- 194 --  APTT -- -- -- -- --  LABPROT -- 20.5* 20.6* 21.1* --  INR -- 1.72* 1.73* 1.79* --  HEPARINUNFRC -- -- -- -- --  CREATININE 7.83* -- -- 8.56* --  CKTOTAL -- -- -- -- 192  CKMB -- -- -- -- 4.7*  TROPONINI -- -- -- -- 0.40*   Estimated Creatinine Clearance: 8.9 ml/min (by C-G formula based on Cr of 7.83).  Scheduled Medications:     . allopurinol  100 mg Oral QHS  . darbepoetin (ARANESP) injection - DIALYSIS  40 mcg Intravenous Q Tue-HD  . feeding supplement (NEPRO CARB STEADY)  237 mL Oral BID BM  . heparin  2,200 Units Dialysis Q T,Th,Sa-HD  . insulin aspart  0-9 Units Subcutaneous TID WC  . levETIRAcetam  250 mg Oral Daily  . levETIRAcetam  500 mg Oral QHS  . multivitamin  1 tablet Oral QHS  . paricalcitol  4 mcg Intravenous Q T,Th,Sa-HD  . piperacillin-tazobactam (ZOSYN)  IV  2.25 g Intravenous Q8H  . polyethylene glycol  17 g Oral Daily  . pregabalin  75 mg Oral Daily  . senna  1 tablet Oral Daily  .  sevelamer  2,400 mg Oral TID WC  . warfarin  7.5 mg Oral ONCE-1800  . warfarin  7.5 mg Oral ONCE-1800     Assessment:  70 y.o. male was admitted on 07/23/2011 with h/o atrial fibrillation on Coumadin. INR is below-goal. Patient is also on D5 Zosyn for empiric HCAP therapy. Zosyn dosing appropriate for HD.   Goal of Therapy:  1. INR 2-3   Plan:  1. Coumadin 7.5mg  po today. 2. Follow-up AM labs. 3. Continue Zosyn 2.25gm IV Q8H.    Junita Push, PharmD, BCPS 07/27/2011, 11:45 AM

## 2011-07-27 NOTE — Progress Notes (Signed)
Subjective: Patient seen and examined after HD. Denies any symptoms. Had tmax of 100.6 yesterday  Objective:  Vital signs in last 24 hours:  Filed Vitals:   07/27/11 1230 07/27/11 1244 07/27/11 1252 07/27/11 1359  BP: 108/61 122/65 113/66 122/81  Pulse: 53 58 64 63  Temp:    98.2 F (36.8 C)  TempSrc:    Oral  Resp: 20 18  18   Height:      Weight:  78 kg (171 lb 15.3 oz)    SpO2:    96%    Intake/Output from previous day:   Intake/Output Summary (Last 24 hours) at 07/27/11 1620 Last data filed at 07/27/11 1252  Gross per 24 hour  Intake      0 ml  Output   2000 ml  Net  -2000 ml    Physical Exam:  General: elderly male in no acute distress.  HEENT: no pallor, no icterus, moist oral mucosa, no JVD, no lymphadenopathy  Heart: s1&s2 irregular,, 2/6 systolic murmur rubs, gallops.  Lungs: Clear to auscultation bilaterally.  Abdomen: Soft, nontender, nondistended, positive bowel sounds.  Extremities: No clubbing cyanosis or edema with positive pedal pulses. Rt arm AV fistula Neuro: Alert, awake, oriented x3, nonfocal.   Lab Results:  Basic Metabolic Panel:    Component Value Date/Time   NA 137 07/27/2011 0900   K 3.8 07/27/2011 0900   CL 96 07/27/2011 0900   CO2 29 07/27/2011 0900   BUN 54* 07/27/2011 0900   CREATININE 7.83* 07/27/2011 0900   GLUCOSE 111* 07/27/2011 0900   CALCIUM 9.3 07/27/2011 0900   CBC:    Component Value Date/Time   WBC 7.4 07/27/2011 0900   HGB 8.4* 07/27/2011 0900   HCT 27.0* 07/27/2011 0900   PLT 196 07/27/2011 0900   MCV 86.5 07/27/2011 0900   NEUTROABS 10.6* 07/23/2011 1108   LYMPHSABS 1.4 07/23/2011 1108   MONOABS 2.4* 07/23/2011 1108   EOSABS 0.0 07/23/2011 1108   BASOSABS 0.0 07/23/2011 1108    Recent Results (from the past 240 hour(s))  CULTURE, BLOOD (ROUTINE X 2)     Status: Normal (Preliminary result)   Collection Time   07/23/11  3:45 PM      Component Value Range Status Comment   Specimen Description BLOOD RIGHT  ARM   Final    Special Requests BOTTLES DRAWN AEROBIC AND ANAEROBIC 10CC   Final    Setup Time [id number]   Final    Culture     Final    Value:        BLOOD CULTURE RECEIVED NO GROWTH TO DATE CULTURE WILL BE HELD FOR 5 DAYS BEFORE ISSUING A FINAL NEGATIVE REPORT   Report Status PENDING   Incomplete   CULTURE, BLOOD (ROUTINE X 2)     Status: Normal (Preliminary result)   Collection Time   07/23/11  4:00 PM      Component Value Range Status Comment   Specimen Description BLOOD RIGHT ARM   Final    Special Requests BOTTLES DRAWN AEROBIC AND ANAEROBIC 10CC   Final    Setup Time [id number]   Final    Culture     Final    Value:        BLOOD CULTURE RECEIVED NO GROWTH TO DATE CULTURE WILL BE HELD FOR 5 DAYS BEFORE ISSUING A FINAL NEGATIVE REPORT   Report Status PENDING   Incomplete   MRSA PCR SCREENING     Status: Normal   Collection Time  07/23/11  8:45 PM      Component Value Range Status Comment   MRSA by PCR NEGATIVE  NEGATIVE  Final     Studies/Results: No results found.  Medications: Scheduled Meds:   . allopurinol  100 mg Oral QHS  . darbepoetin (ARANESP) injection - DIALYSIS  40 mcg Intravenous Q Tue-HD  . feeding supplement (NEPRO CARB STEADY)  237 mL Oral BID BM  . heparin  2,200 Units Dialysis Q T,Th,Sa-HD  . insulin aspart  0-9 Units Subcutaneous TID WC  . levETIRAcetam  250 mg Oral Daily  . levETIRAcetam  500 mg Oral QHS  . multivitamin  1 tablet Oral QHS  . paricalcitol  4 mcg Intravenous Q T,Th,Sa-HD  . patient's guide to using coumadin book   Does not apply Once  . piperacillin-tazobactam (ZOSYN)  IV  2.25 g Intravenous Q8H  . polyethylene glycol  17 g Oral Daily  . pregabalin  75 mg Oral Daily  . senna  1 tablet Oral Daily  . sevelamer  2,400 mg Oral TID WC  . warfarin  7.5 mg Oral ONCE-1800  . warfarin  7.5 mg Oral ONCE-1800   Continuous Infusions:  PRN Meds:.acetaminophen, acetaminophen, albuterol, bisacodyl, calcium carbonate (dosed in mg elemental  calcium), camphor-menthol, docusate sodium, heparin, heparin, hydrOXYzine, lidocaine, lidocaine-prilocaine, ondansetron (ZOFRAN) IV, ondansetron (ZOFRAN) IV, ondansetron, ondansetron, pentafluoroprop-tetrafluoroeth, sorbitol, zolpidem  Assessment/Plan: Assessment  70 year old [place] American male patient with history of end-stage renal disease on hemodialysis Tuesdays Thursdays and Saturdays, atrial fibrillation on Coumadin, aortic stenosis, type 2 diabetes mellitus, hypertension, seizure disorder who is brought to the emergency room for the second time in 2 days for generalized weakness and lethargy. patient admitted to stepdown for hypotension with fever and AMS with concern for sepsis.   Plan:  Hypotension  Reportedly patient has been having hypotension related to his dialysis in the last few weeks -random cortisol negative  -BP is consistently stable now.   ? Sepsis  Unclear source with persistent fever -no evidence of infiltrate on chest x-ray -perhaps his fevers are due to a viral syndrome  - influenza screen was negative  patient makes very occasional urine and have been unable to collect a sample  - blood cultures are negative so far.  - wbc counts have normalized.  -dced vanco on 12/ 18. contkinue zosyn for now ( since 12/16) . Will transition to po quinolone if remains fewbrile Patient has hx of  moderate AS and a systolic murmur on exam. Echo this admission shows trivial MR as well. Not sure if this was present on last echo.   if fever persists will ask cardiology to reevaluate the echo to r/o a possible ?vegetation  Altered mental status- resolved  Etiology is unclear at this time  -possibly dehydration/hypotension and resultant poor perfusion of brain  - Patient is noted to have mod-severe AS and and most likely is more sensitive to low BPs.  -b12 wnl folic acid levels pending   Mildly elevated troponin  Seen by cards on admission. No further w/up needed at this time.  Patient had 18 beat run of vtach on 12/18. Mg level wnl. Cards consult was called again. He has been stable on tele    End stage renal disease  On scheduled HD Tu, th, sat   Atrial fibrillation with episodes of asymptomatic bradycardia  On chronic Coumadin therapy-followed by [facility] cardiology  -INR still subtherapeutic. Dose to be adjusted per pharmacy   Aortic stenosis  Mod-severe per echo.  Diabetes mellitus  CBGs are well-controlled   Seizure disorder  Stable  Cont keppra   Anemia of end-stage renal disease  Hemoglobin appears to be stable   BPH  discontinued medical therapy due to a possible connection to his hypotension   CODE STATUS: DNR    LOS: 4 days   [name] 07/27/2011, 4:20 PM

## 2011-07-27 NOTE — Procedures (Signed)
I was present at this session.  I have reviewed the session itself and made appropriate changes. BP 136/76 BFR 400 UF goal 2.5L  Patrick Vicencio K. 12/20/201210:01 AM

## 2011-07-28 LAB — URINALYSIS, ROUTINE W REFLEX MICROSCOPIC
Bilirubin Urine: NEGATIVE
Ketones, ur: NEGATIVE mg/dL
Nitrite: NEGATIVE
pH: 8 (ref 5.0–8.0)

## 2011-07-28 LAB — PROTIME-INR
INR: 1.6 — ABNORMAL HIGH (ref 0.00–1.49)
Prothrombin Time: 19.3 seconds — ABNORMAL HIGH (ref 11.6–15.2)

## 2011-07-28 LAB — URINE MICROSCOPIC-ADD ON

## 2011-07-28 LAB — GLUCOSE, CAPILLARY: Glucose-Capillary: 116 mg/dL — ABNORMAL HIGH (ref 70–99)

## 2011-07-28 MED ORDER — WARFARIN SODIUM 10 MG PO TABS: 10.0000 mg | ORAL_TABLET | Freq: Every day | ORAL | Status: DC

## 2011-07-28 MED ORDER — WARFARIN SODIUM 2.5 MG PO TABS: 2.5000 mg | ORAL_TABLET | Freq: Every day | ORAL | Status: DC

## 2011-07-28 MED ORDER — WARFARIN SODIUM 10 MG PO TABS
10.0000 mg | ORAL_TABLET | Freq: Once | ORAL | Status: AC
  Administered 2011-07-28: 10 mg via ORAL
  Filled 2011-07-28: qty 1

## 2011-07-28 NOTE — Progress Notes (Signed)
Clinical Social Worker completed psychosocial assessment and placed in shadow chart. PT recommendation for SNF at discharge. Pt agreeable to SNF search to [facility] as he had previously completed therapy there. Clinical Social Worker sent information and received phone call from [facility] stating that pt had used all of his Medicare days at [facility] and could only be admitted as private pay. Clinical Child psychotherapist discussed this with pt and pt daughter and pt does not currently have the resources to pay privately at the SNF. CM joined Child psychotherapist to visit patient and pt agreeable to returning home with home health services and adding a social worker to the home health services to assist when any further needs in terms of placement. Clinical Social Worker signing off at this time. MD informed and plan is for pt to D/C today. Per pt daughter, she will be unable to pick up pt until after 5 pm.  Jacklynn Lewis, MSW, [facility]  Clinical Social Work [phone]

## 2011-07-28 NOTE — Progress Notes (Signed)
Nutrition Follow-up  Diet Order:  Carb modified with renal restrictions  Meds: Scheduled Meds:   . allopurinol  100 mg Oral QHS  . darbepoetin (ARANESP) injection - DIALYSIS  40 mcg Intravenous Q Tue-HD  . escitalopram  10 mg Oral QHS  . feeding supplement (NEPRO CARB STEADY)  237 mL Oral BID BM  . heparin  2,200 Units Dialysis Q T,Th,Sa-HD  . insulin aspart  0-9 Units Subcutaneous TID WC  . levETIRAcetam  250 mg Oral Daily  . levETIRAcetam  500 mg Oral QHS  . multivitamin  1 tablet Oral QHS  . paricalcitol  4 mcg Intravenous Q T,Th,Sa-HD  . patient's guide to using coumadin book   Does not apply Once  . piperacillin-tazobactam (ZOSYN)  IV  2.25 g Intravenous Q8H  . polyethylene glycol  17 g Oral Daily  . pregabalin  75 mg Oral Daily  . senna  1 tablet Oral Daily  . sevelamer  2,400 mg Oral TID WC  . warfarin  7.5 mg Oral ONCE-1800   Continuous Infusions:  PRN Meds:.acetaminophen, acetaminophen, albuterol, bisacodyl, calcium carbonate (dosed in mg elemental calcium), camphor-menthol, docusate sodium, heparin, heparin, hydrOXYzine, lidocaine, lidocaine-prilocaine, ondansetron (ZOFRAN) IV, ondansetron (ZOFRAN) IV, ondansetron, ondansetron, pentafluoroprop-tetrafluoroeth, sorbitol, zolpidem  Labs:  CMP     Component Value Date/Time   NA 137 07/27/2011 0900   K 3.8 07/27/2011 0900   CL 96 07/27/2011 0900   CO2 29 07/27/2011 0900   GLUCOSE 111* 07/27/2011 0900   BUN 54* 07/27/2011 0900   CREATININE 7.83* 07/27/2011 0900   CALCIUM 9.3 07/27/2011 0900   PROT 7.2 07/23/2011 1108   ALBUMIN 2.0* 07/27/2011 0900   AST 20 07/23/2011 1108   ALT 15 07/23/2011 1108   ALKPHOS 97 07/23/2011 1108   BILITOT 0.7 07/23/2011 1108   GFRNONAA 6* 07/27/2011 0900   GFRAA 7* 07/27/2011 0900     Intake/Output Summary (Last 24 hours) at 07/28/11 0811 Last data filed at 07/28/11 0600  Gross per 24 hour  Intake    200 ml  Output   2001 ml  Net  -1801 ml    Weight Status:  189 lbs (85.9 kg),  trending up since admission, weights taken pre-dialysis.   Patient reports PO intake is normal for him. Patient requested renal diet information to take home. Patient feels he is not getting enough protein at home, and reports difficulty following a renal diet at home.   Nutrition Dx:  Increased nutrient needs, ongoing  Goal:  PO intake to be >75% to meet estimated needs, unmet.   Intervention:  Continue with Nepro BID  Monitor:  PO intake, labs, weight trends, education needs, I/O's   Rudean Haskell Pager #:  [phone]

## 2011-07-28 NOTE — Progress Notes (Signed)
PT SHOULD BE DC'D TO HOME WITH HH RN/PT/OT/SW WITH [facility].  PT HAS USED ALL OF HIS MEDICARE DAYS AND NEEDS TO APPLY FOR MEDICAID IN THE FUTURE.  PT HAS USED ALL OF HIS MEDICARE DAYS AT [facility] PLACE.   Willa Rough 07/28/2011 [phone] OR [phone]

## 2011-07-28 NOTE — Discharge Summary (Addendum)
Patient ID: [name] MRN: [id number] DOB/AGE: [birth year] 70 y.o.  Admit date: 07/23/2011 Discharge date: 07/28/2011  Primary Care Physician:  Candi Leash, MD  Discharge Diagnoses:    Present on Admission:  Sepsis possibly related to viral infection .End stage renal disease .Atrial fibrillation on coumadin .Aortic stenosis .Hypotension .Diabetes mellitus . Troponemia    Current Discharge Medication List    CONTINUE these medications which have CHANGED   Details  !! warfarin (COUMADIN) 10 MG tablet Take 1 tablet (10 mg total) by mouth daily. Qty: 30 tablet, Refills: 0    !! warfarin (COUMADIN) 2.5 MG tablet Take 1 tablet (2.5 mg total) by mouth daily. Qty: 30 tablet, Refills: 0     !! - Potential duplicate medications found. Please discuss with provider.    CONTINUE these medications which have NOT CHANGED   Details  acetaminophen (TYLENOL) 325 MG tablet Take 650 mg by mouth as needed. Headache pain or fever    allopurinol (ZYLOPRIM) 100 MG tablet Take 100 mg by mouth at bedtime.     b complex-vitamin c-folic acid (NEPHRO-VITE) 0.8 MG TABS Take 0.8 mg by mouth at bedtime.      bisacodyl (BISCOLAX) 10 MG suppository Place 10 mg rectally as needed. constipation    escitalopram (LEXAPRO) 10 MG tablet Take 10 mg by mouth at bedtime.     !! levETIRAcetam (KEPPRA) 250 MG tablet Take 250 mg by mouth daily.      !! levETIRAcetam (KEPPRA) 500 MG tablet Take 500 mg by mouth at bedtime.     nitroGLYCERIN (NITROSTAT) 0.4 MG SL tablet Place 0.4 mg under the tongue every 5 (five) minutes as needed. Chest pain    polyethylene glycol (MIRALAX / GLYCOLAX) packet Take 17 g by mouth daily. For constipation     pregabalin (LYRICA) 75 MG capsule Take 75 mg by mouth daily.     senna (SENOKOT) 8.6 MG TABS Take 1 tablet by mouth daily.      sevelamer (RENAGEL) 800 MG tablet Take 2,400 mg by mouth 3 (three) times daily with meals.     Tamsulosin HCl (FLOMAX) 0.4 MG CAPS Take  0.4 mg by mouth daily.      !! - Potential duplicate medications found. Please discuss with provider.      Disposition and Follow-up:  Follow up with PCP in 1 week Follow up with scheduled HD AS Out pt  Consults:   Dr Allena Katz ( renal) Marca Ancona ( cardiology)  Significant Diagnostic Studies:  Dg Chest 2 View  07/23/2011  *RADIOLOGY REPORT*  Clinical Data: Low grade fever, hypertension, end stage renal disease  CHEST - 2 VIEW  Comparison: 07/22/2011; 02/09/2011  Findings: Unchanged cardiac silhouette and mediastinal contours. No focal parenchymal opacities with apparent opacity overlying the right lower lung favored to be artifactual secondary to a confluence of overlying osseous and soft tissue structures.  No pleural effusion or pneumothorax.  Grossly unchanged bones.  IMPRESSION: No acute cardiopulmonary disease.  Specifically, no evidence of pneumonia.  Original Report Authenticated By: Waynard Reeds, M.D.    Brief H and P: For complete details please refer to admission H and P, but in brief 70 year old African American male patient with history of end-stage renal disease on hemodialysis Tuesdays Thursdays and Saturdays, atrial fibrillation on Coumadin, aortic stenosis, type 2 diabetes mellitus, hypertension, seizure disorder who is brought to the emergency room for the second time in 2 days for generalized weakness and lethargy. Patient is unable to provide significant  history secondary to his drowsy state. He however is arousable and indicates that apart from weakness he has no other complaints. Patient's daughter and health care power of attorney Ms. Dorna Mai who is at the bedside is providing history. Patient has recently completed a course of doxycycline which was started for urinary tract infection. Patient was in his usual state of health until yesterday morning. When she went to wake him up patient was extremely weak and lethargic. His blood sugars were normal however his  blood pressures were low in the 80s. EMS was summoned. Patient was brought to the emergency department. Subsequently he was discharged and went for his scheduled dialysis. Per the historian patient has had issues with low blood pressures post dialysis for the last couple of times. He slept all night which is unusual for him. He usually sits up late and watches movies on his portable DVD player. This morning again the patient was found to be weak and lethargic and somnolent. He felt hot but his temperature was not checked. He however has been eating reasonably. He complained of tinnitus in the left ear but no sore throat or earache. No chest pain, dyspnea, cough. No nausea vomiting abdominal pain or diarrhea. He makes very little urine. He was exposed to the historian who had symptoms of upper respiratory tract infection and myalgia. He was brought to the emergency room where he was found to be hypotensive in the 80s, mild leukocytosis with and elevated troponins. Cardiology is consulted and do not believe that the elevated troponins are secondary to acute coronary syndrome. He is being treated for presumed sepsis. After IV fluid boluses systolic now is in the 90s.    Physical Exam on Discharge:  Filed Vitals:   07/27/11 2140 07/28/11 0153 07/28/11 0413 07/28/11 1300  BP: 109/72  146/88 109/69  Pulse: 66  72 75  Temp: 99 F (37.2 C)  98 F (36.7 C) 97.3 F (36.3 C)  TempSrc: Oral  Oral Oral  Resp: 18  18 18   Height:      Weight:  85.9 kg (189 lb 6 oz)    SpO2: 97%  98% 92%     Intake/Output Summary (Last 24 hours) at 07/28/11 1450 Last data filed at 07/28/11 0745  Gross per 24 hour  Intake    440 ml  Output      1 ml  Net    439 ml   General: elderly male in no acute distress.  HEENT: no pallor, no icterus, moist oral mucosa, no JVD, no lymphadenopathy  Heart: s1&s2 irregular,, 2/6 systolic murmur rubs, gallops.  Lungs: Clear to auscultation bilaterally.  Abdomen: Soft, nontender,  nondistended, positive bowel sounds.  Extremities: No clubbing cyanosis or edema with positive pedal pulses. Rt arm AV fistula  Neuro: Alert, awake, oriented x3, nonfocal.   CBC:    Component Value Date/Time   WBC 7.4 07/27/2011 0900   HGB 8.4* 07/27/2011 0900   HCT 27.0* 07/27/2011 0900   PLT 196 07/27/2011 0900   MCV 86.5 07/27/2011 0900   NEUTROABS 10.6* 07/23/2011 1108   LYMPHSABS 1.4 07/23/2011 1108   MONOABS 2.4* 07/23/2011 1108   EOSABS 0.0 07/23/2011 1108   BASOSABS 0.0 07/23/2011 1108    Basic Metabolic Panel:    Component Value Date/Time   NA 137 07/27/2011 0900   K 3.8 07/27/2011 0900   CL 96 07/27/2011 0900   CO2 29 07/27/2011 0900   BUN 54* 07/27/2011 0900   CREATININE 7.83* 07/27/2011  0900   GLUCOSE 111* 07/27/2011 0900   CALCIUM 9.3 07/27/2011 0900    Hospital Course:   Hypotension  Reportedly patient has been having hypotension related to his dialysis in the last few weeks  -random cortisol negative  -BP is consistently stable now.. flomax was held on admission and can be continued upon d/c. He is not on any BP medications    Sepsis  Unclear source with persistent fever for few days. Likely viral in etiology -no evidence of infiltrate on chest x-ray  - influenza screen was negative  patient makes very occasional urine and have been unable to collect a sample  - blood cultures are negative so far.  - wbc counts have normalized.  -dced vanco on 12/ 18. Received zosyn for 6 days and now dced on discharge .  Patient has hx of moderate AS and a systolic murmur on exam. Echo this admission shows trivial MR as well. Cardiology consult have evaluated patient this admission and his Echo seems unchanged and  -he has been afebrile for past 36 hours and with negative cultures. He was seen by PT initially and recommended SNF. He however has been much stable today ad able to ambulate well. He can go home with PT.  Altered mental status - resolved  Etiology is  unclear -possibly dehydration/hypotension associated with underlying sepsis - Patient is noted to have mod-severe AS and and most likely is more sensitive to low BPs.   Mildly elevated troponin  Seen by cardiology  on admission. No further w/up needed at this time. Patient had 18 beat run of vtach on 12/18. Mg level wnl. He has been asymptomatic and stable on tele   End stage renal disease  On scheduled HD Tu, th, sat . Renal team following   Atrial fibrillation with episodes of asymptomatic bradycardia  On chronic Coumadin therapy-followed by [facility] cardiology  -INR still subtherapeutic. Dose adjusted per pharmacy . His INR today is 1.73 on 10 mg coumadin . i will discharge him on 12.5 mg coumadin and he gets his INR checked at dialysis and coumadin dose is  adjusted by renal as per patient.     His remaining medical issues were stable  Patient clinically stable . Seen by PT and recommended home with services. He will be discharged home with RN, PT and SW.  Time spent on Discharge: 45 minutes  Signed: Eddie North 07/28/2011, 2:50 PM

## 2011-07-28 NOTE — Progress Notes (Signed)
Physical Therapy Treatment Patient Details Name: [name] MRN: [id number] DOB: [birth year] Today's Date: 07/28/2011  PT Assessment/Plan  PT - Assessment/Plan Comments on Treatment Session: Progress this session.  Patient reports he is about at his baseline functional mobility (from when he left [facility]).  Patient has 24 hour assist at home.  They have a 3-N-1 used for elevated toilet and for tub seat, a RW, and a wheelchair.  Feel patient could go home with this level of assist with HHPT for continued therapy. PT Plan: Discharge plan needs to be updated;Frequency needs to be updated PT Frequency: Min 3X/week Follow Up Recommendations: Home health PT;24 hour supervision/assistance Equipment Recommended: None recommended by PT PT Goals  Acute Rehab PT Goals PT Goal: Supine/Side to Sit - Progress: Progressing toward goal PT Goal: Sit to Stand - Progress: Progressing toward goal PT Goal: Stand to Sit - Progress: Met PT Goal: Ambulate - Progress: Progressing toward goal  PT Treatment Precautions/Restrictions  Precautions Precautions: Fall Required Braces or Orthoses: No Restrictions Weight Bearing Restrictions: No Mobility (including Balance) Bed Mobility Bed Mobility: Yes Supine to Sit: 3: Mod assist;With rails;HOB elevated (Comment degrees) (HOB at 20 degrees) Supine to Sit Details (indicate cue type and reason): Cues for technique.  Assist to lift trunk from bed Sitting - Scoot to Edge of Bed: 4: Min assist;With rail Sitting - Scoot to [place] of Bed Details (indicate cue type and reason): Cues to scoot one hip forward at a time Transfers Transfers: Yes Sit to Stand: 4: Min assist;With upper extremity assist;From bed Sit to Stand Details (indicate cue type and reason): Cues for safe hand placement.  Assist mainly for balance during transition Stand to Sit: 4: Min assist;With upper extremity assist;With armrests;To chair/3-in-1 Stand to Sit Details: Cues to reach back for  chair armrests and to slowly lower to chair Ambulation/Gait Ambulation/Gait: Yes Ambulation/Gait Assistance: 4: Min assist Ambulation/Gait Assistance Details (indicate cue type and reason): LLE weak.  Patient unweights and then uses body moving forward and gravity to assist LLE swing forward.  Per patient, this is his baseline gait pattern.  Patient uses RW safely. Ambulation Distance (Feet): 22 Feet Assistive device: Rolling walker Gait Pattern: Step-to pattern;Decreased stride length;Decreased dorsiflexion - left;Decreased weight shift to left;Trunk flexed Gait velocity: Slow gait speed Stairs: No (Family uses wheelchair to take patient into house )    Exercise    End of Session PT - End of Session Equipment Utilized During Treatment: Gait belt Activity Tolerance: Patient tolerated treatment well Patient left: in chair;with call bell in reach Nurse Communication: Mobility status for transfers General Behavior During Session: [facility] for tasks performed Cognition: [facility] for tasks performed  Vena Austria [phone] 07/28/2011, 2:38 PM

## 2011-07-28 NOTE — Progress Notes (Signed)
PHARMACY - ANTICOAGULATION CONSULT FOLLOW-UP NOTE  Pharmacy Consult for:  Coumadin and Zosyn  Indication: atrial fibrillation and empiric   Patient Data:   Allergies: No Known Allergies  Patient Measurements: Height: 5\' 10"  (177.8 cm) Weight: 189 lb 6 oz (85.9 kg) IBW/kg (Calculated) : 73   Vital Signs: Temp:  [98 F (36.7 C)-99 F (37.2 C)] 98 F (36.7 C) (12/21 0413) Pulse Rate:  [53-72] 72  (12/21 0413) Resp:  [18-20] 18  (12/21 0413) BP: (108-146)/(61-88) 146/88 mmHg (12/21 0413) SpO2:  [96 %-98 %] 98 % (12/21 0413) Weight:  [171 lb 15.3 oz (78 kg)-189 lb 6 oz (85.9 kg)] 189 lb 6 oz (85.9 kg) (12/21 0153)  Intake/Output from previous day:  Intake/Output Summary (Last 24 hours) at 07/28/11 1057 Last data filed at 07/28/11 0600  Gross per 24 hour  Intake    200 ml  Output   2001 ml  Net  -1801 ml    Labs:  Basename 07/28/11 0548 07/27/11 0900 07/27/11 0610 07/26/11 0450  HGB -- 8.4* -- --  HCT -- 27.0* -- --  PLT -- 196 -- --  APTT -- -- -- --  LABPROT 19.3* -- 20.5* 20.6*  INR 1.60* -- 1.72* 1.73*  HEPARINUNFRC -- -- -- --  CREATININE -- 7.83* -- --  CKTOTAL -- -- -- --  CKMB -- -- -- --  TROPONINI -- -- -- --   Estimated Creatinine Clearance: 8.9 ml/min (by C-G formula based on Cr of 7.83).  Scheduled Medications:     . allopurinol  100 mg Oral QHS  . darbepoetin (ARANESP) injection - DIALYSIS  40 mcg Intravenous Q Tue-HD  . escitalopram  10 mg Oral QHS  . feeding supplement (NEPRO CARB STEADY)  237 mL Oral BID BM  . heparin  2,200 Units Dialysis Q T,Th,Sa-HD  . insulin aspart  0-9 Units Subcutaneous TID WC  . levETIRAcetam  250 mg Oral Daily  . levETIRAcetam  500 mg Oral QHS  . multivitamin  1 tablet Oral QHS  . paricalcitol  4 mcg Intravenous Q T,Th,Sa-HD  . patient's guide to using coumadin book   Does not apply Once  . piperacillin-tazobactam (ZOSYN)  IV  2.25 g Intravenous Q8H  . polyethylene glycol  17 g Oral Daily  . pregabalin  75 mg  Oral Daily  . senna  1 tablet Oral Daily  . sevelamer  2,400 mg Oral TID WC  . warfarin  7.5 mg Oral ONCE-1800     Assessment:  70 y.o. male was admitted on 07/23/2011 with h/o atrial fibrillation on Coumadin. INR is below-goal and continues to decrease.  Spoke with patient and his daughter who actually state his home dose is 9mg  (5mg  tab + 4mg  tab nightly) instead of the previously documented dose of 5mg .  Goal of Therapy:  1. INR 2-3   Plan:  1. Coumadin 10 mg po today. 2. Follow-up AM labs. 3. Continue Zosyn 2.25gm IV Q8H. 4. If d/c home, would recommend d/c on previous warfarin dose of 9mg  nightly.    Junita Push, PharmD, BCPS 07/28/2011, 10:57 AM

## 2011-07-28 NOTE — Progress Notes (Signed)
Patient ID: [name], male   DOB: 15-Dec-1940, 70 y.o.   MRN: [id number]   S:Reports to be feeling well and denies any SOB/CP   O: BP 146/88  Pulse 72  Temp(Src) 98 F (36.7 C) (Oral)  Resp 18  Ht 5\' 10"  (1.778 m)  Wt 85.9 kg (189 lb 6 oz)  BMI 27.17 kg/m2  SpO2 98%  YQM:VHQIONGEXBM resting in bed  WUX:LKGMW RRR, normal S1 and S2 Resp:CTA bilaterally, no rales/rhonchi NUU:VOZD, flat, NT, BS normal Ext:No Le edema      . allopurinol  100 mg Oral QHS  . darbepoetin (ARANESP) injection - DIALYSIS  40 mcg Intravenous Q Tue-HD  . escitalopram  10 mg Oral QHS  . feeding supplement (NEPRO CARB STEADY)  237 mL Oral BID BM  . heparin  2,200 Units Dialysis Q T,Th,Sa-HD  . insulin aspart  0-9 Units Subcutaneous TID WC  . levETIRAcetam  250 mg Oral Daily  . levETIRAcetam  500 mg Oral QHS  . multivitamin  1 tablet Oral QHS  . paricalcitol  4 mcg Intravenous Q T,Th,Sa-HD  . patient's guide to using coumadin book   Does not apply Once  . piperacillin-tazobactam (ZOSYN)  IV  2.25 g Intravenous Q8H  . polyethylene glycol  17 g Oral Daily  . pregabalin  75 mg Oral Daily  . senna  1 tablet Oral Daily  . sevelamer  2,400 mg Oral TID WC  . warfarin  7.5 mg Oral ONCE-1800    BMET  Lab 07/27/11 0900 07/25/11 0405 07/24/11 0400 07/23/11 1108 07/22/11 0728  NA 137 133* 135 138 138  K 3.8 4.4 4.1 3.9 5.0  CL 96 93* 97 98 101  CO2 29 28 29 30 28   GLUCOSE 111* 130* 84 104* 131*  BUN 54* 63* 44* 30* 52*  CREATININE 7.83* 8.56* 6.85* 5.28* 7.55*  ALB -- -- -- -- --  CALCIUM 9.3 9.4 9.5 9.6 9.2  PHOS 3.9 2.9 -- -- --   CBC  Lab 07/27/11 0900 07/25/11 0405 07/24/11 0400 07/23/11 1108  WBC 7.4 7.7 11.8* 14.5*  NEUTROABS -- -- -- 10.6*  HGB 8.4* 9.0* 9.0* 9.6*  HCT 27.0* 28.7* 28.1* 30.2*  MCV 86.5 85.9 86.5 86.8  PLT 196 194 172 181   Blood Culture    Component Value Date/Time   SDES BLOOD RIGHT ARM 07/23/2011 1600   SPECREQUEST BOTTLES DRAWN AEROBIC AND ANAEROBIC 10CC  07/23/2011 1600   CULT        BLOOD CULTURE RECEIVED NO GROWTH TO DATE CULTURE WILL BE HELD FOR 5 DAYS BEFORE ISSUING A FINAL NEGATIVE REPORT 07/23/2011 1600   REPTSTATUS PENDING 07/23/2011 1600      Assessment/Plan:  1.AMS/Possible viral syndrome: on empiric zosyn for possible HCAP, but no convincing evidence of PNA. May be able to transition to PO quinolone or amoxi-clav at discharge. Mental status back to baseline and non focal exam. Final blood cultures negative. Appears that he may be stable for discharge today. 2.ESRD: Continue TTS HD- currently on dialysis. Will do daily eval for emerging needs.  3. Anemia:Low Hemoglobin, on ESA, no overt losses. 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 Nutrition:Await albumin levels, satisfactory oral intake. On nepro supplement.  [name] K.

## 2011-07-29 LAB — CULTURE, BLOOD (ROUTINE X 2)
Culture  Setup Time: 201212162107
Culture: NO GROWTH

## 2011-07-29 LAB — URINE CULTURE
Colony Count: NO GROWTH
Culture  Setup Time: 201212210507

## 2011-07-31 NOTE — Progress Notes (Signed)
   CARE MANAGEMENT NOTE 07/31/2011  Patient:  [name], [name]   Account Number:  [id number]  Date Initiated:  07/24/2011  Documentation initiated by:  Onnie Boer  Subjective/Objective Assessment:   PT WAS ADMITTED WITH SEPSIS     Action/Plan:   PROGRESSION OF CARE AND DISCHARGE PLANNING   Anticipated DC Date:  07/27/2011   Anticipated DC Plan:  HOME W HOME HEALTH SERVICES      DC Planning Services  CM consult      Choice offered to / List presented to:             Status of service:  Completed, signed off Medicare Important Message given?   (If response is "NO", the following Medicare IM given date fields will be blank) Date Medicare IM given:   Date Additional Medicare IM given:    Discharge Disposition:  HOME/SELF CARE  Per UR Regulation:  Reviewed for med. necessity/level of care/duration of stay  Comments:  07/31/2011 Onnie Boer, RN, BSN 1514 PT WAS DC'D TO HOME WITH SELF CARE  07/25/2011 Onnie Boer, RN, BSN 1523 PT IS BETTER TODAY AND IV ABXS ARE STOPPED.  WILL F/U ON DC NEEDS  UR COMPLETED 07/24/2011 Onnie Boer, RN, BSN 1536 PT WAS ADMITTED WITH SEPSIS.  PTA PT WAS AT HOME WITH A HH RN WITH [facility], WILL F/U ON DC NEEDS.

## 2011-08-02 ENCOUNTER — Encounter: Payer: Self-pay | Admitting: Internal Medicine

## 2011-08-02 LAB — CULTURE, BLOOD (ROUTINE X 2)
Culture  Setup Time: 201212202206
Culture: NO GROWTH

## 2011-08-15 ENCOUNTER — Encounter: Payer: Medicare Other | Attending: Physical Medicine & Rehabilitation | Admitting: Physical Medicine & Rehabilitation

## 2011-09-20 ENCOUNTER — Encounter: Payer: Medicare Other | Attending: Physical Medicine & Rehabilitation | Admitting: Physical Medicine & Rehabilitation

## 2011-09-20 DIAGNOSIS — E1142 Type 2 diabetes mellitus with diabetic polyneuropathy: Secondary | ICD-10-CM | POA: Insufficient documentation

## 2011-09-20 DIAGNOSIS — I69959 Hemiplegia and hemiparesis following unspecified cerebrovascular disease affecting unspecified side: Secondary | ICD-10-CM | POA: Insufficient documentation

## 2011-09-20 DIAGNOSIS — N186 End stage renal disease: Secondary | ICD-10-CM | POA: Insufficient documentation

## 2011-09-20 DIAGNOSIS — I6330 Cerebral infarction due to thrombosis of unspecified cerebral artery: Secondary | ICD-10-CM

## 2011-09-20 DIAGNOSIS — G8110 Spastic hemiplegia affecting unspecified side: Secondary | ICD-10-CM

## 2011-09-20 DIAGNOSIS — M4712 Other spondylosis with myelopathy, cervical region: Secondary | ICD-10-CM

## 2011-09-20 DIAGNOSIS — G8250 Quadriplegia, unspecified: Secondary | ICD-10-CM | POA: Insufficient documentation

## 2011-09-20 DIAGNOSIS — E1149 Type 2 diabetes mellitus with other diabetic neurological complication: Secondary | ICD-10-CM | POA: Insufficient documentation

## 2011-09-21 NOTE — Assessment & Plan Note (Signed)
[name] is back regarding his cervical myelopathy and prior right brain stroke.  He is at a nursing home previously, but now is home with his daughter.  He is walking with his walker around the house.  His pain is 3/10 and really likely less than that.  He does report some knee buckling at times.  He is moving his bowels and bladder but does have occasional constipation.  He still reports some numbness in his hands and feet.  He needs a new shoe as his left one particularly has worn.  REVIEW OF SYSTEMS:  Notable for the above.  Full 12-point review is in the written health and history section of the chart.  SOCIAL HISTORY:  Unchanged.  He is divorced, living with his daughter who is with him today.  PHYSICAL EXAMINATION:  VITAL SIGNS:  Blood pressure is 148/67, pulse 64, respiratory rate 16, and he is saturating 99% on room air.  GENERAL: The patient is pleasant and generally alert. MUSCULOSKELETAL:  Strength in the upper extremities is near 4-5/5.  He is a bit weaker at the hand intrinsics, perhaps at 3+/5.  Left side is a bit weaker than the right.  Left lower extremity is notable for 2/5 strength at the hip and 2+ to 3 at the knee with flexion and extension, and 2+ with ankle dorsiflexion, 3 with ankle plantar flexion.  Right lower extremity is grossly 4/5.  He has diminished sensory function in stocking-glove distribution bilaterally.  Reflexes are 1+.  No resting tone is seen on either side.  He walked for me today and tends to stand with the right knee bent.  He does have a platform buildup on the right shoe.  Left shoe had toebox and sole worn down due to continuous scuffing of the foot.  Swinging the left leg through he tends to drag the toes on the floor very prominently.  He is able to hold his weight in standing without the walker and can transfer from sit to stand with no assistance. NEUROLOGIC:  Cognitively, he is alert and appropriate. HEART:  Regular. CHEST:   Clear.  ASSESSMENT: 1. History of cervical myelopathy with quadriparesis. 2. 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 3. Diabetic peripheral neuropathy. 4. End-stage renal disease.  PLAN: 1. I will send the patient to an orthotist for new diabetic shoes and     modifications to his bracing system.  He may do better with some     dorsiflexion assist on the left side.  Also need to remove any     platform from the right side as well.  After he has received his     bracing, we can pursue outpatient PT. 2. I will see him back in 3 months.  Honestly, he has made nice     progress overall.     Ranelle Oyster, M.D. Electronically Signed    ZTS/MedQ D:  09/20/2011 11:07:19  T:  09/21/2011 00:49:35  Job #:  [id number]  cc:   Cain Saupe, MD Fax: [phone]

## 2011-10-23 ENCOUNTER — Encounter: Payer: Self-pay | Admitting: Physical Medicine & Rehabilitation

## 2011-10-31 ENCOUNTER — Encounter: Payer: Self-pay | Admitting: Physical Medicine & Rehabilitation

## 2011-12-15 ENCOUNTER — Encounter: Payer: Medicare Other | Admitting: Physical Medicine & Rehabilitation

## 2012-01-19 ENCOUNTER — Encounter: Payer: Self-pay | Admitting: Physical Medicine & Rehabilitation

## 2012-01-19 ENCOUNTER — Encounter: Payer: Medicare Other | Attending: Physical Medicine & Rehabilitation | Admitting: Physical Medicine & Rehabilitation

## 2012-01-19 VITALS — BP 166/97 | HR 64 | Resp 14 | Ht 70.0 in | Wt 161.0 lb

## 2012-01-19 DIAGNOSIS — G8250 Quadriplegia, unspecified: Secondary | ICD-10-CM | POA: Insufficient documentation

## 2012-01-19 DIAGNOSIS — I6330 Cerebral infarction due to thrombosis of unspecified cerebral artery: Secondary | ICD-10-CM | POA: Insufficient documentation

## 2012-01-19 DIAGNOSIS — E119 Type 2 diabetes mellitus without complications: Secondary | ICD-10-CM

## 2012-01-19 DIAGNOSIS — G959 Disease of spinal cord, unspecified: Secondary | ICD-10-CM | POA: Insufficient documentation

## 2012-01-19 DIAGNOSIS — N186 End stage renal disease: Secondary | ICD-10-CM | POA: Insufficient documentation

## 2012-01-19 DIAGNOSIS — M4712 Other spondylosis with myelopathy, cervical region: Secondary | ICD-10-CM | POA: Insufficient documentation

## 2012-01-19 DIAGNOSIS — E1142 Type 2 diabetes mellitus with diabetic polyneuropathy: Secondary | ICD-10-CM | POA: Insufficient documentation

## 2012-01-19 DIAGNOSIS — Z8673 Personal history of transient ischemic attack (TIA), and cerebral infarction without residual deficits: Secondary | ICD-10-CM | POA: Insufficient documentation

## 2012-01-19 DIAGNOSIS — E1149 Type 2 diabetes mellitus with other diabetic neurological complication: Secondary | ICD-10-CM | POA: Insufficient documentation

## 2012-01-19 NOTE — Patient Instructions (Signed)
Continue to work on your form with your gait.

## 2012-01-19 NOTE — Progress Notes (Signed)
Subjective:    Patient ID: [name], male    DOB: 08-26-1940, 71 y.o.   MRN: [id number]  HPI  [name] is back regarding his cervical myelopathy. He is feeling well. He is walking frequently around the house. He feels that he's getting stronger. HD continues and he's been adjusting better regarding his intake and tolerating the HD itself. His walker is used for ambulation in the house. He uses a wheelchair outside the house.  He wants to get a walker to use outside.   From a bowel standpoint, he's having a regular movement QD or QOD. His continence has improved.   Pain is not a big problem at this point. His left shoulder does act up from time to time.  He received his diabetic shoes.  He continues with the prior AFO system which is a double upright. It has no dorsiflexion assist. He usually walks in the home but without his shoes/AFO's.   Pain Inventory Average Pain 0 Pain Right Now 0 My pain is intermittent  In the last 24 hours, has pain interfered with the following? General activity 0 Relation with others 0 Enjoyment of life 0 What TIME of day is your pain at its worst? n/a Sleep (in general) Fair  Pain is worse with: n/a Pain improves with: n/a Relief from Meds: 0  Mobility walk with assistance use a walker how many minutes can you walk? 10 ability to climb steps?  no do you drive?  no Do you have any goals in this area?  yes  Function retired I need assistance with the following:  meal prep and shopping Do you have any goals in this area?  yes  Neuro/Psych No problems in this area  Prior Studies Any changes since last visit?  no  Physicians involved in your care Any changes since last visit?  no   History reviewed. No pertinent family history. History   Social History  . Marital Status: Divorced    Spouse Name: N/A    Number of Children: N/A  . Years of Education: N/A   Social History Main Topics  . Smoking status: Never Smoker   .  Smokeless tobacco: None  . Alcohol Use: No  . Drug Use: No  . Sexually Active: None   Other Topics Concern  . None   Social History Narrative  . None   Past Surgical History  Procedure Date  . Foot surgery   . Radiofrequency ablation kidney   . Neck surgery    Past Medical History  Diagnosis Date  . End stage renal disease     on hemodialysis  . Cervical stenosis of spinal canal     s/p C3-C6 laminectomy with Dr. Venetia Maxon and Dr. Jeral Fruit  . Anemia     chronic disease  . Atrial fibrillation     coumadin followed by SNF, longstanding persistent and probably permanent afib  . Aortic stenosis     Echo 02/12/11: moderate aortic stenosis, mean gradient 21,  . Hyperparathyroidism, secondary   . Diabetes mellitus   . Seizure disorder   . Neuropathy     diabetec  . Hypertension   . Hyperkalemia   . BPH (benign prostatic hypertrophy)   . Gout   . History of stroke   . Renal carcinoma     followed at [facility]; s/p gamma knife surgery  . Carotid bruit     Doppler 7/12: Plaque without significant ICA stenosis  . Bradycardia     asymptomatic  .  Atrial enlargement, bilateral    BP 166/97  Pulse 64  Resp 14  Ht 5\' 10"  (1.778 m)  Wt 161 lb (73.029 kg)  BMI 23.10 kg/m2  SpO2 99%     Review of Systems  All other systems reviewed and are negative.       Objective:   Physical Exam  Constitutional: He is oriented to person, place, and time. He appears well-developed and well-nourished.  HENT:  Head: Normocephalic and atraumatic.  Right Ear: External ear normal.  Left Ear: External ear normal.  Nose: Nose normal.  Mouth/Throat: Oropharynx is clear and moist.  Eyes: Conjunctivae and EOM are normal. Pupils are equal, round, and reactive to light.  Cardiovascular: Normal rate and regular rhythm.   Pulmonary/Chest: Effort normal. No respiratory distress. He has no wheezes.  Abdominal: Soft.  Neurological: He is alert and oriented to person, place, and time. No cranial nerve  deficit.       Right lower extremity strength grossly 4-5 out of 5 with normal sensation. Left lower extremity strength is noted to have a 5. His ankle dorsiflexion may have been a bit better on the left side today at 1+ to 2. The sensation was slightly diminished throughout the left the leg and waist today. Reflexes remain 1+ in both lower extremities. His diabetic shoes and left AFO appeared to be fitting appropriately today. Did not walk him as he did not have a walker to use that visit.  Psychiatric: He has a normal mood and affect. His behavior is normal. Judgment and thought content normal.          Assessment & Plan:  :  1. History of cervical myelopathy with quadriparesis.  2. 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  3. Diabetic peripheral neuropathy.  4. End-stage renal disease.  PLAN:  1.I will make a referral to outpatient PT for gait training. I think he has enough ADF that we can avoid making adjustments to his current AFO.   2. All in all he looks very good. He's made nice progress. He'll have to focus on activity modification, better gait technique, safety, etc. 3. I will see him back in 6 months.

## 2012-04-24 ENCOUNTER — Encounter (HOSPITAL_COMMUNITY): Payer: Self-pay | Admitting: *Deleted

## 2012-04-24 ENCOUNTER — Emergency Department (HOSPITAL_COMMUNITY)
Admission: EM | Admit: 2012-04-24 | Discharge: 2012-04-24 | Disposition: A | Discharge status: Home / Self Care | Payer: Medicare Other | Attending: Emergency Medicine | Admitting: Emergency Medicine

## 2012-04-24 ENCOUNTER — Emergency Department (HOSPITAL_COMMUNITY): Payer: Medicare Other

## 2012-04-24 DIAGNOSIS — R079 Chest pain, unspecified: Secondary | ICD-10-CM | POA: Insufficient documentation

## 2012-04-24 DIAGNOSIS — E119 Type 2 diabetes mellitus without complications: Secondary | ICD-10-CM | POA: Insufficient documentation

## 2012-04-24 DIAGNOSIS — Z79899 Other long term (current) drug therapy: Secondary | ICD-10-CM | POA: Insufficient documentation

## 2012-04-24 DIAGNOSIS — Z992 Dependence on renal dialysis: Secondary | ICD-10-CM | POA: Insufficient documentation

## 2012-04-24 DIAGNOSIS — I120 Hypertensive chronic kidney disease with stage 5 chronic kidney disease or end stage renal disease: Secondary | ICD-10-CM | POA: Insufficient documentation

## 2012-04-24 DIAGNOSIS — I4891 Unspecified atrial fibrillation: Secondary | ICD-10-CM | POA: Insufficient documentation

## 2012-04-24 DIAGNOSIS — R42 Dizziness and giddiness: Secondary | ICD-10-CM | POA: Insufficient documentation

## 2012-04-24 DIAGNOSIS — R791 Abnormal coagulation profile: Secondary | ICD-10-CM

## 2012-04-24 DIAGNOSIS — Z8673 Personal history of transient ischemic attack (TIA), and cerebral infarction without residual deficits: Secondary | ICD-10-CM | POA: Insufficient documentation

## 2012-04-24 DIAGNOSIS — E876 Hypokalemia: Secondary | ICD-10-CM | POA: Insufficient documentation

## 2012-04-24 DIAGNOSIS — N186 End stage renal disease: Secondary | ICD-10-CM | POA: Insufficient documentation

## 2012-04-24 LAB — CBC WITH DIFFERENTIAL/PLATELET
Basophils Absolute: 0 10*3/uL (ref 0.0–0.1)
Basophils Relative: 1 % (ref 0–1)
Eosinophils Absolute: 0.2 10*3/uL (ref 0.0–0.7)
Eosinophils Relative: 4 % (ref 0–5)
HCT: 36.9 % — ABNORMAL LOW (ref 39.0–52.0)
Hemoglobin: 11.8 g/dL — ABNORMAL LOW (ref 13.0–17.0)
MCH: 27.6 pg (ref 26.0–34.0)
MCHC: 32 g/dL (ref 30.0–36.0)
MCV: 86.4 fL (ref 78.0–100.0)
Monocytes Absolute: 0.5 10*3/uL (ref 0.1–1.0)
Monocytes Relative: 10 % (ref 3–12)
RDW: 14.5 % (ref 11.5–15.5)

## 2012-04-24 LAB — COMPREHENSIVE METABOLIC PANEL
AST: 22 U/L (ref 0–37)
Albumin: 3.1 g/dL — ABNORMAL LOW (ref 3.5–5.2)
BUN: 23 mg/dL (ref 6–23)
Calcium: 9.2 mg/dL (ref 8.4–10.5)
Chloride: 95 mEq/L — ABNORMAL LOW (ref 96–112)
Creatinine, Ser: 3.83 mg/dL — ABNORMAL HIGH (ref 0.50–1.35)
Total Bilirubin: 0.2 mg/dL — ABNORMAL LOW (ref 0.3–1.2)
Total Protein: 7.3 g/dL (ref 6.0–8.3)

## 2012-04-24 LAB — PROTIME-INR: Prothrombin Time: 18.7 seconds — ABNORMAL HIGH (ref 11.6–15.2)

## 2012-04-24 LAB — TROPONIN I: Troponin I: <0.3 ng/mL (ref <0.30)

## 2012-04-24 LAB — LIPASE, BLOOD: Lipase: 56 U/L (ref 11–59)

## 2012-04-24 MED ORDER — ENOXAPARIN SODIUM 30 MG/0.3ML ~~LOC~~ SOLN
30.0000 mg | Freq: Once | SUBCUTANEOUS | Status: AC
  Administered 2012-04-24: 30 mg via SUBCUTANEOUS
  Filled 2012-04-24 (×2): qty 0.3

## 2012-04-24 MED ORDER — POTASSIUM CHLORIDE CRYS ER 20 MEQ PO TBCR
40.0000 meq | EXTENDED_RELEASE_TABLET | Freq: Once | ORAL | Status: AC
  Administered 2012-04-24: 40 meq via ORAL
  Filled 2012-04-24: qty 2

## 2012-04-24 MED ORDER — ENOXAPARIN SODIUM 60 MG/0.6ML ~~LOC~~ SOLN: 1.0000 mg/kg | Freq: Once | SUBCUTANEOUS | Status: DC

## 2012-04-24 NOTE — ED Provider Notes (Signed)
History     CSN: [id number]  Arrival date & time 04/24/12  1350   First MD Initiated Contact with Patient 04/24/12 1407      Chief Complaint  Patient presents with  . Weakness    (Consider location/radiation/quality/duration/timing/severity/associated sxs/prior treatment) HPI The patient presents with concerns of postdialysis weakness.  He notes that upon completing a full dialysis session, in the hours prior to arrival, the patient felt more weak than usual.  He notes that his weakness was more pronounced when ambulating, though he denies any focal pain, dyspnea, lightheadedness.  Symptoms resolved prior to arrival, and on my exam the patient states that he is "back to normal."   Past Medical History  Diagnosis Date  . End stage renal disease     on hemodialysis  . Cervical stenosis of spinal canal     s/p C3-C6 laminectomy with Dr. Venetia Maxon and Dr. Jeral Fruit  . Anemia     chronic disease  . Atrial fibrillation     coumadin followed by SNF, longstanding persistent and probably permanent afib  . Aortic stenosis     Echo 02/12/11: moderate aortic stenosis, mean gradient 21,  . Hyperparathyroidism, secondary   . Diabetes mellitus   . Seizure disorder   . Neuropathy     diabetec  . Hypertension   . Hyperkalemia   . BPH (benign prostatic hypertrophy)   . Gout   . History of stroke   . Renal carcinoma     followed at [facility]; s/p gamma knife surgery  . Carotid bruit     Doppler 7/12: Plaque without significant ICA stenosis  . Bradycardia     asymptomatic  . Atrial enlargement, bilateral     Past Surgical History  Procedure Date  . Foot surgery   . Radiofrequency ablation kidney   . Neck surgery     No family history on file.  History  Substance Use Topics  . Smoking status: Never Smoker   . Smokeless tobacco: Not on file  . Alcohol Use: No      Review of Systems  Constitutional:       Per HPI, otherwise negative  HENT:       Per HPI, otherwise negative  Eyes:  Negative.   Respiratory:       Per HPI, otherwise negative  Cardiovascular:       Per HPI, otherwise negative  Gastrointestinal: Negative for vomiting.  Genitourinary: Negative.   Musculoskeletal:       Per HPI, otherwise negative  Skin: Negative.   Neurological: Negative for syncope.    Allergies  Review of patient's allergies indicates no known allergies.  Home Medications   Current Outpatient Rx  Name Route Sig Dispense Refill  . ACETAMINOPHEN 325 MG PO TABS Oral Take 650 mg by mouth as needed. Headache pain or fever    . ALLOPURINOL 100 MG PO TABS Oral Take 100 mg by mouth at bedtime.     Marland Kitchen NEPHRO-VITE 0.8 MG PO TABS Oral Take 0.8 mg by mouth at bedtime.      Marland Kitchen BISACODYL 10 MG RE SUPP Rectal Place 10 mg rectally as needed. constipation    . ESCITALOPRAM OXALATE 10 MG PO TABS Oral Take 10 mg by mouth at bedtime.     Marland Kitchen LEVETIRACETAM 250 MG PO TABS Oral Take 250 mg by mouth daily.      Marland Kitchen LEVETIRACETAM 500 MG PO TABS Oral Take 500 mg by mouth at bedtime.     Marland Kitchen NITROGLYCERIN  0.4 MG SL SUBL Sublingual Place 0.4 mg under the tongue every 5 (five) minutes as needed. Chest pain    . POLYETHYLENE GLYCOL 3350 PO PACK Oral Take 17 g by mouth daily. For constipation     . PREGABALIN 75 MG PO CAPS Oral Take 75 mg by mouth daily.     . SENNA 8.6 MG PO TABS Oral Take 1 tablet by mouth daily.      Marland Kitchen SEVELAMER HCL 800 MG PO TABS Oral Take 2,400 mg by mouth 3 (three) times daily with meals.     . TAMSULOSIN HCL 0.4 MG PO CAPS Oral Take 0.4 mg by mouth daily.     . WARFARIN SODIUM 10 MG PO TABS Oral Take 1 tablet (10 mg total) by mouth daily. 30 tablet 0  . WARFARIN SODIUM 2.5 MG PO TABS Oral Take 1 tablet (2.5 mg total) by mouth daily. 30 tablet 0    Take it daily in the evening with 10 mg coumadin d ...    BP 85/54  Pulse 49  Temp 98.7 F (37.1 C) (Oral)  Resp 14  SpO2 98%  Physical Exam  Nursing note and vitals reviewed. Constitutional: He is oriented to person, place, and time. He  appears well-developed. No distress.  HENT:  Head: Normocephalic and atraumatic.  Eyes: Conjunctivae normal and EOM are normal.  Cardiovascular: Normal rate and regular rhythm.   Pulmonary/Chest: Effort normal. No stridor. No respiratory distress.  Abdominal: He exhibits no distension.  Musculoskeletal: He exhibits no edema.       Right arm venous access  Neurological: He is alert and oriented to person, place, and time. No cranial nerve deficit. He exhibits normal muscle tone. Coordination normal.  Skin: Skin is warm and dry.  Psychiatric: He has a normal mood and affect.    ED Course  Procedures (including critical care time)  Labs Reviewed  CBC WITH DIFFERENTIAL - Abnormal; Notable for the following:    Hemoglobin 11.8 (*)     HCT 36.9 (*)     All other components within normal limits  COMPREHENSIVE METABOLIC PANEL - Abnormal; Notable for the following:    Potassium 3.0 (*)     Chloride 95 (*)     Glucose, Bld 137 (*)     Creatinine, Ser 3.83 (*)     Albumin 3.1 (*)     Total Bilirubin 0.2 (*)     GFR calc non Af Amer 15 (*)     GFR calc Af Amer 17 (*)     All other components within normal limits  PROTIME-INR - Abnormal; Notable for the following:    Prothrombin Time 18.7 (*)     INR 1.62 (*)     All other components within normal limits  LIPASE, BLOOD  TROPONIN I   Dg Chest 2 View  04/24/2012  *RADIOLOGY REPORT*  Clinical Data: Chest discomfort, dizziness  CHEST - 2 VIEW  Comparison: Chest radiograph 07/23/2011  Findings: Normal mediastinum and heart silhouette.  Costophrenic angles are clear., effusion, infiltrate, or pneumothorax. Degenerative osteophytosis of the thoracic spine.  IMPRESSION: No acute cardiopulmonary process.   Original Report Authenticated By: Genevive Bi, M.D.      No diagnosis found.  Cardiac: 55 afib, abnormal  Pulse ox 99%ra, normal   Date: 04/24/2012  Rate: 47  Rhythm: atrial fibrillation  QRS Axis: normal  Intervals: afib  ST/T  Wave abnormalities: nonspecific ST/T changes  Conduction Disutrbances:none  Narrative Interpretation:   Old EKG Reviewed:  unchanged ABNORMAL   MDM  The patient presents with concerns of postdialysis weakness.  Notably, by arrival here the patient is asymptomatic, and states that he is back to baseline.  The patient's presentation is most consistent with postdialysis volume equilibrium changes, though given his comorbidities are abnormalities are acute event.  Patient's labs are notable for hypokalemia and a subtherapeutic INR.  Patient received supplements of each of these, was discharged in stable condition with PMD followup.    Gerhard Munch, MD 04/24/12 [phone]

## 2012-04-24 NOTE — ED Notes (Signed)
Pt in [facility] from home, pt c/o weakness post full HD tx today, per report the pts daughter noticed him to be more weak than usual, pt A&O x4, follows commands, speaks in complete sentences, follows commands, pt has HD access in R arm

## 2012-07-15 ENCOUNTER — Ambulatory Visit: Payer: Medicare Other | Admitting: Physical Medicine & Rehabilitation

## 2012-07-23 ENCOUNTER — Encounter: Payer: Medicare Other | Attending: Physical Medicine & Rehabilitation | Admitting: Physical Medicine & Rehabilitation

## 2012-07-23 ENCOUNTER — Encounter: Payer: Self-pay | Admitting: Physical Medicine & Rehabilitation

## 2012-07-23 VITALS — BP 134/79 | HR 68 | Resp 14 | Ht 70.0 in | Wt 176.0 lb

## 2012-07-23 DIAGNOSIS — E1149 Type 2 diabetes mellitus with other diabetic neurological complication: Secondary | ICD-10-CM | POA: Insufficient documentation

## 2012-07-23 DIAGNOSIS — M19049 Primary osteoarthritis, unspecified hand: Secondary | ICD-10-CM

## 2012-07-23 DIAGNOSIS — I6330 Cerebral infarction due to thrombosis of unspecified cerebral artery: Secondary | ICD-10-CM

## 2012-07-23 DIAGNOSIS — E1142 Type 2 diabetes mellitus with diabetic polyneuropathy: Secondary | ICD-10-CM | POA: Insufficient documentation

## 2012-07-23 DIAGNOSIS — G8190 Hemiplegia, unspecified affecting unspecified side: Secondary | ICD-10-CM | POA: Insufficient documentation

## 2012-07-23 DIAGNOSIS — E119 Type 2 diabetes mellitus without complications: Secondary | ICD-10-CM

## 2012-07-23 DIAGNOSIS — M4712 Other spondylosis with myelopathy, cervical region: Secondary | ICD-10-CM

## 2012-07-23 DIAGNOSIS — G959 Disease of spinal cord, unspecified: Secondary | ICD-10-CM

## 2012-07-23 MED ORDER — DICLOFENAC SODIUM 1 % TD GEL: 1.0000 "application " | Freq: Three times a day (TID) | TRANSDERMAL | Status: DC

## 2012-07-23 NOTE — Progress Notes (Addendum)
Subjective:    Patient ID: [name], male    DOB: [birth year], 71 y.o.   MRN: [id number]  HPI  [name] is here in follow up his cervical myelopathy and associated pain. He is living with his daughter and things are going fairly well. He is using his walker. He's had no falls. He has long since completed therapy. He does walking around the house on his own.   He complains of mild pain in his right knee and sometimes his hands.  He takes stool softeners to help regulate his bowels.   Pain Inventory Average Pain 1 Pain Right Now 1 My pain is dull  In the last 24 hours, has pain interfered with the following? General activity 5 Relation with others 5 Enjoyment of life 5 What TIME of day is your pain at its worst? daytime Sleep (in general) Fair  Pain is worse with: walking and some activites Pain improves with: rest and pacing activities Relief from Meds: n/a  Mobility walk with assistance use a walker how many minutes can you walk? 10 ability to climb steps?  yes do you drive?  no Do you have any goals in this area?  yes  Function retired Do you have any goals in this area?  yes  Neuro/Psych bladder control problems bowel control problems weakness numbness trouble walking  Prior Studies Any changes since last visit?  no  Physicians involved in your care Any changes since last visit?  no   Family History  Problem Relation Age of Onset  . Diabetes Mother   . Heart disease Mother   . Hyperlipidemia Mother   . Hypertension Mother   . Diabetes Father   . Hyperlipidemia Father   . Hypertension Father   . Diabetes Sister   . Kidney disease Sister   . Diabetes Brother   . Kidney disease Brother    History   Social History  . Marital Status: Divorced    Spouse Name: N/A    Number of Children: N/A  . Years of Education: N/A   Social History Main Topics  . Smoking status: Never Smoker   . Smokeless tobacco: None  . Alcohol Use: No  . Drug Use:  No  . Sexually Active: None   Other Topics Concern  . None   Social History Narrative  . None   Past Surgical History  Procedure Date  . Foot surgery   . Radiofrequency ablation kidney   . Neck surgery    Past Medical History  Diagnosis Date  . End stage renal disease     on hemodialysis  . Cervical stenosis of spinal canal     s/p C3-C6 laminectomy with Dr. Venetia Maxon and Dr. Jeral Fruit  . Anemia     chronic disease  . Atrial fibrillation     coumadin followed by SNF, longstanding persistent and probably permanent afib  . Aortic stenosis     Echo 02/12/11: moderate aortic stenosis, mean gradient 21,  . Hyperparathyroidism, secondary   . Diabetes mellitus   . Seizure disorder   . Neuropathy     diabetec  . Hypertension   . Hyperkalemia   . BPH (benign prostatic hypertrophy)   . Gout   . History of stroke   . Renal carcinoma     followed at [facility]; s/p gamma knife surgery  . Carotid bruit     Doppler 7/12: Plaque without significant ICA stenosis  . Bradycardia     asymptomatic  . Atrial  enlargement, bilateral    BP 134/79  Pulse 68  Resp 14  Ht 5\' 10"  (1.778 m)  Wt 176 lb (79.833 kg)  BMI 25.25 kg/m2  SpO2 99%     Review of Systems  HENT: Positive for neck pain.   Musculoskeletal: Positive for myalgias, arthralgias and gait problem.  Neurological: Positive for weakness and numbness.  All other systems reviewed and are negative.       Objective:   Physical Exam Constitutional: He is oriented to person, place, and time. He appears well-developed and well-nourished.  HENT:  Head: Normocephalic and atraumatic.  Right Ear: External ear normal.  Left Ear: External ear normal.  Nose: Nose normal.  Mouth/Throat: Oropharynx is clear and moist.  Eyes: Conjunctivae and EOM are normal. Pupils are equal, round, and reactive to light.  Cardiovascular: Normal rate and regular rhythm.  Pulmonary/Chest: Effort normal. No respiratory distress. He has no wheezes.   Abdominal: Soft.  Neurological: He is alert and oriented to person, place, and time. No cranial nerve deficit.  Right lower extremity strength grossly 4-5 out of 5 with normal sensation. Left lower extremity strength is noted to have a 5. His ankle dorsiflexion may have been a bit better on the left side today at 1+ to 2. The sensation was slightly diminished throughout the left the leg and waist today. Reflexes remain 1+ in both lower extremities. His diabetic shoes and left AFO appeared to be fitting appropriately today. Did not walk him as he did not have a walker to use that visit.  Psychiatric: He has a normal mood and affect. His behavior is normal. Judgment and thought content normal.    Assessment & Plan:   :  1. History of cervical myelopathy with quadriparesis.  2. 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  3. Diabetic peripheral neuropathy.  4. End-stage renal disease.   PLAN:  1.Suggested a kick plate for his left shoe if he would like to help with the toes/shoe sticking on the carpet, etc. Wrote an rx for such today 2. All in all he looks very good. He's made nice progress. He'll have to focus on activity modification, better gait technique, safety, etc.  3. I will see him back prn.  4. Gave rx for voltaren gel to be used on hands tid

## 2012-07-23 NOTE — Patient Instructions (Signed)
Call me with questions 

## 2012-07-23 NOTE — Addendum Note (Signed)
Addended by: Faith Rogue T on: 07/23/2012 12:06 PM   Modules accepted: Orders

## 2012-08-26 ENCOUNTER — Other Ambulatory Visit (HOSPITAL_COMMUNITY): Payer: Self-pay | Admitting: Nephrology

## 2012-08-26 DIAGNOSIS — N186 End stage renal disease: Secondary | ICD-10-CM

## 2012-09-02 ENCOUNTER — Encounter (HOSPITAL_COMMUNITY): Payer: Self-pay | Admitting: Pharmacy Technician

## 2012-09-03 ENCOUNTER — Ambulatory Visit (HOSPITAL_COMMUNITY)
Admission: RE | Admit: 2012-09-03 | Discharge: 2012-09-03 | Disposition: A | Discharge status: Home / Self Care | Payer: Medicare Other | Source: Ambulatory Visit | Attending: Nephrology | Admitting: Nephrology

## 2012-09-03 DIAGNOSIS — T82898A Other specified complication of vascular prosthetic devices, implants and grafts, initial encounter: Secondary | ICD-10-CM | POA: Insufficient documentation

## 2012-09-03 DIAGNOSIS — N186 End stage renal disease: Secondary | ICD-10-CM | POA: Insufficient documentation

## 2012-09-03 DIAGNOSIS — Y849 Medical procedure, unspecified as the cause of abnormal reaction of the patient, or of later complication, without mention of misadventure at the time of the procedure: Secondary | ICD-10-CM | POA: Insufficient documentation

## 2012-09-03 MED ORDER — IOHEXOL 300 MG/ML  SOLN
100.0000 mL | Freq: Once | INTRAMUSCULAR | Status: AC | PRN
  Administered 2012-09-03: 40 mL via INTRAVENOUS

## 2012-10-11 ENCOUNTER — Encounter (HOSPITAL_COMMUNITY): Payer: Self-pay | Admitting: Emergency Medicine

## 2012-10-11 ENCOUNTER — Emergency Department (HOSPITAL_COMMUNITY)
Admission: EM | Admit: 2012-10-11 | Discharge: 2012-10-11 | Disposition: A | Discharge status: Home / Self Care | Payer: Medicare Other | Attending: Emergency Medicine | Admitting: Emergency Medicine

## 2012-10-11 ENCOUNTER — Emergency Department (HOSPITAL_COMMUNITY): Payer: Medicare Other

## 2012-10-11 DIAGNOSIS — Z992 Dependence on renal dialysis: Secondary | ICD-10-CM | POA: Insufficient documentation

## 2012-10-11 DIAGNOSIS — N186 End stage renal disease: Secondary | ICD-10-CM | POA: Insufficient documentation

## 2012-10-11 DIAGNOSIS — E119 Type 2 diabetes mellitus without complications: Secondary | ICD-10-CM | POA: Insufficient documentation

## 2012-10-11 DIAGNOSIS — N400 Enlarged prostate without lower urinary tract symptoms: Secondary | ICD-10-CM | POA: Insufficient documentation

## 2012-10-11 DIAGNOSIS — I120 Hypertensive chronic kidney disease with stage 5 chronic kidney disease or end stage renal disease: Secondary | ICD-10-CM | POA: Insufficient documentation

## 2012-10-11 DIAGNOSIS — Z85528 Personal history of other malignant neoplasm of kidney: Secondary | ICD-10-CM | POA: Insufficient documentation

## 2012-10-11 DIAGNOSIS — W1809XA Striking against other object with subsequent fall, initial encounter: Secondary | ICD-10-CM | POA: Insufficient documentation

## 2012-10-11 DIAGNOSIS — E1149 Type 2 diabetes mellitus with other diabetic neurological complication: Secondary | ICD-10-CM | POA: Insufficient documentation

## 2012-10-11 DIAGNOSIS — Y9289 Other specified places as the place of occurrence of the external cause: Secondary | ICD-10-CM | POA: Insufficient documentation

## 2012-10-11 DIAGNOSIS — Y9389 Activity, other specified: Secondary | ICD-10-CM | POA: Insufficient documentation

## 2012-10-11 DIAGNOSIS — Z862 Personal history of diseases of the blood and blood-forming organs and certain disorders involving the immune mechanism: Secondary | ICD-10-CM | POA: Insufficient documentation

## 2012-10-11 DIAGNOSIS — I4891 Unspecified atrial fibrillation: Secondary | ICD-10-CM | POA: Insufficient documentation

## 2012-10-11 DIAGNOSIS — E1142 Type 2 diabetes mellitus with diabetic polyneuropathy: Secondary | ICD-10-CM | POA: Insufficient documentation

## 2012-10-11 DIAGNOSIS — Z7901 Long term (current) use of anticoagulants: Secondary | ICD-10-CM | POA: Insufficient documentation

## 2012-10-11 DIAGNOSIS — D638 Anemia in other chronic diseases classified elsewhere: Secondary | ICD-10-CM | POA: Insufficient documentation

## 2012-10-11 DIAGNOSIS — R001 Bradycardia, unspecified: Secondary | ICD-10-CM

## 2012-10-11 DIAGNOSIS — Z8739 Personal history of other diseases of the musculoskeletal system and connective tissue: Secondary | ICD-10-CM | POA: Insufficient documentation

## 2012-10-11 DIAGNOSIS — G40909 Epilepsy, unspecified, not intractable, without status epilepticus: Secondary | ICD-10-CM | POA: Insufficient documentation

## 2012-10-11 DIAGNOSIS — Z8639 Personal history of other endocrine, nutritional and metabolic disease: Secondary | ICD-10-CM | POA: Insufficient documentation

## 2012-10-11 DIAGNOSIS — Z8679 Personal history of other diseases of the circulatory system: Secondary | ICD-10-CM | POA: Insufficient documentation

## 2012-10-11 DIAGNOSIS — Z79899 Other long term (current) drug therapy: Secondary | ICD-10-CM | POA: Insufficient documentation

## 2012-10-11 DIAGNOSIS — I498 Other specified cardiac arrhythmias: Secondary | ICD-10-CM | POA: Insufficient documentation

## 2012-10-11 DIAGNOSIS — M109 Gout, unspecified: Secondary | ICD-10-CM | POA: Insufficient documentation

## 2012-10-11 DIAGNOSIS — S0990XA Unspecified injury of head, initial encounter: Secondary | ICD-10-CM

## 2012-10-11 DIAGNOSIS — G8929 Other chronic pain: Secondary | ICD-10-CM | POA: Insufficient documentation

## 2012-10-11 LAB — BASIC METABOLIC PANEL
CO2: 30 mEq/L (ref 19–32)
Chloride: 97 mEq/L (ref 96–112)
Creatinine, Ser: 8.95 mg/dL — ABNORMAL HIGH (ref 0.50–1.35)
GFR calc Af Amer: 6 mL/min — ABNORMAL LOW (ref >90)
Potassium: 4 mEq/L (ref 3.5–5.1)

## 2012-10-11 MED ORDER — ACETAMINOPHEN 500 MG PO TABS
1000.0000 mg | ORAL_TABLET | Freq: Four times a day (QID) | ORAL | Status: DC | PRN
  Filled 2012-10-11: qty 2

## 2012-10-11 NOTE — ED Notes (Signed)
BIB [facility]. Fell at dialysis in bathroom this am. Had to be assisted to standing (due to lower extremity weakness). No LOC, numbness/tingling. CBG 94. Right arm restricted. Hx of Afib.

## 2012-10-11 NOTE — ED Provider Notes (Signed)
History     CSN: [id number]  Arrival date & time 10/11/12  1215   None     Chief Complaint  Patient presents with  . Fall    (Consider location/radiation/quality/duration/timing/severity/associated sxs/prior treatment) HPI Comments: 72 year old male who is a dialysis patient who presents from the dialysis center prior to getting his dialysis treatment. He states that he was in the bathroom, because of the weather the power went out causing her to be darkness in the bathroom. When he stood up and tried to turn around he lost his balance falling and striking his head on the ground. He states that he had no loss of consciousness, no numbness, no tingling, minimal headache and no neck pain. 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 His pain is mild, persistent, worse with palpation and not associated with visual changes, hematomas or lacerations. He denies feeling short of breath or having missed any dialysis over the last month.  Patient is a 72 y.o. male presenting with fall. The history is provided by the patient.  Fall    Past Medical History  Diagnosis Date  . End stage renal disease     on hemodialysis  . Cervical stenosis of spinal canal     s/p C3-C6 laminectomy with Dr. Venetia Maxon and Dr. Jeral Fruit  . Anemia     chronic disease  . Atrial fibrillation     coumadin followed by SNF, longstanding persistent and probably permanent afib  . Aortic stenosis     Echo 02/12/11: moderate aortic stenosis, mean gradient 21,  . Hyperparathyroidism, secondary   . Diabetes mellitus   . Seizure disorder   . Neuropathy     diabetec  . Hypertension   . Hyperkalemia   . BPH (benign prostatic hypertrophy)   . Gout   . History of stroke   . Renal carcinoma     followed at [facility]; s/p gamma knife surgery  . Carotid bruit     Doppler 7/12: Plaque without  significant ICA stenosis  . Bradycardia     asymptomatic  . Atrial enlargement, bilateral     Past Surgical History  Procedure Laterality Date  . Foot surgery    . Radiofrequency ablation kidney    . Neck surgery      Family History  Problem Relation Age of Onset  . Diabetes Mother   . Heart disease Mother   . Hyperlipidemia Mother   . Hypertension Mother   . Diabetes Father   . Hyperlipidemia Father   . Hypertension Father   . Diabetes Sister   . Kidney disease Sister   . Diabetes Brother   . Kidney disease Brother     History  Substance Use Topics  . Smoking status: Never Smoker   . Smokeless tobacco: Not on file  . Alcohol Use: No      Review of Systems  All other systems reviewed and are negative.    Allergies  Review of patient's allergies indicates no known allergies.  Home Medications   Current Outpatient Rx  Name  Route  Sig  Dispense  Refill  . allopurinol (ZYLOPRIM) 100 MG tablet   Oral   Take 100 mg by mouth at bedtime.          Marland Kitchen b complex-vitamin c-folic acid (NEPHRO-VITE) 0.8 MG TABS   Oral   Take 0.8  mg by mouth at bedtime.           . diclofenac sodium (VOLTAREN) 1 % GEL   Topical   Apply 1 application topically 3 (three) times daily as needed. For sore muscles.         Marland Kitchen escitalopram (LEXAPRO) 10 MG tablet   Oral   Take 10 mg by mouth at bedtime.          . folic acid-vitamin b complex-vitamin c-selenium-zinc (DIALYVITE) 3 MG TABS   Oral   Take 1 tablet by mouth at bedtime.         . levETIRAcetam (KEPPRA) 500 MG tablet   Oral   Take 250 mg by mouth at bedtime.          . nitroGLYCERIN (NITROSTAT) 0.4 MG SL tablet   Sublingual   Place 0.4 mg under the tongue every 5 (five) minutes as needed. Chest pain         . polyethylene glycol (MIRALAX / GLYCOLAX) packet   Oral   Take 17 g by mouth daily. For constipation          . pregabalin (LYRICA) 75 MG capsule   Oral   Take 75 mg by mouth daily.           Marland Kitchen senna (SENOKOT) 8.6 MG TABS   Oral   Take 1 tablet by mouth daily.           . sevelamer (RENAGEL) 800 MG tablet   Oral   Take 2,400-3,200 mg by mouth 5 (five) times daily. 4 tablets with meals three times daily. 3 tablets twice daily with snacks.         . Tamsulosin HCl (FLOMAX) 0.4 MG CAPS   Oral   Take 0.4 mg by mouth daily after breakfast.          . warfarin (COUMADIN) 5 MG tablet   Oral   Take 7.5-10 mg by mouth See admin instructions. Monday and Friday take two tablets (10mg ). All other days take one and one-half tablets (7.5mg ).           BP 188/66  Pulse 43  Temp(Src) 97.5 F (36.4 C)  Resp 16  SpO2 97%  Physical Exam  Nursing note and vitals reviewed. Constitutional: He appears well-developed and well-nourished. No distress.  HENT:  Head: Normocephalic.  Mouth/Throat: Oropharynx is clear and moist. No oropharyngeal exudate.  Very small area of tenderness to the left posterior occiput, no hematoma, abrasion or laceration  Eyes: Conjunctivae and EOM are normal. Pupils are equal, round, and reactive to light. Right eye exhibits no discharge. Left eye exhibits no discharge. No scleral icterus.  Neck: Normal range of motion. Neck supple. No JVD present. No thyromegaly present.  Cardiovascular: Normal rate, regular rhythm and intact distal pulses.  Exam reveals no gallop and no friction rub.   Murmur (loud systolic murmur) heard. Good thrill in the right forearm graft  Pulmonary/Chest: Effort normal and breath sounds normal. No respiratory distress. He has no wheezes. He has no rales.  Abdominal: Soft. Bowel sounds are normal. He exhibits no distension and no mass. There is no tenderness.  Musculoskeletal: Normal range of motion. He exhibits no edema and no tenderness.  No pain over the posterior neck, no deformity or pain to the extremities x4  Lymphadenopathy:    He has no cervical adenopathy.  Neurological: He is alert. Coordination normal.  Skin: Skin  is warm and dry. No rash noted. No erythema.  Psychiatric: He has a normal mood and affect. His behavior is normal.    ED Course  Procedures (including critical care time)  Labs Reviewed  BASIC METABOLIC PANEL - Abnormal; Notable for the following:    Glucose, Bld 133 (*)    BUN 75 (*)    Creatinine, Ser 8.95 (*)    GFR calc non Af Amer 5 (*)    GFR calc Af Amer 6 (*)    All other components within normal limits   Ct Head Wo Contrast  10/11/2012  *RADIOLOGY REPORT*  Clinical Data: Syncopal episode during hemodialysis.  CT HEAD WITHOUT CONTRAST  Technique:  Contiguous axial images were obtained from the base of the skull through the vertex without contrast.  Comparison: Head CT 07/22/2011.  Findings: There is no evidence of acute intracranial hemorrhage, mass effect, brain edema or extra-axial fluid collection.  The ventricles and subarachnoid spaces are diffusely prominent consistent with mild atrophy.  Mild periventricular white matter disease appears stable.  No acute cortical infarction is seen. There is stable mildly asymmetric internal carotid atherosclerosis.  The visualized paranasal sinuses are clear. The calvarium is intact. There is a stable small exophytic right parietal osteoma.  IMPRESSION: Stable examination.  No acute or reversible intracranial findings identified.   Original Report Authenticated By: Carey Bullocks, M.D.      1. Minor head injury, initial encounter   2. Bradycardia       MDM  The patient suffered a minor head injury, he does have a bradycardia and has a very loud heart murmur that he states that he was aware of. At this time it looks like he is in nature fibrillation, I do not see any rate control medications on his medication list, EKG, check potassium related to need for dialysis but lungs are clear with no obvious pulmonary edema or hypoxia, the patient appears very comfortable. He does carry a diagnosis of aortic stenosis  ED ECG REPORT  I personally  interpreted this EKG   Date: 10/11/2012   Rate: 50  Rhythm: atrial fibrillation  QRS Axis: normal  Intervals: normal  ST/T Wave abnormalities: normal  Conduction Disutrbances:none  Narrative Interpretation:   Old EKG Reviewed: Compared with EKG from 04/24/2012, rate slightly slower, atrial fibrillation persists, no other significant changes   The patient does have a persistent bradycardia between 40 and 55-60 beats per minute. Review of the medical record shows that the patient does have a documented history of asymptomatic bradycardia. His blood pressure has been 145/97, even respirations and the patient does not feel short of breath weak or have any chest pain. He is been able to explain that he does have a bradycardia, he has been told that in the past, he has not had any medications on his list that would cause this and states that he feels comfortable going home to followup. He told dialysis today that he would be there tomorrow and they agreed. The patient appears stable for discharge without hyperkalemia or any pulmonary edema that would require inpatient admission.    Vida Roller, MD 10/11/12 [phone]

## 2013-05-27 ENCOUNTER — Ambulatory Visit (INDEPENDENT_AMBULATORY_CARE_PROVIDER_SITE_OTHER): Payer: Medicare Other

## 2013-05-27 DIAGNOSIS — M21372 Foot drop, left foot: Secondary | ICD-10-CM

## 2013-05-27 DIAGNOSIS — E1149 Type 2 diabetes mellitus with other diabetic neurological complication: Secondary | ICD-10-CM

## 2013-05-27 DIAGNOSIS — E1140 Type 2 diabetes mellitus with diabetic neuropathy, unspecified: Secondary | ICD-10-CM

## 2013-05-27 DIAGNOSIS — Q828 Other specified congenital malformations of skin: Secondary | ICD-10-CM

## 2013-05-27 DIAGNOSIS — L608 Other nail disorders: Secondary | ICD-10-CM

## 2013-05-27 DIAGNOSIS — M216X9 Other acquired deformities of unspecified foot: Secondary | ICD-10-CM

## 2013-05-27 NOTE — Progress Notes (Signed)
  Subjective:    Patient ID: [name], male    DOB: 1940-11-22, 72 y.o.   MRN: [id number] "Trim my toenails and check the athletes feet.  I think it's gotten better." HPI patient has long-standing history of diabetes with peripheral neuropathy. Walks with the assistance of an AFO brace left secondary to dropfoot deformity. Changes medication her health history at this time to    Review of Systems not done at today's visit     Objective:   Physical Exam  Constitutional: He is oriented to person, place, and time. He appears well-developed and well-nourished.  Cardiovascular:  Pulses:      Dorsalis pedis pulses are 1+ on the right side, and 1+ on the left side.       Posterior tibial pulses are 0 on the right side, and 0 on the left side.  Capillary refill time 4 seconds all digits bilateral skin temperature warm turgor normal no edema noted. No varicosities noted bilateral to  Musculoskeletal:  Orthopedic biomechanical exam reveals rectus foot type. Mild bunion deformity bilateral. Mild semirigid digital contractures 2 through 5 with adductovarus rotation. Patient walks with an AFO brace left secondary to dropfoot deformity to history of stroke  Neurological: He is alert and oriented to person, place, and time. He displays abnormal reflex. He exhibits abnormal muscle tone. Coordination abnormal.  Epicritic sensations grossly diminished on Semmes Weinstein testing bilateral. DTRs not listed plantar response abnormal the left side secondary to history of stroke. Patient has a history of dropfoot deformity walks with an AFO brace.  Skin: Skin is warm and dry. No cyanosis. Nails show no clubbing.  Skin color pigment normal hair growth absent bilateral. No open wounds or ulcerations noted current time. Nails thick brittle discolored crumbly, tender on palpation and debridement. Diffuse keratoses sub-hallux IP joint bilateral secondary to abnormality gait and dropfoot deformity.  Psychiatric:  He has a normal mood and affect. His behavior is normal.        Assessment & Plan:  Diabetes with history peripheral neuropathy abnormality gait due to history of stroke. 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 We'll followup with advanced prosthetics as instructed this time dystrophic from criptotic nails debridement presence of diabetes cocking factors. Also debridement multiple keratoses sub-hallux IP joint bilateral. Maintain appropriate shoes. Followup in 3 months for palliative care  Alvan Dame DPM

## 2013-05-27 NOTE — Patient Instructions (Signed)

## 2013-08-26 ENCOUNTER — Ambulatory Visit (INDEPENDENT_AMBULATORY_CARE_PROVIDER_SITE_OTHER): Payer: Medicare Other

## 2013-08-26 VITALS — BP 141/90 | HR 55 | Resp 16

## 2013-08-26 DIAGNOSIS — Q828 Other specified congenital malformations of skin: Secondary | ICD-10-CM

## 2013-08-26 DIAGNOSIS — E1142 Type 2 diabetes mellitus with diabetic polyneuropathy: Secondary | ICD-10-CM

## 2013-08-26 DIAGNOSIS — E1149 Type 2 diabetes mellitus with other diabetic neurological complication: Secondary | ICD-10-CM

## 2013-08-26 DIAGNOSIS — L608 Other nail disorders: Secondary | ICD-10-CM

## 2013-08-26 DIAGNOSIS — E1140 Type 2 diabetes mellitus with diabetic neuropathy, unspecified: Secondary | ICD-10-CM

## 2013-08-26 NOTE — Progress Notes (Signed)
   Subjective:    Patient ID: [name], male    DOB: [birth year], 73 y.o.   MRN: [id number]  HPI Comments: "Cut the toenails and calluses"     Review of Systems no new findings at this time     Objective:   Physical Exam Neurovascular status is intact pedal pulses palpable DP plus one over 4 bilateral PT thready are nonpalpable bilateral. Epicritic and proprioceptive sensations diminished on Semmes Weinstein testing her mild digital contractures with mild bunion deformity associated callus both first MTP area the IP joint areas. No open wounds or ulcers. Tinea has resolved in a stable patient using powder increased preventing recurrence. Nails thick brittle crumbly discolored and friable 1 through 5 bilateral debridement at this time. No open wounds or ulcers are identified. No other changes or any findings skin color texture and turgor normal absent hair growth noted       Assessment & Plan:  Assessment this time is diabetes with peripheral neuropathy. There is difficulty gait abnormality secondary to stroke in neuropathy and dropfoot deformity. Has multiple keratoses which are debrided particular pinch callus first bilateral as well as multiple dystrophic friable mycotic nails and the presence of diabetes and complications. Return for palliative toenail care in 3 months or as needed  Harriet Masson DPM

## 2013-08-26 NOTE — Patient Instructions (Signed)
Diabetes and Foot Care Diabetes may cause you to have problems because of poor blood supply (circulation) to your feet and legs. This may cause the skin on your feet to become thinner, break easier, and heal more slowly. Your skin may become dry, and the skin may peel and crack. You may also have nerve damage in your legs and feet causing decreased feeling in them. You may not notice minor injuries to your feet that could lead to infections or more serious problems. Taking care of your feet is one of the most important things you can do for yourself.  HOME CARE INSTRUCTIONS  Wear shoes at all times, even in the house. Do not go barefoot. Bare feet are easily injured.  Check your feet daily for blisters, cuts, and redness. If you cannot see the bottom of your feet, use a mirror or ask someone for help.  Wash your feet with warm water (do not use hot water) and mild soap. Then pat your feet and the areas between your toes until they are completely dry. Do not soak your feet as this can dry your skin.  Apply a moisturizing lotion or petroleum jelly (that does not contain alcohol and is unscented) to the skin on your feet and to dry, brittle toenails. Do not apply lotion between your toes.  Trim your toenails straight across. Do not dig under them or around the cuticle. File the edges of your nails with an emery board or nail file.  Do not cut corns or calluses or try to remove them with medicine.  Wear clean socks or stockings every day. Make sure they are not too tight. Do not wear knee-high stockings since they may decrease blood flow to your legs.  Wear shoes that fit properly and have enough cushioning. To break in new shoes, wear them for just a few hours a day. This prevents you from injuring your feet. Always look in your shoes before you put them on to be sure there are no objects inside.  Do not cross your legs. This may decrease the blood flow to your feet.  If you find a minor scrape,  cut, or break in the skin on your feet, keep it and the skin around it clean and dry. These areas may be cleansed with mild soap and water. Do not cleanse the area with peroxide, alcohol, or iodine.  When you remove an adhesive bandage, be sure not to damage the skin around it.  If you have a wound, look at it several times a day to make sure it is healing.  Do not use heating pads or hot water bottles. They may burn your skin. If you have lost feeling in your feet or legs, you may not know it is happening until it is too late.  Make sure your health care provider performs a complete foot exam at least annually or more often if you have foot problems. Report any cuts, sores, or bruises to your health care provider immediately. SEEK MEDICAL CARE IF:   You have an injury that is not healing.  You have cuts or breaks in the skin.  You have an ingrown nail.  You notice redness on your legs or feet.  You feel burning or tingling in your legs or feet.  You have pain or cramps in your legs and feet.  Your legs or feet are numb.  Your feet always feel cold. SEEK IMMEDIATE MEDICAL CARE IF:   There is increasing redness,   swelling, or pain in or around a wound.  There is a red line that goes up your leg.  Pus is coming from a wound.  You develop a fever or as directed by your health care provider.  You notice a bad smell coming from an ulcer or wound. Document Released: 07/21/2000 Document Revised: 03/26/2013 Document Reviewed: 12/31/2012 [employer] Patient Information 2014 [employer]  

## 2013-09-08 ENCOUNTER — Other Ambulatory Visit (HOSPITAL_COMMUNITY): Payer: Self-pay | Admitting: Nephrology

## 2013-09-08 ENCOUNTER — Ambulatory Visit (HOSPITAL_COMMUNITY)
Admission: RE | Admit: 2013-09-08 | Discharge: 2013-09-08 | Disposition: A | Discharge status: Home / Self Care | Payer: Medicare Other | Source: Ambulatory Visit | Attending: Surgery | Admitting: Surgery

## 2013-09-08 DIAGNOSIS — M7120 Synovial cyst of popliteal space [Baker], unspecified knee: Secondary | ICD-10-CM | POA: Insufficient documentation

## 2013-09-08 DIAGNOSIS — M79669 Pain in unspecified lower leg: Secondary | ICD-10-CM

## 2013-09-08 DIAGNOSIS — M79609 Pain in unspecified limb: Secondary | ICD-10-CM

## 2013-11-16 ENCOUNTER — Encounter: Payer: Self-pay | Admitting: *Deleted

## 2013-11-25 ENCOUNTER — Ambulatory Visit (INDEPENDENT_AMBULATORY_CARE_PROVIDER_SITE_OTHER): Payer: Medicare Other

## 2013-11-25 VITALS — BP 152/56 | HR 44 | Resp 16

## 2013-11-25 DIAGNOSIS — L608 Other nail disorders: Secondary | ICD-10-CM

## 2013-11-25 DIAGNOSIS — E1140 Type 2 diabetes mellitus with diabetic neuropathy, unspecified: Secondary | ICD-10-CM

## 2013-11-25 DIAGNOSIS — Q828 Other specified congenital malformations of skin: Secondary | ICD-10-CM

## 2013-11-25 DIAGNOSIS — E1149 Type 2 diabetes mellitus with other diabetic neurological complication: Secondary | ICD-10-CM

## 2013-11-25 NOTE — Progress Notes (Signed)
   Subjective:    Patient ID: [name], male    DOB: [birth year], 73 y.o.   MRN: [id number]  HPI Comments: "Cut the toenails and calluses"  Calluses:  1st toes bilateral     Review of Systems no significant systemic changes or findings are noted     Objective:   Physical Exam 73 year old [place] American male presents this time for followup diabetic foot and nail care has intact pedal pulses DP plus one over 4 bilateral PT thready nonpalpable bilateral epicritic sensations diminished on Semmes Weinstein testing to forefoot digits slight slight maceration first webspace left with history with Candee Furbish paint maintain dryness use of over-the-counter antifungal as indicated such as connecting order Lotrimin. Patient does have mild bunion hammertoe deformity semirigid contractures noted open wounds or ulcerations there is pinch callus of the hallux right with hemorrhage keratoses being noted the IP joint is also is thick dystrophic friable gratified thick nails with discoloration and tenderness 1 through 5 bilateral. No open wounds ulcerations noted this time patient walks with uses a walker an AFO brace due to dropfoot neuropathy situations       Assessment & Plan:  Assessment this time his diabetes with peripheral neuropathy this time debridement is still keratotic lesion right great toe hallux IP joint also debridement multiple dystrophic probably orthotic nails 1 through 5 bilateral return for future palliative nail care in 3 months or as needed for followup again treated the first interspace with Candee Furbish paint also keep it clean and dry Betadine or topical antifungal as needed  Harriet Masson DPM

## 2013-11-25 NOTE — Patient Instructions (Signed)
Diabetes and Foot Care Diabetes may cause you to have problems because of poor blood supply (circulation) to your feet and legs. This may cause the skin on your feet to become thinner, break easier, and heal more slowly. Your skin may become dry, and the skin may peel and crack. You may also have nerve damage in your legs and feet causing decreased feeling in them. You may not notice minor injuries to your feet that could lead to infections or more serious problems. Taking care of your feet is one of the most important things you can do for yourself.  HOME CARE INSTRUCTIONS  Wear shoes at all times, even in the house. Do not go barefoot. Bare feet are easily injured.  Check your feet daily for blisters, cuts, and redness. If you cannot see the bottom of your feet, use a mirror or ask someone for help.  Wash your feet with warm water (do not use hot water) and mild soap. Then pat your feet and the areas between your toes until they are completely dry. Do not soak your feet as this can dry your skin.  Apply a moisturizing lotion or petroleum jelly (that does not contain alcohol and is unscented) to the skin on your feet and to dry, brittle toenails. Do not apply lotion between your toes.  Trim your toenails straight across. Do not dig under them or around the cuticle. File the edges of your nails with an emery board or nail file.  Do not cut corns or calluses or try to remove them with medicine.  Wear clean socks or stockings every day. Make sure they are not too tight. Do not wear knee-high stockings since they may decrease blood flow to your legs.  Wear shoes that fit properly and have enough cushioning. To break in new shoes, wear them for just a few hours a day. This prevents you from injuring your feet. Always look in your shoes before you put them on to be sure there are no objects inside.  Do not cross your legs. This may decrease the blood flow to your feet.  If you find a minor scrape,  cut, or break in the skin on your feet, keep it and the skin around it clean and dry. These areas may be cleansed with mild soap and water. Do not cleanse the area with peroxide, alcohol, or iodine.  When you remove an adhesive bandage, be sure not to damage the skin around it.  If you have a wound, look at it several times a day to make sure it is healing.  Do not use heating pads or hot water bottles. They may burn your skin. If you have lost feeling in your feet or legs, you may not know it is happening until it is too late.  Make sure your health care provider performs a complete foot exam at least annually or more often if you have foot problems. Report any cuts, sores, or bruises to your health care provider immediately. SEEK MEDICAL CARE IF:   You have an injury that is not healing.  You have cuts or breaks in the skin.  You have an ingrown nail.  You notice redness on your legs or feet.  You feel burning or tingling in your legs or feet.  You have pain or cramps in your legs and feet.  Your legs or feet are numb.  Your feet always feel cold. SEEK IMMEDIATE MEDICAL CARE IF:   There is increasing redness,   swelling, or pain in or around a wound.  There is a red line that goes up your leg.  Pus is coming from a wound.  You develop a fever or as directed by your health care provider.  You notice a bad smell coming from an ulcer or wound. Document Released: 07/21/2000 Document Revised: 03/26/2013 Document Reviewed: 12/31/2012 [employer] Patient Information 2014 [employer]  

## 2013-12-08 ENCOUNTER — Emergency Department (HOSPITAL_COMMUNITY)
Admission: EM | Admit: 2013-12-08 | Discharge: 2013-12-08 | Disposition: A | Discharge status: Home / Self Care | Payer: Medicare Other | Attending: Emergency Medicine | Admitting: Emergency Medicine

## 2013-12-08 ENCOUNTER — Encounter (HOSPITAL_COMMUNITY): Payer: Self-pay | Admitting: Emergency Medicine

## 2013-12-08 DIAGNOSIS — I120 Hypertensive chronic kidney disease with stage 5 chronic kidney disease or end stage renal disease: Secondary | ICD-10-CM | POA: Insufficient documentation

## 2013-12-08 DIAGNOSIS — G589 Mononeuropathy, unspecified: Secondary | ICD-10-CM | POA: Insufficient documentation

## 2013-12-08 DIAGNOSIS — Z8553 Personal history of malignant neoplasm of renal pelvis: Secondary | ICD-10-CM | POA: Insufficient documentation

## 2013-12-08 DIAGNOSIS — M109 Gout, unspecified: Secondary | ICD-10-CM | POA: Insufficient documentation

## 2013-12-08 DIAGNOSIS — I498 Other specified cardiac arrhythmias: Secondary | ICD-10-CM | POA: Insufficient documentation

## 2013-12-08 DIAGNOSIS — M6281 Muscle weakness (generalized): Secondary | ICD-10-CM | POA: Insufficient documentation

## 2013-12-08 DIAGNOSIS — R531 Weakness: Secondary | ICD-10-CM

## 2013-12-08 DIAGNOSIS — E119 Type 2 diabetes mellitus without complications: Secondary | ICD-10-CM | POA: Insufficient documentation

## 2013-12-08 DIAGNOSIS — N2581 Secondary hyperparathyroidism of renal origin: Secondary | ICD-10-CM | POA: Insufficient documentation

## 2013-12-08 DIAGNOSIS — I517 Cardiomegaly: Secondary | ICD-10-CM | POA: Insufficient documentation

## 2013-12-08 DIAGNOSIS — R0989 Other specified symptoms and signs involving the circulatory and respiratory systems: Secondary | ICD-10-CM | POA: Insufficient documentation

## 2013-12-08 DIAGNOSIS — Z992 Dependence on renal dialysis: Secondary | ICD-10-CM | POA: Insufficient documentation

## 2013-12-08 DIAGNOSIS — Z8673 Personal history of transient ischemic attack (TIA), and cerebral infarction without residual deficits: Secondary | ICD-10-CM | POA: Insufficient documentation

## 2013-12-08 DIAGNOSIS — I359 Nonrheumatic aortic valve disorder, unspecified: Secondary | ICD-10-CM | POA: Insufficient documentation

## 2013-12-08 DIAGNOSIS — I4891 Unspecified atrial fibrillation: Secondary | ICD-10-CM | POA: Insufficient documentation

## 2013-12-08 DIAGNOSIS — Z79899 Other long term (current) drug therapy: Secondary | ICD-10-CM | POA: Insufficient documentation

## 2013-12-08 DIAGNOSIS — Z7901 Long term (current) use of anticoagulants: Secondary | ICD-10-CM | POA: Insufficient documentation

## 2013-12-08 DIAGNOSIS — G40909 Epilepsy, unspecified, not intractable, without status epilepticus: Secondary | ICD-10-CM | POA: Insufficient documentation

## 2013-12-08 DIAGNOSIS — N186 End stage renal disease: Secondary | ICD-10-CM | POA: Insufficient documentation

## 2013-12-08 DIAGNOSIS — E875 Hyperkalemia: Secondary | ICD-10-CM | POA: Insufficient documentation

## 2013-12-08 DIAGNOSIS — M4802 Spinal stenosis, cervical region: Secondary | ICD-10-CM | POA: Insufficient documentation

## 2013-12-08 NOTE — ED Notes (Addendum)
Pt remains at nurse's station, and is unable to contact family.

## 2013-12-08 NOTE — ED Provider Notes (Signed)
CSN: [id number]     Arrival date & time 12/08/13  1500 History   First MD Initiated Contact with Patient 12/08/13 1514     Chief Complaint  Patient presents with  . Dizziness     (Consider location/radiation/quality/duration/timing/severity/associated sxs/prior Treatment) HPI Comments: Patient completed his usual dialysis session this morning 4 hours, finishing at 12:30 PM. Patient reports usually he is quite tired after his session, goes home and sleeps. He reports his family was concerned about him following dialysis and therefore recommended that he be evaluated in the emergency department. Patient currently reports he feels well, close to his baseline other than being sleepy. He denies headache, dizziness, chest pain, back pain, shortness of breath. He has a history of hypertension, atrial fibrillation, stroke affecting his left side of his body. He denies any new numbness or weakness of his extremities. Patient reports that he did eat some lunch during dialysis. He does admit he is somewhat hungry currently. He also has a history of a seizure, denies seizing nor any confusion. Currently there is no family here at bedside.  The history is provided by the patient and the EMS personnel.    Past Medical History  Diagnosis Date  . End stage renal disease     on hemodialysis  . Cervical stenosis of spinal canal     s/p C3-C6 laminectomy with Dr. Vertell Limber and Dr. Joya Salm  . Anemia     chronic disease  . Atrial fibrillation     coumadin followed by SNF, longstanding persistent and probably permanent afib  . Aortic stenosis     Echo 02/12/11: moderate aortic stenosis, mean gradient 21,  . Hyperparathyroidism, secondary   . Diabetes mellitus   . Seizure disorder   . Neuropathy     diabetec  . Hypertension   . Hyperkalemia   . BPH (benign prostatic hypertrophy)   . Gout   . History of stroke   . Renal carcinoma     followed at [facility]; s/p gamma knife surgery  . Carotid bruit     Doppler  7/12: Plaque without significant ICA stenosis  . Bradycardia     asymptomatic  . Atrial enlargement, bilateral    Past Surgical History  Procedure Laterality Date  . Foot surgery    . Radiofrequency ablation kidney    . Neck surgery    . Blood clot Bilateral    Family History  Problem Relation Age of Onset  . Diabetes Mother   . Heart disease Mother   . Hyperlipidemia Mother   . Hypertension Mother   . Diabetes Father   . Hyperlipidemia Father   . Hypertension Father   . Diabetes Sister   . Kidney disease Sister   . Diabetes Brother   . Kidney disease Brother    History  Substance Use Topics  . Smoking status: Never Smoker   . Smokeless tobacco: Not on file  . Alcohol Use: No    Review of Systems  Constitutional: Negative for fever.  Respiratory: Negative for cough and chest tightness.   Cardiovascular: Negative for chest pain.  Gastrointestinal: Negative for nausea, vomiting and abdominal pain.  Neurological: Positive for weakness. Negative for syncope and headaches.  All other systems reviewed and are negative.     Allergies  Review of patient's allergies indicates no known allergies.  Home Medications   Prior to Admission medications   Medication Sig Start Date End Date Taking? Authorizing Provider  allopurinol (ZYLOPRIM) 100 MG tablet Take 100 mg by  mouth at bedtime.     Historical Provider, MD  b complex-vitamin c-folic acid (NEPHRO-VITE) 0.8 MG TABS Take 0.8 mg by mouth at bedtime.      Historical Provider, MD  diclofenac sodium (VOLTAREN) 1 % GEL Apply 1 application topically 3 (three) times daily as needed. For sore muscles. 07/23/12   Meredith Staggers, MD  escitalopram (LEXAPRO) 10 MG tablet Take 10 mg by mouth at bedtime.     Historical Provider, MD  folic acid-vitamin b complex-vitamin c-selenium-zinc (DIALYVITE) 3 MG TABS Take 1 tablet by mouth at bedtime.    Historical Provider, MD  levETIRAcetam (KEPPRA) 500 MG tablet Take 250 mg by mouth at  bedtime.     Historical Provider, MD  nitroGLYCERIN (NITROSTAT) 0.4 MG SL tablet Place 0.4 mg under the tongue every 5 (five) minutes as needed. Chest pain    Historical Provider, MD  polyethylene glycol (MIRALAX / GLYCOLAX) packet Take 17 g by mouth daily. For constipation     Historical Provider, MD  pregabalin (LYRICA) 75 MG capsule Take 75 mg by mouth daily.     Historical Provider, MD  senna (SENOKOT) 8.6 MG TABS Take 1 tablet by mouth daily.      Historical Provider, MD  sevelamer (RENAGEL) 800 MG tablet Take 2,400-3,200 mg by mouth 5 (five) times daily. 4 tablets with meals three times daily. 3 tablets twice daily with snacks.    Historical Provider, MD  Tamsulosin HCl (FLOMAX) 0.4 MG CAPS Take 0.4 mg by mouth daily after breakfast.     Historical Provider, MD  warfarin (COUMADIN) 5 MG tablet Take 7.5-10 mg by mouth See admin instructions. Monday and Friday take two tablets (10mg ). All other days take one and one-half tablets (7.5mg ).    Historical Provider, MD   BP 119/59  Pulse 46  Temp(Src) 98.2 F (36.8 C) (Oral)  Resp 19  SpO2 100% Physical Exam  Nursing note and vitals reviewed. Constitutional: He appears well-developed and well-nourished. He is cooperative.  Non-toxic appearance. He does not have a sickly appearance. No distress.  HENT:  Head: Normocephalic and atraumatic.  Mouth/Throat: No oropharyngeal exudate.  Eyes: Conjunctivae and EOM are normal. No scleral icterus.  Neck: Normal range of motion. Neck supple.  Cardiovascular: Intact distal pulses.  An irregular rhythm present. Bradycardia present.   Pulmonary/Chest: Effort normal. No respiratory distress. He has no wheezes. He has no rales.  Abdominal: Soft. He exhibits no distension. There is no tenderness.  Musculoskeletal:  Thrill palpable and right upper extremity  Neurological: He is alert.  4+/5 grip strength on left.  Left lid lag of face, otherwise no facial droop, slightly slurred speech.  3/5 strength of LLE   Skin: Skin is warm and dry. No rash noted. He is not diaphoretic.  Psychiatric: He has a normal mood and affect.    ED Course  Procedures (including critical care time) Labs Review Labs Reviewed - No data to display  Imaging Review No results found.   EKG Interpretation At time 15:21, atrial fibrillation at rate of 50, LVH with repolarization abnormality, prolonged QT interval. This is an abnormal EKG.     Room air saturation is 100% I interpret this to be normal  4:51 PM Pt feels much improved.  BP is now 129/61.  Pt is eating a meal easily.    7:14 PM Pt has eaten, able to stand with no symptoms, take some steps in his room, limited due to the fact he doesn't have all of  his shoes.  No family has been here.  Pt would like to go home.  Will d./c home.     MDM   Final diagnoses:  Weakness     Patient with apparent generalized weakness following dialysis. Patient reports he has no pain, nausea, shortness of breath. He reports he often goes to bed following dialysis. He denies any new strokelike symptoms. He denies any chest pain, diaphoresis, nausea. He's been feeling well prior to dialysis today and feels near or at his baseline at present. Plan is to continue to monitor him, weight for family to discuss further history, EKG. Patient has mild weakness of his left upper extremity, left lid lag, baseline weakness of his left lower extremity. I suspect these features are his usual baseline.    Saddie Benders. Dorna Mai, MD 12/08/13 1914

## 2013-12-08 NOTE — ED Notes (Signed)
Pt went to dialysis today and got home started to feel weak and dizzy and sat in chair called 911 and upon ems arrival bp was 74/p and then passed out assisted to floor and bpo came up to 90/palp has 20 left forearm has gotten 200cc fluid hAS shunt rt arm and afib

## 2013-12-08 NOTE — ED Notes (Signed)
[facility] has arrived to transport the pt, however the pt's son in law contacted the ER to say that he will pick up the pt. Pt's family member will call the department when he has arrived.

## 2013-12-08 NOTE — Discharge Instructions (Signed)
Weakness  Weakness is a lack of strength. It may be felt all over the body (generalized) or in one specific part of the body (focal). Some causes of weakness can be serious. You may need further medical evaluation, especially if you are elderly or you have a history of immunosuppression (such as chemotherapy or HIV), kidney disease, heart disease, or diabetes.  CAUSES   Weakness can be caused by many different things, including:  · Infection.  · Physical exhaustion.  · Internal bleeding or other blood loss that results in a lack of red blood cells (anemia).  · Dehydration. This cause is more common in elderly people.  · Side effects or electrolyte abnormalities from medicines, such as pain medicines or sedatives.  · Emotional distress, anxiety, or depression.  · Circulation problems, especially severe peripheral arterial disease.  · Heart disease, such as rapid atrial fibrillation, bradycardia, or heart failure.  · Nervous system disorders, such as Guillain-Barré syndrome, multiple sclerosis, or stroke.  DIAGNOSIS   To find the cause of your weakness, your caregiver will take your history and perform a physical exam. Lab tests or X-rays may also be ordered, if needed.  TREATMENT   Treatment of weakness depends on the cause of your symptoms and can vary greatly.  HOME CARE INSTRUCTIONS   · Rest as needed.  · Eat a well-balanced diet.  · Try to get some exercise every day.  · Only take over-the-counter or prescription medicines as directed by your caregiver.  SEEK MEDICAL CARE IF:   · Your weakness seems to be getting worse or spreads to other parts of your body.  · You develop new aches or pains.  SEEK IMMEDIATE MEDICAL CARE IF:   · You cannot perform your normal daily activities, such as getting dressed and feeding yourself.  · You cannot walk up and down stairs, or you feel exhausted when you do so.  · You have shortness of breath or chest pain.  · You have difficulty moving parts of your body.  · You have weakness  in only one area of the body or on only one side of the body.  · You have a fever.  · You have trouble speaking or swallowing.  · You cannot control your bladder or bowel movements.  · You have black or bloody vomit or stools.  MAKE SURE YOU:  · Understand these instructions.  · Will watch your condition.  · Will get help right away if you are not doing well or get worse.  Document Released: 07/24/2005 Document Revised: 01/23/2012 Document Reviewed: 09/22/2011  ExitCare® Patient Information ©2014 [employer].

## 2013-12-08 NOTE — ED Notes (Signed)
This RN paged [facility] to take the pt home.

## 2013-12-08 NOTE — ED Notes (Signed)
Pt sitting at the nurse's station trying to reach someone for a ride home.

## 2014-02-17 ENCOUNTER — Encounter: Payer: Self-pay | Admitting: Interventional Cardiology

## 2014-02-24 ENCOUNTER — Ambulatory Visit: Payer: Medicare Other

## 2014-02-25 ENCOUNTER — Ambulatory Visit: Payer: Medicare Other | Admitting: Podiatrist

## 2014-03-03 ENCOUNTER — Ambulatory Visit: Payer: Medicare Other

## 2014-03-12 ENCOUNTER — Ambulatory Visit: Payer: Medicare Other | Admitting: Interventional Cardiology

## 2014-03-26 ENCOUNTER — Encounter: Payer: Self-pay | Admitting: Podiatry

## 2014-03-26 ENCOUNTER — Ambulatory Visit (INDEPENDENT_AMBULATORY_CARE_PROVIDER_SITE_OTHER): Payer: Medicare Other | Admitting: Podiatry

## 2014-03-26 VITALS — BP 152/86 | HR 72 | Resp 12

## 2014-03-26 DIAGNOSIS — M79676 Pain in unspecified toe(s): Secondary | ICD-10-CM

## 2014-03-26 DIAGNOSIS — L608 Other nail disorders: Secondary | ICD-10-CM

## 2014-03-26 DIAGNOSIS — Q828 Other specified congenital malformations of skin: Secondary | ICD-10-CM

## 2014-03-26 DIAGNOSIS — M79609 Pain in unspecified limb: Secondary | ICD-10-CM

## 2014-03-26 DIAGNOSIS — E1140 Type 2 diabetes mellitus with diabetic neuropathy, unspecified: Secondary | ICD-10-CM

## 2014-03-26 DIAGNOSIS — E1142 Type 2 diabetes mellitus with diabetic polyneuropathy: Secondary | ICD-10-CM

## 2014-03-26 DIAGNOSIS — E1149 Type 2 diabetes mellitus with other diabetic neurological complication: Secondary | ICD-10-CM

## 2014-03-26 NOTE — Patient Instructions (Signed)
Diabetes and Foot Care Diabetes may cause you to have problems because of poor blood supply (circulation) to your feet and legs. This may cause the skin on your feet to become thinner, break easier, and heal more slowly. Your skin may become dry, and the skin may peel and crack. You may also have nerve damage in your legs and feet causing decreased feeling in them. You may not notice minor injuries to your feet that could lead to infections or more serious problems. Taking care of your feet is one of the most important things you can do for yourself.  HOME CARE INSTRUCTIONS  Wear shoes at all times, even in the house. Do not go barefoot. Bare feet are easily injured.  Check your feet daily for blisters, cuts, and redness. If you cannot see the bottom of your feet, use a mirror or ask someone for help.  Wash your feet with warm water (do not use hot water) and mild soap. Then pat your feet and the areas between your toes until they are completely dry. Do not soak your feet as this can dry your skin.  Apply a moisturizing lotion or petroleum jelly (that does not contain alcohol and is unscented) to the skin on your feet and to dry, brittle toenails. Do not apply lotion between your toes.  Trim your toenails straight across. Do not dig under them or around the cuticle. File the edges of your nails with an emery board or nail file.  Do not cut corns or calluses or try to remove them with medicine.  Wear clean socks or stockings every day. Make sure they are not too tight. Do not wear knee-high stockings since they may decrease blood flow to your legs.  Wear shoes that fit properly and have enough cushioning. To break in new shoes, wear them for just a few hours a day. This prevents you from injuring your feet. Always look in your shoes before you put them on to be sure there are no objects inside.  Do not cross your legs. This may decrease the blood flow to your feet.  If you find a minor scrape,  cut, or break in the skin on your feet, keep it and the skin around it clean and dry. These areas may be cleansed with mild soap and water. Do not cleanse the area with peroxide, alcohol, or iodine.  When you remove an adhesive bandage, be sure not to damage the skin around it.  If you have a wound, look at it several times a day to make sure it is healing.  Do not use heating pads or hot water bottles. They may burn your skin. If you have lost feeling in your feet or legs, you may not know it is happening until it is too late.  Make sure your health care provider performs a complete foot exam at least annually or more often if you have foot problems. Report any cuts, sores, or bruises to your health care provider immediately. SEEK MEDICAL CARE IF:   You have an injury that is not healing.  You have cuts or breaks in the skin.  You have an ingrown nail.  You notice redness on your legs or feet.  You feel burning or tingling in your legs or feet.  You have pain or cramps in your legs and feet.  Your legs or feet are numb.  Your feet always feel cold. SEEK IMMEDIATE MEDICAL CARE IF:   There is increasing redness,   swelling, or pain in or around a wound.  There is a red line that goes up your leg.  Pus is coming from a wound.  You develop a fever or as directed by your health care provider.  You notice a bad smell coming from an ulcer or wound. Document Released: 07/21/2000 Document Revised: 03/26/2013 Document Reviewed: 12/31/2012 ExitCare Patient Information 2015 [employer]. This information is not intended to replace advice given to you by your health care provider. Make sure you discuss any questions you have with your health care provider.  

## 2014-03-26 NOTE — Progress Notes (Signed)
Subjective:     Patient ID: ISIAHA GREENUP, male   DOB: 04-24-1941, 73 y.o.   MRN: [id number]  HPI 73 year old male returns the office today with painful elongated nails and calluses bilaterally. Patient has a history diabetes and he states that his sugars usually run in the 90s. Also has complaints of dry skin the top of the left foot. Denies any recent ulcerations. No further complaints. Review of Systems  All other systems reviewed and are negative.      Objective:   Physical Exam AAO x3, NAD DP/PT pulses palpable, decreased PT pulses b/l.  Protective sensation with slight decrease with a Semmes Weinstein monofilament Hypertrophic, brittle, yellow brownish discoloration, elongated nails x10 Bilateral decrease in medial arch height with mild bunion and hammertoe deformities. Hyperkeratotic lesions bilateral medial hallux. Xerosis dorsal aspect left foot. No interdigital maceration b/l. No open lesions.    Assessment:     73 year old male with history of diabetes and peripheral neuropathy presents for elongated nails, calluses    Plan:     Discussed proper foot care and gait inspection. Nail sharply debrided K27 without complications. Hyperkeratotic lesions sharply debrided x2 without complication. Recommended moisturizer dry skin on the dorsal aspect of the left foot however cannot applied to the interspaces. Continue with drying of the interspaces to help prevent maceration. Followup in 3 months or sooner if any problems are to arise

## 2014-04-23 ENCOUNTER — Ambulatory Visit: Payer: Medicare Other | Admitting: Interventional Cardiology

## 2014-05-12 ENCOUNTER — Ambulatory Visit: Payer: Medicare Other | Admitting: Interventional Cardiology

## 2014-05-21 ENCOUNTER — Ambulatory Visit (INDEPENDENT_AMBULATORY_CARE_PROVIDER_SITE_OTHER): Payer: Medicare Other | Admitting: Interventional Cardiology

## 2014-05-21 ENCOUNTER — Encounter: Payer: Self-pay | Admitting: Interventional Cardiology

## 2014-05-21 VITALS — BP 166/76 | HR 63 | Ht 69.0 in | Wt 156.0 lb

## 2014-05-21 DIAGNOSIS — R001 Bradycardia, unspecified: Secondary | ICD-10-CM

## 2014-05-21 DIAGNOSIS — I10 Essential (primary) hypertension: Secondary | ICD-10-CM

## 2014-05-21 DIAGNOSIS — I482 Chronic atrial fibrillation, unspecified: Secondary | ICD-10-CM

## 2014-05-21 DIAGNOSIS — I350 Nonrheumatic aortic (valve) stenosis: Secondary | ICD-10-CM

## 2014-05-21 NOTE — Patient Instructions (Addendum)
Your physician has requested that you regularly monitor and record your blood pressure readings at home. Please use the same machine at the same time of day to check your readings and record them on NON Dialysis days.   Your physician recommends that you continue on your current medications as directed. Please refer to the Current Medication list given to you today.  Your physician wants you to follow-up in: 1 year with Dr. Irish Lack. You will receive a reminder letter in the mail two months in advance. If you don't receive a letter, please call our office to schedule the follow-up appointment.

## 2014-05-21 NOTE — Progress Notes (Signed)
Patient ID: [name], male   DOB: 1941-04-04, 73 y.o.   MRN: [id number]    [address] [place], [place]  [address] Phone: [phone] Fax:  [phone]  Date:  05/21/2014   ID:  [name], DOB 15-Sep-1940, MRN [id number]  PCP:  Antony Blackbird, MD      History of Present Illness: [name] is a 73 y.o. male  who has ESRD and was seen here for bradycardia, AFib. Holter was done and showed SB during sleep. Walking is limited by knee pain/weakness. He has had some edema. He does not elevate legs at night.  No CP or SHOB with the exertion he can do.  He has had slow flow rates with dialysis, but this resolved after intervention on his fistula. He has had some aortic stenosis in the past. He has bilateral leg pain. He does not walk much. Pain is worse in the morning and while lying at dialysis in the lounge chair. Not related to walking in a store, which is the most exercise that he does. Low BP readings at the end of dialysis have resolved.  SOmetimes, he has high readings before dialysis which resolve with dialysis. BP on nondialysis days was in the 120/70 range in the past, but not recently checked.   He has unintentionally lost weight as well.  He was denied eligibility for a transplant.  No bleeding problems on Coumadin.      Wt Readings from Last 3 Encounters:  05/21/14 156 lb (70.761 kg)  07/23/12 176 lb (79.833 kg)  04/24/12 161 lb (73.029 kg)     Past Medical History  Diagnosis Date  . End stage renal disease     on hemodialysis  . Cervical stenosis of spinal canal     s/p C3-C6 laminectomy with Dr. Vertell Limber and Dr. Joya Salm  . Anemia     chronic disease  . Atrial fibrillation     coumadin followed by SNF, longstanding persistent and probably permanent afib  . Aortic stenosis     Echo 02/12/11: moderate aortic stenosis, mean gradient 21,  . Hyperparathyroidism, secondary   . Diabetes mellitus   . Seizure disorder   . Neuropathy     diabetec  .  Hypertension   . Hyperkalemia   . BPH (benign prostatic hypertrophy)   . Gout   . History of stroke   . Renal carcinoma     followed at [facility]; s/p gamma knife surgery  . Carotid bruit     Doppler 7/12: Plaque without significant ICA stenosis  . Bradycardia     asymptomatic  . Atrial enlargement, bilateral     Current Outpatient Prescriptions  Medication Sig Dispense Refill  . allopurinol (ZYLOPRIM) 100 MG tablet Take 100 mg by mouth at bedtime.       Marland Kitchen b complex-vitamin c-folic acid (NEPHRO-VITE) 0.8 MG TABS Take 0.8 mg by mouth at bedtime.        . folic acid-vitamin b complex-vitamin c-selenium-zinc (DIALYVITE) 3 MG TABS Take 1 tablet by mouth at bedtime.      . levETIRAcetam (KEPPRA) 500 MG tablet Take 250 mg by mouth at bedtime.       . nitroGLYCERIN (NITROSTAT) 0.4 MG SL tablet Place 0.4 mg under the tongue every 5 (five) minutes as needed. Chest pain      . polyethylene glycol (MIRALAX / GLYCOLAX) packet Take 17 g by mouth daily. For constipation       . pregabalin (  LYRICA) 75 MG capsule Take 75 mg by mouth daily.       Marland Kitchen RENVELA 800 MG tablet Take 800 mg by mouth daily.      Marland Kitchen senna (SENOKOT) 8.6 MG TABS Take 1 tablet by mouth daily.        . SENSIPAR 30 MG tablet Take 60 mg by mouth daily.       . sevelamer (RENAGEL) 800 MG tablet Take 2,400-3,200 mg by mouth 5 (five) times daily. 4 tablets with meals three times daily. 3 tablets twice daily with snacks.      . Tamsulosin HCl (FLOMAX) 0.4 MG CAPS Take 0.4 mg by mouth daily after breakfast.       . warfarin (COUMADIN) 5 MG tablet Take 7.5-10 mg by mouth daily. Monday and Friday take two tablets (10mg ). All other days take one and one-half tablets (7.5mg ).      . escitalopram (LEXAPRO) 10 MG tablet Take 10 mg by mouth at bedtime.        No current facility-administered medications for this visit.    Allergies:   No Known Allergies  Social History:  The patient  reports that he has never smoked. He does not have any smokeless  tobacco history on file. He reports that he does not drink alcohol or use illicit drugs.   Family History:  The patient's family history includes Diabetes in his brother, father, mother, and sister; Heart disease in his mother; Hyperlipidemia in his father and mother; Hypertension in his father and mother; Kidney disease in his brother and sister.   ROS:  Please see the history of present illness.  No nausea, vomiting.  No fevers, chills.  No focal weakness.  No dysuria.    All other systems reviewed and negative.   PHYSICAL EXAM: VS:  BP 166/76  Pulse 63  Ht 5\' 9"  (1.753 m)  Wt 156 lb (70.761 kg)  BMI 23.03 kg/m2 Well nourished, well developed, in no acute distress HEENT: normal Neck: no JVD, no carotid bruits Cardiac:  normal S1, S2; irregularly irregular, normal rate Lungs:  clear to auscultation bilaterally, no wheezing, rhonchi or rales Abd: soft, nontender, no hepatomegaly Ext: no edema Skin: warm and dry Neuro:   no focal abnormalities noted, slow gait      ASSESSMENT AND PLAN:  Atrial fibrillation  Notes: AFib appears to be permanent. Coumadin for stroke prevention. Rate controlled. No palpitaitons.    2. Aortic stenosis  Notes: Aortic stenosis has progressed in 2/ 2014. No CP, SHOB or syncope. No Sx of severe AS. COntinue to monitor. Explained indications for AVR and additional risks given ESRD.  No repeat echo unless sx occur.     3. Sick sinus syndrome  Notes: He had some bradycardia on a monitor during sleep in 2011. no recent symptoms of severe bradycardia.    4.  HTN: Recheck in the office today showed BP of 137/74.  Watch at home.  If BP increases, would consider adding nonrate slowing BP med, perhaps only on nondialysis days.  Signed, Mina Marble, MD, [facility] 05/21/2014 8:32 AM

## 2014-06-16 ENCOUNTER — Ambulatory Visit: Payer: Medicare Other | Attending: Nephrology

## 2014-06-16 DIAGNOSIS — N186 End stage renal disease: Secondary | ICD-10-CM | POA: Diagnosis not present

## 2014-06-16 DIAGNOSIS — R531 Weakness: Secondary | ICD-10-CM | POA: Insufficient documentation

## 2014-06-16 DIAGNOSIS — Z5189 Encounter for other specified aftercare: Secondary | ICD-10-CM | POA: Insufficient documentation

## 2014-06-16 DIAGNOSIS — I69898 Other sequelae of other cerebrovascular disease: Secondary | ICD-10-CM | POA: Diagnosis not present

## 2014-06-16 DIAGNOSIS — IMO0002 Reserved for concepts with insufficient information to code with codable children: Secondary | ICD-10-CM

## 2014-06-16 DIAGNOSIS — E119 Type 2 diabetes mellitus without complications: Secondary | ICD-10-CM | POA: Insufficient documentation

## 2014-06-16 DIAGNOSIS — R5381 Other malaise: Secondary | ICD-10-CM | POA: Diagnosis not present

## 2014-06-16 DIAGNOSIS — M479 Spondylosis, unspecified: Secondary | ICD-10-CM | POA: Diagnosis not present

## 2014-06-16 DIAGNOSIS — R269 Unspecified abnormalities of gait and mobility: Secondary | ICD-10-CM | POA: Diagnosis not present

## 2014-06-16 NOTE — Therapy (Signed)
Physical Therapy Evaluation  Patient Details  Name: [name] MRN: [id number] Date of Birth: 09-Oct-1940  Encounter Date: 06/16/2014      PT End of Session - 06/16/14 1623    Visit Number 1   Number of Visits 17   Authorization Type G-code required every 10th visit for Medicare.   PT Start Time 1145   PT Stop Time 1231   PT Time Calculation (min) 46 min   Equipment Utilized During Treatment Gait belt   Activity Tolerance Patient tolerated treatment well   Behavior During Therapy WFL for tasks assessed/performed      Past Medical History  Diagnosis Date  . End stage renal disease     on hemodialysis  . Cervical stenosis of spinal canal     s/p C3-C6 laminectomy with Dr. Vertell Limber and Dr. Joya Salm  . Anemia     chronic disease  . Atrial fibrillation     coumadin followed by SNF, longstanding persistent and probably permanent afib  . Aortic stenosis     Echo 02/12/11: moderate aortic stenosis, mean gradient 21,  . Hyperparathyroidism, secondary   . Diabetes mellitus   . Seizure disorder   . Neuropathy     diabetec  . Hypertension   . Hyperkalemia   . BPH (benign prostatic hypertrophy)   . Gout   . History of stroke   . Renal carcinoma     followed at [facility]; s/p gamma knife surgery  . Carotid bruit     Doppler 7/12: Plaque without significant ICA stenosis  . Bradycardia     asymptomatic  . Atrial enlargement, bilateral     Past Surgical History  Procedure Laterality Date  . Foot surgery    . Radiofrequency ablation kidney    . Neck surgery    . Blood clot Bilateral     There were no vitals taken for this visit.  Visit Diagnosis:  Abnormality of gait - Plan: PT plan of care cert/re-cert  Weakness due to cerebrovascular accident - Plan: PT plan of care cert/re-cert  Debility - Plan: PT plan of care cert/re-cert      Subjective Assessment - 06/16/14 1156    Symptoms Falls during ambulation, impaired balance, and weakness.   Patient Stated Goals To get  physically stronger, not fall, and walk longer distances.   Currently in Pain? No/denies          [facility] PT Assessment - 06/16/14 1145    Precautions   Precautions Fall   Restrictions   Weight Bearing Restrictions No   Other Position/Activity Restrictions Driving restrictions   Balance Screen   Has the patient fallen in the past 6 months Yes   How many times? 3   Has the patient had a decrease in activity level because of a fear of falling?  Yes   Is the patient reluctant to leave their home because of a fear of falling?  Yes   Home Environment   Living Enviornment Private residence   [place]  lives with daughter and her family [name]).   Available Help at Discharge Family   Type of [place] to enter   Entrance Stairs-Number of Steps 3   Entrance Stairs-Rails None   Home Layout One level   [place] - 4 wheels;Shower seat;Hospital bed  2 rollators   Prior Function   Level of Independence Requires assistive device for independence   Cognition   Overall Cognitive Status Within Functional  Limits for tasks assessed   Observation/Other Assessments   Observations PT and pt noted dry/darkened skin starting 2 inches proximal to [name] ankle. Pt reported MD is aware. PT also noted [name] double metal upright AFO medial rod is curved inward towards pt leg, PT encouraged pt to show DME provider in order to get it fixed, pt verbalized understanding and agreement.   Skin Integrity see note above.   Other Surveys  Select   Activities of Balance Confidence Scale (ABC Scale)  20.6%   Sensation   Light Touch Appears Intact   Additional Comments Pt has N/T in fingers and [name] LE during dialysis.   Coordination   Gross Motor Movements are Fluid and Coordinated No   Fine Motor Movements are Fluid and Coordinated No   Coordination and Movement Description [name] LE gross and fine movements are slow and impaired which could be due to weakness vs.  coordination.   Finger Nose Finger Test Finger to thumbs WFL; with [name] UE experiencing slight decr. in speed vs. R  UE.   Posture/Postural Control   Posture/Postural Control Postural limitations   Postural Limitations Rounded Shoulders;Forward head;Increased thoracic kyphosis;Flexed trunk  pt has history of Cervical vertebrae fractures (2010).   Posture Comments Pt also experiences [name] LE ER standing and during ambulation.   AROM   Overall AROM  Deficits   Overall AROM Comments B UE AROM WFL. [name] LE limited in all directions due to weakness, see strength.   Strength   Overall Strength Deficits   Overall Strength Comments B UE and strength WFL.    Right Hip Flexion 3+/5   Right Hip ABduction --   Right Hip ADduction --   Left Hip Flexion 2/5   Right Knee Flexion 4/5   Right Knee Extension 3+/5   Left Knee Flexion 2/5   Left Knee Extension 2/5   Right Ankle Dorsiflexion 3+/5   Left Ankle Dorsiflexion 2/5   Transfers   Transfers Sit to Stand;Stand to Sit   Sit to Stand 4: Min guard;With upper extremity assist;With armrests;From chair/3-in-1  to ensure safety and with rollator.   Stand to Sit 4: Min guard;With upper extremity assist;With armrests;To chair/3-in-1  to ensure safety and with rollator.   Ambulation/Gait   Ambulation/Gait Yes   Ambulation/Gait Assistance 4: Min assist;4: Min guard   Ambulation/Gait Assistance Details CGA during amb., except for Min A required twice to maintain balance while performing turns. Pt noted to experience B knee flex. occasionally during amb. and while attempting turns, VCs and demo to correct knee flex. Pt required frequent rest breaks during ambulation due to fatigue.   Ambulation Distance (Feet) --  40', 20', 40, 30'.   Assistive device 4-wheeled walker   Gait Pattern Step-to pattern;Decreased step length - right;Decreased stance time - left;Decreased dorsiflexion - right;Decreased dorsiflexion - left;Left circumduction;Right flexed knee in  stance;Left flexed knee in stance;Trendelenburg;Trunk flexed;Left genu recurvatum  occasional B flexed knee in stance, [name] LE ER.   Gait velocity 0.78ft/sec.   Balance   Balance Assessed Yes   Static Standing Balance   Static Standing - Balance Support No upper extremity supported   Static Standing - Level of Assistance 4: Min assist  CGA-Min A   Static Standing Balance -  Activities  --  feet apart with eyes open and closed   Static Standing - Comment/# of Minutes All balance performed with CGA. Min A was required to maintain balance with feet apart and eyes closed after 6 seconds. Pt  able to maintain static standing  balance with feet apart and eyes open for 45 seconds, and for 6 seconds with feet apart and eyes closed.             PT Education - 06/16/14 1622    Education provided Yes   Education Details PT encouraged pt to go to AFO DME provider to have medial rod of [name] double upright AFO repaired.   Person(s) Educated Patient   Methods Explanation   Comprehension Verbalized understanding          PT Short Term Goals - 06/16/14 1628    PT SHORT TERM GOAL #1   Title Pt will be independent in HEP in order to improve functional mobililty.   Time --  07/14/14   Status New   PT SHORT TERM GOAL #2   Title Pt will improve TUG time to </=46 seconds., with LRAD, to decr. falls risk.   Time --  07/14/14   Status New   PT SHORT TERM GOAL #3   Title Pt will improve gait speed to >/= 1.32ft/sec, with LRAD, to improve functional mobility.   Time --  07/14/14   Status New   PT SHORT TERM GOAL #4   Title Pt will ambulate 150' with LRAD, over even terrain, to improve functional mobility.   Time --  07/14/14   Status New   PT SHORT TERM GOAL #5   Title Pt will ascend/descent 4 steps without a handrail, with min A, to improve functional mobility.   Additional Short Term Goals   Additional Short Term Goals Yes   PT SHORT TERM GOAL #6   Title Perform BERG and write STG and LTG.   Time  --  07/14/14   Status New          PT Long Term Goals - 06/16/14 1633    PT LONG TERM GOAL #1   Title Pt will be able to verbalize understanding of falls prevention strategies to reduce falls risk.   Time --  08/21/14   Status New   PT LONG TERM GOAL #2   Title Pt will improve TUG time, with LRAD, to <40seconds to reduce falls risk.   Time --  08/11/14   Status New   PT LONG TERM GOAL #3   Title Pt will improve gait speed >/= 1.98ft/sec. to improve functional mobility.   Time --  08/11/14   Status New   PT LONG TERM GOAL #4   Title Pt will ambulate 300', with LRAD, over even/uneven terrain, at MOD I level to improve functional mobility.   Time --  08/11/14   Status New   PT LONG TERM GOAL #5   Title Pt will ascend/descend 4 step withou a handrail, with CGA to improve functional mobility.   Time --  08/11/14   Status New   Additional Long Term Goals   Additional Long Term Goals Yes   PT LONG TERM GOAL #6   Title Pt's ABC score will improve by 10% to improve quality of life.   Baseline ABC: 20.6%   Time --  08/11/14   Status New          Plan - 06/16/14 1624    Clinical Impression Statement Pt is a pleasant 73 y/o male presenting to OPPT neuro with history of falls. Pt had a CVA in 1999, and has residual [name] sided weakness. Pt reported 3 falls in the last 6 months, and has decreased activity level due  to fear of falling.  Pt reported he is able to perform all ADLs at MOD I level. Pt is currently able to ambulate approx. 21' before requiring a rest break, but did report B LEs occasionally "give out" without warning. Pt would benefit from skilled therapy to improve functional mobility.   Pt will benefit from skilled therapeutic intervention in order to improve on the following deficits Abnormal gait;Decreased activity tolerance;Decreased endurance;Decreased coordination;Decreased range of motion;Impaired flexibility;Decreased knowledge of use of DME;Decreased balance;Decreased  mobility;Decreased strength   Rehab Potential Good   PT Frequency 2x / week   PT Duration 8 weeks   PT Treatment/Interventions ADLs/Self Care Home Management;Gait training;Neuromuscular re-education;Stair training;Functional mobility training;Patient/family education;Therapeutic activities;Therapeutic exercise;Manual techniques;DME Instruction;Balance training   PT Next Visit Plan Perform BERG, initiate balance/strengthening HEP.   Consulted and Agree with Plan of Care Patient          G-Codes - June 22, 2014 1637    Functional Assessment Tool Used TUG with rollator: 53.97sec.; gait speed with rollator: 0.77ft/sec.   Functional Limitation Mobility: Walking and moving around   Mobility: Walking and Moving Around Current Status [phone]) At least 60 percent but less than 80 percent impaired, limited or restricted   Mobility: Walking and Moving Around Goal Status [phone]) At least 20 percent but less than 40 percent impaired, limited or restricted      Problem List Patient Active Problem List   Diagnosis Date Noted  . Osteoarthritis, hand 07/23/2012  . Cervical myelopathy 01/19/2012  . Cerebral thrombosis with cerebral infarction 01/19/2012  . Hypotension 07/23/2011  . Sepsis 07/23/2011  . Bradycardia 04/07/2011  . End stage renal disease   . Cervical stenosis of spinal canal   . Atrial fibrillation   . Aortic stenosis   . Diabetes mellitus   . Renal carcinoma   . Carotid bruit   . Hypertension                                               [name] June 22, 2014, 4:41 PM

## 2014-06-17 ENCOUNTER — Encounter: Payer: Self-pay | Admitting: *Deleted

## 2014-06-18 ENCOUNTER — Ambulatory Visit (INDEPENDENT_AMBULATORY_CARE_PROVIDER_SITE_OTHER): Payer: Medicare Other | Admitting: Neurology

## 2014-06-18 ENCOUNTER — Encounter: Payer: Self-pay | Admitting: Neurology

## 2014-06-18 ENCOUNTER — Ambulatory Visit: Payer: Medicare Other | Admitting: Neurology

## 2014-06-18 VITALS — BP 163/74 | HR 52 | Ht 69.0 in | Wt 161.4 lb

## 2014-06-18 DIAGNOSIS — I69854 Hemiplegia and hemiparesis following other cerebrovascular disease affecting left non-dominant side: Secondary | ICD-10-CM

## 2014-06-18 DIAGNOSIS — R569 Unspecified convulsions: Secondary | ICD-10-CM

## 2014-06-18 DIAGNOSIS — T6701XA Heatstroke and sunstroke, initial encounter: Secondary | ICD-10-CM

## 2014-06-18 DIAGNOSIS — I69354 Hemiplegia and hemiparesis following cerebral infarction affecting left non-dominant side: Secondary | ICD-10-CM

## 2014-06-18 DIAGNOSIS — R531 Weakness: Secondary | ICD-10-CM | POA: Insufficient documentation

## 2014-06-18 NOTE — Progress Notes (Signed)
[facility]    Provider:  Dr Jaynee [facility] Referring Provider: Antony Blackbird, MD Primary Care Physician:  Antony Blackbird, MD  CC:  Stroke  HPI:  [name] is a 73 y.o. male here as a referral from Dr. Chapman Fitch for stroke and weakness. PMHx of diabetes, ESRD, afib and DVT on Warfarin, HTN, peripheral neuropathy, aortic stenosis and anemia. He has a history of CVA and resultant left hemiparesis. He is here to establish care. He is a lovely patient. When asked how things are he laughs: Things are "terrible, I want to run!". He is trying to deal with it. Follows with [facility]. He is on Warfarin for afib. He is on Keppra for resultant seizures after his stroke. He hasn't had a seizure in 7 years, well controlled. Stroke was in 1999. Uses a walker. 3 weeks ago he slipped in the bathtub "I miscalculated and fell". He is not on a cholesterol medication, he doesn' t if his cholesterol is normal. He is following with PT soon. Denies any new neurologic symptoms.   Reviewed notes, labs and imaging from outside physicians, which showed: He is on dialysis. He has neuropathy and takes Lyrica.HgbA1c 5.1. Hepatic panel unremarkable.   Review of Systems: Patient complains of symptoms per HPI as well as the following symptoms: weakness, snoring, urination problems. No CP, no palpitations, no SOB. Pertinent negatives per HPI. All others negative.   History   Social History  . Marital Status: Divorced    Spouse Name: N/A    Number of Children: 2  . Years of Education: 12+   Occupational History  . Retired     Social History Main Topics  . Smoking status: Never Smoker   . Smokeless tobacco: Never Used  . Alcohol Use: No     Comment: Quit 20 years ago  . Drug Use: No  . Sexual Activity: Not on file   Other Topics Concern  . Not on file   Social History Narrative   Patient is unemployed.    Patient is a retired Pharmacist, hospital   Patient is divorced.    Patient has 2 children.    Patient lives with his daughter     Family History  Problem Relation Age of Onset  . Diabetes Mother   . Heart disease Mother   . Hyperlipidemia Mother   . Hypertension Mother   . Diabetes Father   . Hyperlipidemia Father   . Hypertension Father   . Diabetes Sister   . Kidney disease Sister   . Diabetes Brother   . Kidney disease Brother     Past Medical History  Diagnosis Date  . End stage renal disease     on hemodialysis  . Cervical stenosis of spinal canal     s/p C3-C6 laminectomy with Dr. Vertell Limber and Dr. Joya Salm  . Anemia     chronic disease  . Atrial fibrillation     coumadin followed by SNF, longstanding persistent and probably permanent afib  . Aortic stenosis     Echo 02/12/11: moderate aortic stenosis, mean gradient 21,  . Hyperparathyroidism, secondary   . Diabetes mellitus   . Seizure disorder   . Neuropathy     diabetec  . Hypertension   . Hyperkalemia   . BPH (benign prostatic hypertrophy)   . Gout   . History of stroke   . Renal carcinoma     followed at [facility]; s/p gamma knife surgery  . Carotid bruit  Doppler 7/12: Plaque without significant ICA stenosis  . Bradycardia     asymptomatic  . Atrial enlargement, bilateral     Past Surgical History  Procedure Laterality Date  . Foot surgery    . Radiofrequency ablation kidney    . Neck surgery    . Blood clot Bilateral     Current Outpatient Prescriptions  Medication Sig Dispense Refill  . allopurinol (ZYLOPRIM) 100 MG tablet Take 100 mg by mouth at bedtime.     . B Complex-C-Folic Acid (DIALYVITE TABLET) TABS     . b complex-vitamin c-folic acid (NEPHRO-VITE) 0.8 MG TABS Take 0.8 mg by mouth at bedtime.      Marland Kitchen escitalopram (LEXAPRO) 10 MG tablet Take 10 mg by mouth at bedtime.     . folic acid-vitamin b complex-vitamin c-selenium-zinc (DIALYVITE) 3 MG TABS Take 1 tablet by mouth at bedtime.    . levETIRAcetam (KEPPRA) 500 MG tablet Take 250 mg by mouth at bedtime.     . nitroGLYCERIN  (NITROSTAT) 0.4 MG SL tablet Place 0.4 mg under the tongue every 5 (five) minutes as needed. Chest pain    . polyethylene glycol (MIRALAX / GLYCOLAX) packet Take 17 g by mouth daily. For constipation     . pregabalin (LYRICA) 75 MG capsule Take 75 mg by mouth daily.     Marland Kitchen RENVELA 800 MG tablet Take 800 mg by mouth daily. 5 TABS DAILY    . senna (SENOKOT) 8.6 MG TABS Take 1 tablet by mouth daily.      . SENSIPAR 30 MG tablet Take 60 mg by mouth daily.     . sevelamer (RENAGEL) 800 MG tablet Take 2,400-3,200 mg by mouth 5 (five) times daily. 4 tablets with meals three times daily. 3 tablets twice daily with snacks.    . Tamsulosin HCl (FLOMAX) 0.4 MG CAPS Take 0.4 mg by mouth daily after breakfast.     . warfarin (COUMADIN) 5 MG tablet Take 7.5-10 mg by mouth daily. Monday and Friday take two tablets (10mg ). All other days take one and one-half tablets (7.5mg ).     No current facility-administered medications for this visit.    Allergies as of 06/18/2014  . (No Known Allergies)    Vitals: BP 163/74 mmHg  Pulse 52  Ht 5\' 9"  (1.753 m)  Wt 161 lb 6.4 oz (73.211 kg)  BMI 23.82 kg/m2 Last Weight:  Wt Readings from Last 1 Encounters:  06/18/14 161 lb 6.4 oz (73.211 kg)   Last Height:   Ht Readings from Last 1 Encounters:  06/18/14 5\' 9"  (1.753 m)   Physical exam: Exam: Gen: NAD, conversant                     CV: No Carotid Bruits. No peripheral edema, warm, nontender Eyes: Conjunctivae clear without exudates or hemorrhage  Neuro: Detailed Neurologic Exam  Speech:    Speech is normal; fluent and spontaneous with normal comprehension.  Cognition:    The patient is oriented to person, place, and time;     recent and remote memory intact;     language fluent;     normal attention, concentration,     fund of knowledge Cranial Nerves:    The pupils are equal, round, and reactive to light. The fundi are flat. Visual fields are full to finger confrontation. Extraocular movements are  intact. Trigeminal sensation is intact and the muscles of mastication are normal. The face is symmetric. The palate elevates in the  midline. Voice is normal. Shoulder shrug is normal. The tongue has normal motion without fasciculations.   Coordination:    Normal finger to nose. Difficulty with HTS bilat due to L>R weakness.   Gait:    Slow with a walker. Left leg circumduction  Motor Observation:    Left pronator drift.  no involuntary movements noted. Tone:    Not increased, no spasticity    Posture:    Posture is stooped    Strength: bilateral deltoids 4/5                  Left biceps/triceps 4+/5                  Left hip flexion 0/5                  Right hip flexion 4/5                  Left HS 3/5                   Left foot drop     Otherwise Strength is intact the upper and lower limbs.      Sensation: intact to LT     Reflex Exam:   DTR's:    Absent achilles bilaterally. Otherwise Left > Right ; brisk on the left Toes:    The toes are downgoing bilaterally.   Clonus:    Clonus is absent.       Assessment/Plan:  73 year old male who is here to re-establish care for previous stroke and left-hemiparesis.   Seizures: Continue Keppra. Well controlled Stroke with resultant left hemiparesis leg>arm: Continue to manage vascular risk factors, follow with PCP for tight glucose control, BP management and warfarin administration. Will contact [facility] to see what patient's LDL was and if he should be on cholesterol management. Luckily he does not have increased tone or spasticity. Weakness: Patient is feeling weak. Exam does show some right-sided weakness which is not attributable to the stroke. I'm not sure if this is due to disuse and/or chronic medical conditions or if he has a new myopathic process. He will be starting PT and I want to see if his weakness improves. He is going to follow up with me in 3 months, sooner if needed. I would like him to call if his weakness does  not improve. I wil let his physician at Dr. Chapman Fitch at [place] know as well so she can inform me if she notices progressing weakness. If his weakness continues to progress or does not improve with PT/strengthening will have to do an EMG/NCS and serum workup to evaluate for neuromuscular disorders.  Sarina Ill, MD  [facility] [address] [place], [place] [address]  Phone [phone] Fax [phone]

## 2014-06-18 NOTE — Patient Instructions (Signed)
Overall you are doing fairly well but I do want to suggest a few things today:   Remember to drink plenty of fluid, eat healthy meals and do not skip any meals. Try to eat protein with a every meal and eat a healthy snack such as fruit or nuts in between meals. Try to keep a regular sleep-wake schedule and try to exercise daily, particularly in the form of walking, 20-30 minutes a day, if you can.   As far as your medications are concerned, I would like to suggest: I will check with your PCP about cholesterol   I would like to see you back in 4 months, sooner if we need to. Please call us with any interim questions, concerns, problems, updates or refill requests.   Please also call us for any test results so we can go over those with you on the phone.  My clinical assistant and will answer any of your questions and relay your messages to me and also relay most of my messages to you.   Our phone number is [phone]. We also have an after hours call service for urgent matters and there is a physician on-call for urgent questions. For any emergencies you know to call 911 or go to the nearest emergency room

## 2014-06-25 ENCOUNTER — Ambulatory Visit: Payer: Medicare Other | Admitting: Podiatry

## 2014-06-26 ENCOUNTER — Ambulatory Visit: Payer: Medicare Other | Admitting: Podiatry

## 2014-06-30 ENCOUNTER — Ambulatory Visit: Payer: Medicare Other

## 2014-07-07 ENCOUNTER — Encounter: Payer: Self-pay | Admitting: Rehabilitative and Restorative Service Providers"

## 2014-07-07 ENCOUNTER — Ambulatory Visit: Payer: Medicare Other | Attending: Nephrology | Admitting: Rehabilitative and Restorative Service Providers"

## 2014-07-07 DIAGNOSIS — R269 Unspecified abnormalities of gait and mobility: Secondary | ICD-10-CM | POA: Diagnosis not present

## 2014-07-07 DIAGNOSIS — Z5189 Encounter for other specified aftercare: Secondary | ICD-10-CM | POA: Diagnosis present

## 2014-07-07 DIAGNOSIS — R531 Weakness: Secondary | ICD-10-CM | POA: Diagnosis not present

## 2014-07-07 DIAGNOSIS — M479 Spondylosis, unspecified: Secondary | ICD-10-CM | POA: Diagnosis not present

## 2014-07-07 DIAGNOSIS — N186 End stage renal disease: Secondary | ICD-10-CM | POA: Diagnosis not present

## 2014-07-07 DIAGNOSIS — I69898 Other sequelae of other cerebrovascular disease: Secondary | ICD-10-CM | POA: Diagnosis not present

## 2014-07-07 DIAGNOSIS — E119 Type 2 diabetes mellitus without complications: Secondary | ICD-10-CM | POA: Diagnosis not present

## 2014-07-07 DIAGNOSIS — R5381 Other malaise: Secondary | ICD-10-CM | POA: Insufficient documentation

## 2014-07-07 NOTE — Therapy (Signed)
Physical Therapy Treatment  Patient Details  Name: [name] MRN: [id number] Date of Birth: [birth year]  Encounter Date: 07/07/2014      PT End of Session - 07/07/14 1049    Visit Number 2  G code (2)   Number of Visits 17   Authorization Type G-code required every 10th visit for Medicare.   PT Start Time 1018   PT Stop Time 1100   PT Time Calculation (min) 42 min   Equipment Utilized During Treatment Gait belt   Activity Tolerance Patient tolerated treatment well   Behavior During Therapy WFL for tasks assessed/performed      Past Medical History  Diagnosis Date  . End stage renal disease     on hemodialysis  . Cervical stenosis of spinal canal     s/p C3-C6 laminectomy with Dr. Vertell Limber and Dr. Joya Salm  . Anemia     chronic disease  . Atrial fibrillation     coumadin followed by SNF, longstanding persistent and probably permanent afib  . Aortic stenosis     Echo 02/12/11: moderate aortic stenosis, mean gradient 21,  . Hyperparathyroidism, secondary   . Diabetes mellitus   . Seizure disorder   . Neuropathy     diabetec  . Hypertension   . Hyperkalemia   . BPH (benign prostatic hypertrophy)   . Gout   . History of stroke   . Renal carcinoma     followed at [facility]; s/p gamma knife surgery  . Carotid bruit     Doppler 7/12: Plaque without significant ICA stenosis  . Bradycardia     asymptomatic  . Atrial enlargement, bilateral     Past Surgical History  Procedure Laterality Date  . Foot surgery    . Radiofrequency ablation kidney    . Neck surgery    . Blood clot Bilateral     There were no vitals taken for this visit.  Visit Diagnosis:  Abnormality of gait      Subjective Assessment - 07/07/14 1026    Symptoms Patient backs down stairs vs. descending forward due to weakness and falls.  He has had current AFO since 2005.     Currently in Pain? No/denies     NEUROMUSCULAR RE-EDUCATION:      [facility] PT Assessment - 07/07/14 1027    Standardized  Balance Assessment   Standardized Balance Assessment Berg Balance Test   Berg Balance Test   Sit to Stand Able to stand  independently using hands   Standing Unsupported Able to stand 2 minutes with supervision   Sitting with Back Unsupported but Feet Supported on Floor or Stool Able to sit safely and securely 2 minutes   Stand to Sit Controls descent by using hands   Transfers Able to transfer with verbal cueing and /or supervision   Standing Unsupported with Eyes Closed Able to stand 10 seconds with supervision   Standing Ubsupported with Feet Together Needs help to attain position but able to stand for 30 seconds with feet together   From Standing, Reach Forward with Outstretched Arm Reaches forward but needs supervision   From Standing Position, Pick up Object from Floor Able to pick up shoe, needs supervision   From Standing Position, Turn to Look Behind Over each Shoulder Needs supervision when turning   Turn 360 Degrees Needs assistance while turning   Standing Unsupported, Alternately Place Feet on Step/Stool Needs assistance to keep from falling or unable to try   Standing Unsupported, One Foot in [place]  Needs help to step but can hold 15 seconds   Standing on One Leg Unable to try or needs assist to prevent fall   Total Score 25    BERG=25/56 indicating high fall risk.  THERAPEUTIC EXERCISE: HEP instructed including:  Sit<>stand x 10 repetitions, sidestepping, and mini squats x 10 reps. See HEP.  Gait: The patient ambulates x 75 feet, 115 feet with cues L hip and L knee flexion in order to improve L foot clearance.  The patient leaves L leg posterior and ER during gait.  When he attempted to swing through with L LE, his metal brace on L side hits R heel.  PT to trial lighter brace to encourage more regular swing phase.       PT Education - 07/07/14 1049    Education provided Yes   Education Details HEP: sit<>stand, sidestepping and mini squats   Person(s) Educated Patient    Methods Explanation;Demonstration   Comprehension Verbalized understanding;Returned demonstration          PT Short Term Goals - 07/07/14 1402    Additional Short Term Goals   Additional Short Term Goals Yes   PT SHORT TERM GOAL #6   Title Perform BERG and write STG and LTG.   Baseline Baseline Berg=25/56.   Status Achieved   PT SHORT TERM GOAL #7   Title IMprove Berg score from 25/56 up to 30/56 to demo improved steady state standing balance.   Baseline due 07/14/14   Status New          PT Long Term Goals - 07/07/14 1404    PT LONG TERM GOAL #7   Title Improve Berg score from 25/56 up to 34/56 to demo decreasing risk for falls.   Baseline due 08/11/2014   Status New          Plan - 07/07/14 1035    Clinical Impression Statement The patient met STG for assessing Berg and both STGs and LTGs written.  Patient at high fall risk per Merrilee Jansky.  He is currently using metal upright AFO and may benefit from trialing a lighter weight brace that does not interact with his skin due to diabetes (? toe off brace).  With current AFO, the patient continues with L foot drag.     PT Next Visit Plan Check HEP, try a toe off brace (current brace is heavy and toe off brace will not touch the skin),    PT Home Exercise Plan Check 3 HEP performed today and update/add as indicated.   Consulted and Agree with Plan of Care Patient        Problem List Patient Active Problem List   Diagnosis Date Noted  . Weakness 06/18/2014  . Osteoarthritis, hand 07/23/2012  . Cervical myelopathy 01/19/2012  . Cerebral thrombosis with cerebral infarction 01/19/2012  . Hypotension 07/23/2011  . Sepsis 07/23/2011  . Bradycardia 04/07/2011  . End stage renal disease   . Cervical stenosis of spinal canal   . Atrial fibrillation   . Aortic stenosis   . Diabetes mellitus   . Renal carcinoma   . Carotid bruit   . Hypertension                                             Rudell Cobb, PT, MPT 07/07/2014 2:12 PM [facility] Phone: [phone] Fax: [phone]  Michal Callicott 07/07/2014, 2:11 PM

## 2014-07-07 NOTE — Patient Instructions (Signed)
AMBULATION: Side Step   PLEASE HOLD ONTO COUNTERTOP FOR SAFETY WITH BOTH HANDS.  Step sideways 5-6 steps. Repeat in opposite direction. _5__ reps per set, _1__ sets per day, __4_ days per week   Copyright  VHI. All rights reserved.  Knee Extension: Quadriceps Double Leg Minisquat (Eccentric)   HOLD ONTO COUNTERTOP WITH BOTH HANDS FOR SAFETY. Standing, slowly bend knees for 3-5 seconds. Then extend knees.   _10__ reps per set, _1__ sets per day, _4__ days per week.  Copyright  VHI. All rights reserved.  Functional Quadriceps: Sit to Stand   WITH ROLLING WALKER LOCKED IN FRONT OF YOU AND A STURDY CHAIR BEHIND YOU.  YOU CAN USE YOUR HANDS. Sit on edge of chair, feet flat on floor. Stand upright, extending knees fully. Repeat __5__ times per set. Do ___2_ sets per session. Do __1__ session 4 days per week.  http://orth.exer.us/734   Copyright  VHI. All rights reserved.

## 2014-07-09 ENCOUNTER — Ambulatory Visit: Payer: Medicare Other

## 2014-07-09 DIAGNOSIS — IMO0002 Reserved for concepts with insufficient information to code with codable children: Secondary | ICD-10-CM

## 2014-07-09 DIAGNOSIS — R5381 Other malaise: Secondary | ICD-10-CM

## 2014-07-09 DIAGNOSIS — Z5189 Encounter for other specified aftercare: Secondary | ICD-10-CM | POA: Diagnosis not present

## 2014-07-09 DIAGNOSIS — R269 Unspecified abnormalities of gait and mobility: Secondary | ICD-10-CM

## 2014-07-09 NOTE — Therapy (Signed)
[facility] [address] [place], [place], [address] Phone: [phone]   Fax:  [phone]  Physical Therapy Treatment  Patient Details  Name: [name] MRN: [id number] Date of Birth: [birth year]  Encounter Date: 07/09/2014      PT End of Session - 07/09/14 1056    Visit Number 3   Number of Visits 17   Authorization Type G-code required every 10th visit for Medicare.   PT Start Time 0935   PT Stop Time 1015   PT Time Calculation (min) 40 min   Equipment Utilized During Treatment Gait belt   Activity Tolerance Patient tolerated treatment well   Behavior During Therapy WFL for tasks assessed/performed      Past Medical History  Diagnosis Date  . End stage renal disease     on hemodialysis  . Cervical stenosis of spinal canal     s/p C3-C6 laminectomy with Dr. Vertell Limber and Dr. Joya Salm  . Anemia     chronic disease  . Atrial fibrillation     coumadin followed by SNF, longstanding persistent and probably permanent afib  . Aortic stenosis     Echo 02/12/11: moderate aortic stenosis, mean gradient 21,  . Hyperparathyroidism, secondary   . Diabetes mellitus   . Seizure disorder   . Neuropathy     diabetec  . Hypertension   . Hyperkalemia   . BPH (benign prostatic hypertrophy)   . Gout   . History of stroke   . Renal carcinoma     followed at [facility]; s/p gamma knife surgery  . Carotid bruit     Doppler 7/12: Plaque without significant ICA stenosis  . Bradycardia     asymptomatic  . Atrial enlargement, bilateral     Past Surgical History  Procedure Laterality Date  . Foot surgery    . Radiofrequency ablation kidney    . Neck surgery    . Blood clot Bilateral     There were no vitals taken for this visit.  Visit Diagnosis:  Abnormality of gait  Weakness due to cerebrovascular accident  Debility      Subjective Assessment - 07/09/14 0940    Symptoms Pt reported he feels good. Pt reported his skin is doing  better, and reported MD gave pt medication to use for skin integrity. Pt denied falls since last visit.   Patient Stated Goals To get physically stronger, not fall, and walk longer distances.   Currently in Pain? No/denies            [facility] Adult PT Treatment/Exercise - 07/09/14 0935    Transfers   Transfers Sit to Stand;Stand to Sit   Sit to Stand 5: Supervision;With upper extremity assist;From chair/3-in-1   Sit to Stand Details (indicate cue type and reason) VC's for hand placement and to lock brakes.   Stand to Sit 5: Supervision   Stand to Sit Details VC's for hand placement.   Ambulation/Gait   Ambulation/Gait Yes   Ambulation/Gait Assistance 4: Min guard;5: Supervision   Ambulation/Gait Assistance Details Pt ambulated with his L double upright AFO, L toe off, and L blue rocker AFO with improved foot clearance with blue rocker only. Pt experienced L genu recurvatum at stance, with improvement with L 1/4" heel wedge. VC's to improve L knee/hip flexion during swing phase.   Ambulation Distance (Feet) --  75'x2, 115'   Assistive device Rollator   Gait Pattern Step-to pattern;Decreased step length - right;Decreased stance time - left;Decreased dorsiflexion - right;Decreased  dorsiflexion - left;Left circumduction;Trendelenburg;Trunk flexed;Left genu recurvatum  L foot ER   Exercises   Exercises Knee/Hip   Knee/Hip Exercises: Standing   Functional Squat 10 reps   Functional Squat Limitations Mini squats:with UE support on locked rollator and supervision. VC's to not let knees pass toes.    Other Standing Knee Exercises Sidestepping with B UE support at counter for safety and CGA-supervision. 4x7'. VC's to improve upright posture, improve L foot clearance and to keep L toes pointed towards counter vs. ER.          PT Education - 07/09/14 1056    Education provided Yes   Education Details reviewed HEP   Person(s) Educated Patient   Methods Explanation;Verbal cues   Comprehension  Verbalized understanding;Returned demonstration          PT Short Term Goals - 07/09/14 1101    PT SHORT TERM GOAL #1   Title Pt will be independent in HEP in order to improve functional mobililty.   Time --  07/14/14   Status On-going   PT SHORT TERM GOAL #2   Title Pt will improve TUG time to </=46 seconds., with LRAD, to decr. falls risk.   Time --  07/14/14   Status On-going   PT SHORT TERM GOAL #3   Title Pt will improve gait speed to >/= 1.60ft/sec, with LRAD, to improve functional mobility.   Time --  07/14/14   Status On-going   PT SHORT TERM GOAL #4   Title Pt will ambulate 150' with LRAD, over even terrain, to improve functional mobility.   Time --  07/14/14   Status On-going   PT SHORT TERM GOAL #5   Title Pt will ascend/descent 4 steps without a handrail, with min A, to improve functional mobility.   Status On-going   PT SHORT TERM GOAL #6   Title Perform BERG and write STG and LTG.   Baseline Baseline Berg=25/56.   Time --  07/14/14   Status Achieved   PT SHORT TERM GOAL #7   Title IMprove Berg score from 25/56 up to 30/56 to demo improved steady state standing balance.   Baseline due 07/14/14   Status On-going          PT Long Term Goals - 07/09/14 1103    PT LONG TERM GOAL #1   Title Pt will be able to verbalize understanding of falls prevention strategies to reduce falls risk.   Time --  08/21/14   Status On-going   PT LONG TERM GOAL #2   Title Pt will improve TUG time, with LRAD, to <40seconds to reduce falls risk.   Time --  08/11/14   Status On-going   PT LONG TERM GOAL #3   Title Pt will improve gait speed >/= 1.66ft/sec. to improve functional mobility.   Time --  08/11/14   Status On-going   PT LONG TERM GOAL #4   Title Pt will ambulate 300', with LRAD, over even/uneven terrain, at MOD I level to improve functional mobility.   Time --  08/11/14   Status On-going   PT LONG TERM GOAL #5   Title Pt will ascend/descend 4 step withou a handrail,  with CGA to improve functional mobility.   Time --  08/11/14   Status On-going   PT LONG TERM GOAL #6   Title Pt's ABC score will improve by 10% to improve quality of life.   Baseline ABC: 20.6%   Time --  08/11/14   Status  On-going   PT LONG TERM GOAL #7   Status On-going          Plan - 07/09/14 1057    Clinical Impression Statement Pt demonstrated improve L foot clearance with L blue rocker AFO. Pt no longer ambulates with B knee flexion in stance but does experience L genu recurvatum, which improved with 1/4" heel wedge. Pt may benefit from trialing 1/2" heel wedge for further improvement of genu recurvatum, but unable to trial due to pt's low heel in current shoe. PT educated pt on the importance of supportive shoes. Pt utilized a DME company for his double upright AFO and would like to use the same company, he will provide the company name next visit so PT can request appt. with orthotist.    Pt will benefit from skilled therapeutic intervention in order to improve on the following deficits Abnormal gait;Decreased activity tolerance;Decreased endurance;Decreased coordination;Decreased range of motion;Impaired flexibility;Decreased knowledge of use of DME;Decreased balance;Decreased mobility;Decreased strength   Rehab Potential Good   PT Frequency 2x / week   PT Duration 8 weeks   PT Treatment/Interventions ADLs/Self Care Home Management;Gait training;Neuromuscular re-education;Stair training;Functional mobility training;Patient/family education;Therapeutic activities;Therapeutic exercise;Manual techniques;DME Instruction;Balance training   PT Next Visit Plan Begin to assess STGs. If time allows: Progress gait training with L blue rocker and heel wedge. Terminal knee ext. control exercises to improve genu recurvatum.   Consulted and Agree with Plan of Care Patient                               Problem List Patient Active Problem List   Diagnosis Date Noted  .  Weakness 06/18/2014  . Osteoarthritis, hand 07/23/2012  . Cervical myelopathy 01/19/2012  . Cerebral thrombosis with cerebral infarction 01/19/2012  . Hypotension 07/23/2011  . Sepsis 07/23/2011  . Bradycardia 04/07/2011  . End stage renal disease   . Cervical stenosis of spinal canal   . Atrial fibrillation   . Aortic stenosis   . Diabetes mellitus   . Renal carcinoma   . Carotid bruit   . Hypertension     Pebbles Zeiders L 07/09/2014, 11:04 AM     Geoffry Paradise, PT,DPT 07/09/2014 11:04 AM Phone: [phone] Fax: [phone]

## 2014-07-13 ENCOUNTER — Ambulatory Visit (INDEPENDENT_AMBULATORY_CARE_PROVIDER_SITE_OTHER): Payer: Medicare Other | Admitting: Podiatry

## 2014-07-13 ENCOUNTER — Encounter: Payer: Self-pay | Admitting: Podiatry

## 2014-07-13 DIAGNOSIS — L84 Corns and callosities: Secondary | ICD-10-CM

## 2014-07-13 DIAGNOSIS — G629 Polyneuropathy, unspecified: Secondary | ICD-10-CM

## 2014-07-13 DIAGNOSIS — E1142 Type 2 diabetes mellitus with diabetic polyneuropathy: Secondary | ICD-10-CM

## 2014-07-13 DIAGNOSIS — L608 Other nail disorders: Secondary | ICD-10-CM

## 2014-07-13 DIAGNOSIS — E1140 Type 2 diabetes mellitus with diabetic neuropathy, unspecified: Secondary | ICD-10-CM

## 2014-07-13 NOTE — Progress Notes (Signed)
Patient ID: [name], male   DOB: 1941/08/06, 73 y.o.   MRN: [id number]  Subjective: This patient presents for ongoing debridement of elongated toenails  Objective: Incurvated elongated toenails 10 Keratoses right hallux  Assessment: Elongated toenails, non-dystrophic 6-10 History of diabetic with peripheral neuropathy  Plan: Debrided incurvated toenails 10 keratoses 1 without a bleeding  Reappoint 3 months

## 2014-07-14 ENCOUNTER — Ambulatory Visit: Payer: Medicare Other | Admitting: Physical Therapy

## 2014-07-14 ENCOUNTER — Encounter: Payer: Self-pay | Admitting: Physical Therapy

## 2014-07-14 DIAGNOSIS — R5381 Other malaise: Secondary | ICD-10-CM

## 2014-07-14 DIAGNOSIS — Z5189 Encounter for other specified aftercare: Secondary | ICD-10-CM | POA: Diagnosis not present

## 2014-07-14 DIAGNOSIS — R269 Unspecified abnormalities of gait and mobility: Secondary | ICD-10-CM

## 2014-07-14 DIAGNOSIS — IMO0002 Reserved for concepts with insufficient information to code with codable children: Secondary | ICD-10-CM

## 2014-07-14 NOTE — Therapy (Signed)
[facility] [address] [place], [place], [address] Phone: [phone]   Fax:  [phone]  Physical Therapy Treatment  Patient Details  Name: [name] MRN: [id number] Date of Birth: [birth year]  Encounter Date: 07/14/2014      PT End of Session - 07/14/14 1217    Visit Number 4   Number of Visits 17   Authorization Type G-code required every 10th visit for Medicare.   PT Start Time 1019   PT Stop Time 1102   PT Time Calculation (min) 43 min   Activity Tolerance Patient tolerated treatment well  Needs frequent rest breaks with BERG   Behavior During Therapy [facility] for tasks assessed/performed      Past Medical History  Diagnosis Date  . End stage renal disease     on hemodialysis  . Cervical stenosis of spinal canal     s/p C3-C6 laminectomy with Dr. Vertell Limber and Dr. Joya Salm  . Anemia     chronic disease  . Atrial fibrillation     coumadin followed by SNF, longstanding persistent and probably permanent afib  . Aortic stenosis     Echo 02/12/11: moderate aortic stenosis, mean gradient 21,  . Hyperparathyroidism, secondary   . Diabetes mellitus   . Seizure disorder   . Neuropathy     diabetec  . Hypertension   . Hyperkalemia   . BPH (benign prostatic hypertrophy)   . Gout   . History of stroke   . Renal carcinoma     followed at [facility]; s/p gamma knife surgery  . Carotid bruit     Doppler 7/12: Plaque without significant ICA stenosis  . Bradycardia     asymptomatic  . Atrial enlargement, bilateral     Past Surgical History  Procedure Laterality Date  . Foot surgery    . Radiofrequency ablation kidney    . Neck surgery    . Blood clot Bilateral     There were no vitals taken for this visit.  Visit Diagnosis:  Abnormality of gait  Weakness due to cerebrovascular accident  Debility      Subjective Assessment - 07/14/14 1032    Symptoms Pt denies falls.  Still going down steps backwards with rollator at  top.   Currently in Pain? No/denies            [facility] Adult PT Treatment/Exercise - 07/14/14 1033    Transfers   Transfers Sit to Stand;Stand to Sit   Sit to Stand 6: Modified independent (Device/Increase time)   Stand to Sit 6: Modified independent (Device/Increase time)   Ambulation/Gait   Ambulation/Gait Yes   Ambulation/Gait Assistance 5: Supervision   Ambulation/Gait Assistance Details no LOB today but decreased speed and difficulty clearing LLE   Ambulation Distance (Feet) 150 Feet  100   Assistive device Rollator   Gait Pattern Step-to pattern;Decreased step length - right;Decreased stance time - left;Decreased dorsiflexion - right;Decreased dorsiflexion - left;Left circumduction;Trendelenburg;Trunk flexed;Left genu recurvatum   Stairs --  NT but patient states unable without rail   Furniture conservator/restorer   Sit to Stand Able to stand  independently using hands   Standing Unsupported Able to stand safely 2 minutes   Sitting with Back Unsupported but Feet Supported on Floor or Stool Able to sit safely and securely 2 minutes   Stand to Sit Sits safely with minimal use of hands   Transfers Needs one person to assist   Standing Unsupported with Eyes Closed Able to stand 10  seconds with supervision   Standing Ubsupported with Feet Together Needs help to attain position but able to stand for 30 seconds with feet together   From Standing, Reach Forward with Outstretched Arm Can reach forward >12 cm safely (5")   From Standing Position, Pick up Object from Floor Able to pick up shoe, needs supervision   From Standing Position, Turn to Look Behind Over each Shoulder Turn sideways only but maintains balance   Turn 360 Degrees Needs assistance while turning   Standing Unsupported, Alternately Place Feet on Step/Stool Needs assistance to keep from falling or unable to try   Standing Unsupported, One Foot in Front Needs help to step but can hold 15 seconds   Standing on One Leg Unable to try  or needs assist to prevent fall   Total Score 29   Knee/Hip Exercises: Standing   Functional Squat 15 reps   Functional Squat Limitations Mini squats:with UE support on locked rollator and supervision. VC's to not let knees pass toes.    Other Standing Knee Exercises Sidestepping with B UE support at counter for safety and CGA-supervision. 4x7'. VC's to improve upright posture, improve L foot clearance and to keep L toes pointed towards counter vs. ER.   Transfers   Sit to Stand Details Other (comment)  Sit to stand x 15 reps with UE from mat as part of HEP            PT Short Term Goals - 07/14/14 1220    PT SHORT TERM GOAL #1   Title Pt will be independent in HEP in order to improve functional mobililty.   Status Achieved   PT SHORT TERM GOAL #2   Title Pt will improve TUG time to </=46 seconds., with LRAD, to decr. falls risk.   Status On-going   PT SHORT TERM GOAL #3   Title Pt will improve gait speed to >/= 1.60f/sec, with LRAD, to improve functional mobility.   Status On-going   PT SHORT TERM GOAL #4   Title Pt will ambulate 150' with LRAD, over even terrain, to improve functional mobility.   Status Achieved   PT SHORT TERM GOAL #5   Title Pt will ascend/descent 4 steps without a handrail, with min A, to improve functional mobility.   Status Not Met   PT SHORT TERM GOAL #7   Title IMprove Berg score from 25/56 up to 30/56 to demo improved steady state standing balance.   Status Not Met            Plan - 07/14/14 1217    Clinical Impression Statement Pt met STG # 1 and 4.  # 5 & 7 not met.  Pt states his current AFO's came from [employer]   Faxed patients appointment times to [facility].   Pt will benefit from skilled therapeutic intervention in order to improve on the following deficits Abnormal gait;Decreased activity tolerance;Decreased endurance;Decreased coordination;Decreased range of motion;Impaired flexibility;Decreased knowledge of use of  DME;Decreased balance;Decreased mobility;Decreased strength   Rehab Potential Good   PT Frequency 2x / week   PT Duration 8 weeks   PT Next Visit Plan Finish checking goals.  Progress gait with L blue rocker and heel wedge.   Consulted and Agree with Plan of Care Patient       Problem List Patient Active Problem List   Diagnosis Date Noted  . Weakness 06/18/2014  . Osteoarthritis, hand 07/23/2012  . Cervical myelopathy 01/19/2012  . Cerebral thrombosis with cerebral infarction  01/19/2012  . Hypotension 07/23/2011  . Sepsis 07/23/2011  . Bradycardia 04/07/2011  . End stage renal disease   . Cervical stenosis of spinal canal   . Atrial fibrillation   . Aortic stenosis   . Diabetes mellitus   . Renal carcinoma   . Carotid bruit   . Hypertension     Narda Bonds 07/14/2014, 12:25 PM     Narda Bonds, PTA [place] 07/14/2014 12:25 PM Phone: [phone] Fax: [phone]

## 2014-07-16 ENCOUNTER — Ambulatory Visit: Payer: Medicare Other | Admitting: Physical Therapy

## 2014-07-21 ENCOUNTER — Ambulatory Visit: Payer: Medicare Other

## 2014-07-21 DIAGNOSIS — IMO0002 Reserved for concepts with insufficient information to code with codable children: Secondary | ICD-10-CM

## 2014-07-21 DIAGNOSIS — R269 Unspecified abnormalities of gait and mobility: Secondary | ICD-10-CM

## 2014-07-21 DIAGNOSIS — R5381 Other malaise: Secondary | ICD-10-CM

## 2014-07-21 DIAGNOSIS — Z5189 Encounter for other specified aftercare: Secondary | ICD-10-CM | POA: Diagnosis not present

## 2014-07-21 NOTE — Therapy (Signed)
[facility] [address] [place], [place], [address] Phone: [phone]   Fax:  [phone]  Physical Therapy Treatment  Patient Details  Name: [name] MRN: [id number] Date of Birth: 1941-05-17  Encounter Date: 07/21/2014      PT End of Session - 07/21/14 1505    Visit Number 5   Number of Visits 17   Authorization Type G-code required every 10th visit for Medicare.   PT Start Time 1015   PT Stop Time 1058   PT Time Calculation (min) 43 min   Equipment Utilized During Treatment Gait belt   Activity Tolerance Patient tolerated treatment well   Behavior During Therapy WFL for tasks assessed/performed      Past Medical History  Diagnosis Date  . End stage renal disease     on hemodialysis  . Cervical stenosis of spinal canal     s/p C3-C6 laminectomy with Dr. Vertell Limber and Dr. Joya Salm  . Anemia     chronic disease  . Atrial fibrillation     coumadin followed by SNF, longstanding persistent and probably permanent afib  . Aortic stenosis     Echo 02/12/11: moderate aortic stenosis, mean gradient 21,  . Hyperparathyroidism, secondary   . Diabetes mellitus   . Seizure disorder   . Neuropathy     diabetec  . Hypertension   . Hyperkalemia   . BPH (benign prostatic hypertrophy)   . Gout   . History of stroke   . Renal carcinoma     followed at [facility]; s/p gamma knife surgery  . Carotid bruit     Doppler 7/12: Plaque without significant ICA stenosis  . Bradycardia     asymptomatic  . Atrial enlargement, bilateral     Past Surgical History  Procedure Laterality Date  . Foot surgery    . Radiofrequency ablation kidney    . Neck surgery    . Blood clot Bilateral     There were no vitals taken for this visit.  Visit Diagnosis:  Abnormality of gait  Weakness due to cerebrovascular accident  Debility      Subjective Assessment - 07/21/14 1021    Symptoms Pt denied falls or changes since last visit.  Pt reported  he is a little tired this morning, as he was unable to sleep last night so he got up at 3AM and performed sit<>stand exercises.   Patient Stated Goals To get physically stronger, not fall, and walk longer distances.   Currently in Pain? No/denies            [facility] Adult PT Treatment/Exercise - 07/21/14 1022    Ambulation/Gait   Ambulation/Gait Yes   Ambulation/Gait Assistance 5: Supervision   Ambulation/Gait Assistance Details Pt ambulated over even terrain while performing head turns and 180 degree turns, with L blue rocker donned with heel wedge to correct L genu recurvatum. Pt reported less L LE fatigue during ambulation with blue rocker with heel wedge vs. metal double upright AFO. VC's to improve hip/knee flexion to clear L foot, especially when pt fatigue. Pt required seated rest breaks due to fatigue.   Ambulation Distance (Feet) --  150' with blue rocker and heel wedge, 75'x2, 50'x2   Assistive device Rollator   Gait Pattern Step-to pattern;Decreased step length - right;Decreased stance time - left;Decreased dorsiflexion - right;Decreased dorsiflexion - left;Left circumduction;Trendelenburg;Trunk flexed;Left genu recurvatum   Gait velocity 0.41ft/sec.   Standardized Balance Assessment   Standardized Balance Assessment Timed Up and Go Test  Timed Up and Go Test   TUG Normal TUG   Normal TUG (seconds) 45.52          PT Education - 07/21/14 1503    Education provided Yes   Education Details PT educated pt on the importance of wearing a shoe with good heel support, as pt's current shoe has a squished heel and his foot slides out. Pt reported he would bring another shoe to trial with L blue rocker next visit.   Person(s) Educated Patient   Methods Explanation   Comprehension Verbalized understanding          PT Short Term Goals - 07/21/14 1507    PT SHORT TERM GOAL #1   Title Pt will be independent in HEP in order to improve functional mobililty.   Status Achieved   PT  SHORT TERM GOAL #2   Title Pt will improve TUG time to </=46 seconds., with LRAD, to decr. falls risk.   Baseline 45.52 seconds with rollator on 07/21/14.   Status Achieved   PT SHORT TERM GOAL #3   Title Pt will improve gait speed to >/= 1.81ft/sec, with LRAD, to improve functional mobility.   Baseline 0.21ft/sec. with rollator   Status Not Met   PT SHORT TERM GOAL #4   Title Pt will ambulate 150' with LRAD, over even terrain, to improve functional mobility.   Status Achieved   PT SHORT TERM GOAL #5   Title Pt will ascend/descent 4 steps without a handrail, with min A, to improve functional mobility.   Status Not Met   PT SHORT TERM GOAL #7   Title IMprove Berg score from 25/56 up to 30/56 to demo improved steady state standing balance.   Status Not Met          PT Long Term Goals - 07/21/14 1508    PT LONG TERM GOAL #1   Title Pt will be able to verbalize understanding of falls prevention strategies to reduce falls risk.   Time --  08/21/14   Status On-going   PT LONG TERM GOAL #2   Title Pt will improve TUG time, with LRAD, to <40seconds to reduce falls risk.   Time --  08/11/14   Status On-going   PT LONG TERM GOAL #3   Title Pt will improve gait speed >/= 1.28ft/sec. to improve functional mobility.   Baseline Revised base on pt's 0.35ft/sec gait speed on 07/21/14.   Time --  08/11/14   Status Revised   PT LONG TERM GOAL #4   Title Pt will ambulate 300', with LRAD, over even/uneven terrain, at MOD I level to improve functional mobility.   Time --  08/11/14   Status On-going   PT LONG TERM GOAL #5   Title Pt will ascend/descend 4 step withou a handrail, with CGA to improve functional mobility.   Time --  08/11/14   Status On-going   PT LONG TERM GOAL #6   Title Pt's ABC score will improve by 10% to improve quality of life.   Baseline ABC: 20.6%   Time --  08/11/14   Status On-going   PT LONG TERM GOAL #7   Status On-going          Plan - 07/21/14 1505    Clinical  Impression Statement Pt met STG #2 (TUG), but did not meet STG #3(gait speed). However, gait speed did improve from 0.80ft/sec. to 0.71ft/sec. Pt continues to experience poor foot clearnance during ambulation due to decr. L  dorsiflexion and knee/hip flexion. Pt would continue to benefit from skilled PT to improve safety during functional mobility.   Pt will benefit from skilled therapeutic intervention in order to improve on the following deficits Abnormal gait;Decreased activity tolerance;Decreased endurance;Decreased coordination;Decreased range of motion;Impaired flexibility;Decreased knowledge of use of DME;Decreased balance;Decreased mobility;Decreased strength   Rehab Potential Good   PT Frequency 2x / week   PT Duration 8 weeks   PT Treatment/Interventions ADLs/Self Care Home Management;Gait training;Neuromuscular re-education;Stair training;Functional mobility training;Patient/family education;Therapeutic activities;Therapeutic exercise;Manual techniques;DME Instruction;Balance training   PT Next Visit Plan Progress gait training with L blue rocker/heel wedge (with new shoe-pt to bring one to next appt). Dynamic balance training.   Consulted and Agree with Plan of Care Patient                               Problem List Patient Active Problem List   Diagnosis Date Noted  . Weakness 06/18/2014  . Osteoarthritis, hand 07/23/2012  . Cervical myelopathy 01/19/2012  . Cerebral thrombosis with cerebral infarction 01/19/2012  . Hypotension 07/23/2011  . Sepsis 07/23/2011  . Bradycardia 04/07/2011  . End stage renal disease   . Cervical stenosis of spinal canal   . Atrial fibrillation   . Aortic stenosis   . Diabetes mellitus   . Renal carcinoma   . Carotid bruit   . Hypertension     Anahla Bevis L 07/21/2014, 3:12 PM     Geoffry Paradise, PT,DPT 07/21/2014 3:12 PM Phone: [phone] Fax: [phone]

## 2014-07-23 ENCOUNTER — Ambulatory Visit: Payer: Medicare Other

## 2014-07-23 DIAGNOSIS — Z5189 Encounter for other specified aftercare: Secondary | ICD-10-CM | POA: Diagnosis not present

## 2014-07-23 DIAGNOSIS — IMO0002 Reserved for concepts with insufficient information to code with codable children: Secondary | ICD-10-CM

## 2014-07-23 DIAGNOSIS — R269 Unspecified abnormalities of gait and mobility: Secondary | ICD-10-CM

## 2014-07-23 DIAGNOSIS — R5381 Other malaise: Secondary | ICD-10-CM

## 2014-07-23 NOTE — Therapy (Signed)
[facility] [address] [place], [place], [address] Phone: [phone]   Fax:  [phone]  Physical Therapy Treatment  Patient Details  Name: [name] MRN: [id number] Date of Birth: 09-23-1940  Encounter Date: 07/23/2014      PT End of Session - 07/23/14 1129    Visit Number 6   Number of Visits 17   Authorization Type G-code required every 10th visit for Medicare.   PT Start Time [phone]   PT Stop Time 1014   PT Time Calculation (min) 41 min   Equipment Utilized During Treatment Gait belt   Activity Tolerance Patient tolerated treatment well   Behavior During Therapy WFL for tasks assessed/performed      Past Medical History  Diagnosis Date  . End stage renal disease     on hemodialysis  . Cervical stenosis of spinal canal     s/p C3-C6 laminectomy with Dr. Venetia Maxon and Dr. Jeral Fruit  . Anemia     chronic disease  . Atrial fibrillation     coumadin followed by SNF, longstanding persistent and probably permanent afib  . Aortic stenosis     Echo 02/12/11: moderate aortic stenosis, mean gradient 21,  . Hyperparathyroidism, secondary   . Diabetes mellitus   . Seizure disorder   . Neuropathy     diabetec  . Hypertension   . Hyperkalemia   . BPH (benign prostatic hypertrophy)   . Gout   . History of stroke   . Renal carcinoma     followed at [facility]; s/p gamma knife surgery  . Carotid bruit     Doppler 7/12: Plaque without significant ICA stenosis  . Bradycardia     asymptomatic  . Atrial enlargement, bilateral     Past Surgical History  Procedure Laterality Date  . Foot surgery    . Radiofrequency ablation kidney    . Neck surgery    . Blood clot Bilateral     There were no vitals taken for this visit.  Visit Diagnosis:  Abnormality of gait  Weakness due to cerebrovascular accident  Debility      Subjective Assessment - 07/23/14 0938    Symptoms Pt denied falls or changes since last visit. Pt reported  he is feeling good.    Patient Stated Goals To get physically stronger, not fall, and walk longer distances.   Currently in Pain? No/denies            [facility] Adult PT Treatment/Exercise - 07/23/14 0939    Ambulation/Gait   Ambulation/Gait Yes   Ambulation/Gait Assistance 5: Supervision   Ambulation/Gait Assistance Details Pt ambulated over even terrain, while performing head turns, with L blue rocker and 1/2" heel wedge donned.  VC's to improve L hip/knee flexion and decr. scissor gait around turns.  Pt required seated rest breaks after each bout of ambulation due to fatigue.   Ambulation Distance (Feet) --  230', 115', 75'   Assistive device Rollator   Gait Pattern Step-to pattern;Decreased step length - right;Decreased stance time - left;Decreased dorsiflexion - right;Decreased dorsiflexion - left;Left circumduction;Trendelenburg;Trunk flexed;Left genu recurvatum   Balance   Balance Assessed Yes   Dynamic Standing Balance   Dynamic Standing - Balance Support Left upper extremity supported   Dynamic Standing - Level of Assistance 4: Min assist;Other (comment)  min guard   Dynamic Standing - Balance Activities Alternating  foot traps  on dots   Dynamic Standing - Comments B LE toe taps on dots 2x10: VC's to  improve weight shifting and knee/hip flexion to improve foot clearance. Min A required to maintain balance during 2 LOB episodes, and pt unable to attempt without UE support as he required mod A to maintain balance.            PT Short Term Goals - 07/23/14 1131    PT SHORT TERM GOAL #1   Title Pt will be independent in HEP in order to improve functional mobililty.   Status Achieved   PT SHORT TERM GOAL #2   Title Pt will improve TUG time to </=46 seconds., with LRAD, to decr. falls risk.   Baseline 45.52 seconds with rollator on 07/21/14.   Status Achieved   PT SHORT TERM GOAL #3   Title Pt will improve gait speed to >/= 1.10ft/sec, with LRAD, to improve functional  mobility.   Baseline 0.8ft/sec. with rollator   Status Not Met   PT SHORT TERM GOAL #4   Title Pt will ambulate 150' with LRAD, over even terrain, to improve functional mobility.   Status Achieved   PT SHORT TERM GOAL #5   Title Pt will ascend/descent 4 steps without a handrail, with min A, to improve functional mobility.   Status Not Met   PT SHORT TERM GOAL #7   Title IMprove Berg score from 25/56 up to 30/56 to demo improved steady state standing balance.   Status Not Met          PT Long Term Goals - 07/23/14 1131    PT LONG TERM GOAL #1   Title Pt will be able to verbalize understanding of falls prevention strategies to reduce falls risk.   Time --  08/21/14   Status On-going   PT LONG TERM GOAL #2   Title Pt will improve TUG time, with LRAD, to <40seconds to reduce falls risk.   Time --  08/11/14   Status On-going   PT LONG TERM GOAL #3   Title Pt will improve gait speed >/= 1.10ft/sec. to improve functional mobility.   Baseline Revised base on pt's 0.26ft/sec gait speed on 07/21/14.   Time --  08/11/14   Status On-going   PT LONG TERM GOAL #4   Title Pt will ambulate 300', with LRAD, over even/uneven terrain, at MOD I level to improve functional mobility.   Time --  08/11/14   Status On-going   PT LONG TERM GOAL #5   Title Pt will ascend/descend 4 step withou a handrail, with CGA to improve functional mobility.   Time --  08/11/14   Status On-going   PT LONG TERM GOAL #6   Title Pt's ABC score will improve by 10% to improve quality of life.   Baseline ABC: 20.6%   Time --  08/11/14   Status On-going   PT LONG TERM GOAL #7   Status On-going          Plan - 07/23/14 1129    Clinical Impression Statement Pt demonstrated progress as he was able to ambulate longer distances with L blue rocker (and heel wedge) today, and required less VC's to improve foot clearance. Pt continues to experience difficulty with dynamic balance acitivites, as he required min A during LOB  episodes. Continue with POC. Pt informed orthotist will be present on 12/31 appt.   Pt will benefit from skilled therapeutic intervention in order to improve on the following deficits Abnormal gait;Decreased activity tolerance;Decreased endurance;Decreased coordination;Decreased range of motion;Impaired flexibility;Decreased knowledge of use of DME;Decreased balance;Decreased mobility;Decreased strength  Rehab Potential Good   PT Frequency 2x / week   PT Duration 8 weeks   PT Treatment/Interventions ADLs/Self Care Home Management;Gait training;Neuromuscular re-education;Stair training;Functional mobility training;Patient/family education;Therapeutic activities;Therapeutic exercise;Manual techniques;DME Instruction;Balance training   PT Next Visit Plan Continue gait training with L blue rocker/heel wedge (with new shoe-pt to bring one to next appt). Dynamic balance training.   Consulted and Agree with Plan of Care Patient                               Problem List Patient Active Problem List   Diagnosis Date Noted  . Weakness 06/18/2014  . Osteoarthritis, hand 07/23/2012  . Cervical myelopathy 01/19/2012  . Cerebral thrombosis with cerebral infarction 01/19/2012  . Hypotension 07/23/2011  . Sepsis 07/23/2011  . Bradycardia 04/07/2011  . End stage renal disease   . Cervical stenosis of spinal canal   . Atrial fibrillation   . Aortic stenosis   . Diabetes mellitus   . Renal carcinoma   . Carotid bruit   . Hypertension     Kashlynn Kundert L 07/23/2014, 11:32 AM     Geoffry Paradise, PT,DPT 07/23/2014 11:32 AM Phone: [phone] Fax: [phone]

## 2014-07-28 ENCOUNTER — Ambulatory Visit: Payer: Medicare Other | Admitting: Physical Therapy

## 2014-07-28 ENCOUNTER — Encounter: Payer: Self-pay | Admitting: Physical Therapy

## 2014-07-28 DIAGNOSIS — R269 Unspecified abnormalities of gait and mobility: Secondary | ICD-10-CM

## 2014-07-28 DIAGNOSIS — Z5189 Encounter for other specified aftercare: Secondary | ICD-10-CM | POA: Diagnosis not present

## 2014-07-28 DIAGNOSIS — R5381 Other malaise: Secondary | ICD-10-CM

## 2014-07-28 DIAGNOSIS — IMO0002 Reserved for concepts with insufficient information to code with codable children: Secondary | ICD-10-CM

## 2014-07-28 NOTE — Therapy (Signed)
[facility] [address] [place], [place], [address] Phone: [phone]   Fax:  [phone]  Physical Therapy Treatment  Patient Details  Name: [name] MRN: [id number] Date of Birth: [birth year]  Encounter Date: 07/28/2014      PT End of Session - 07/28/14 1259    Visit Number 7   Number of Visits 17   Authorization Type G-code required every 10th visit for Medicare.   PT Start Time 1020   PT Stop Time 1104   PT Time Calculation (min) 44 min   Equipment Utilized During Treatment Gait belt   Activity Tolerance Patient tolerated treatment well   Behavior During Therapy WFL for tasks assessed/performed      Past Medical History  Diagnosis Date  . End stage renal disease     on hemodialysis  . Cervical stenosis of spinal canal     s/p C3-C6 laminectomy with Dr. Vertell Limber and Dr. Joya Salm  . Anemia     chronic disease  . Atrial fibrillation     coumadin followed by SNF, longstanding persistent and probably permanent afib  . Aortic stenosis     Echo 02/12/11: moderate aortic stenosis, mean gradient 21,  . Hyperparathyroidism, secondary   . Diabetes mellitus   . Seizure disorder   . Neuropathy     diabetec  . Hypertension   . Hyperkalemia   . BPH (benign prostatic hypertrophy)   . Gout   . History of stroke   . Renal carcinoma     followed at [facility]; s/p gamma knife surgery  . Carotid bruit     Doppler 7/12: Plaque without significant ICA stenosis  . Bradycardia     asymptomatic  . Atrial enlargement, bilateral     Past Surgical History  Procedure Laterality Date  . Foot surgery    . Radiofrequency ablation kidney    . Neck surgery    . Blood clot Bilateral     There were no vitals taken for this visit.  Visit Diagnosis:  Abnormality of gait  Weakness due to cerebrovascular accident  Debility      Subjective Assessment - 07/28/14 1256    Symptoms Denies falls or changes.   Currently in  Pain? No/denies                    [facility] Adult PT Treatment/Exercise - 07/28/14 1256    Transfers   Transfers Sit to Stand;Stand to Sit   Sit to Stand 6: Modified independent (Device/Increase time)   Sit to Stand Details (indicate cue type and reason) cues for hand placement   Stand to Sit 6: Modified independent (Device/Increase time)   Stand to Sit Details cues for hand placement   Ambulation/Gait   Ambulation/Gait Yes   Ambulation/Gait Assistance 5: Supervision   Ambulation/Gait Assistance Details verbal cues for L hip flexion, L heel strike and L hip internal rotation   Ambulation Distance (Feet) 220 Feet  4 times   Assistive device Rollator   Gait Pattern Step-to pattern;Decreased step length - right;Decreased stance time - left;Decreased dorsiflexion - right;Decreased dorsiflexion - left;Left circumduction;Trendelenburg;Trunk flexed;Left genu recurvatum                  PT Short Term Goals - 07/23/14 1131    PT SHORT TERM GOAL #1   Title Pt will be independent in HEP in order to improve functional mobililty.   Status Achieved   PT SHORT TERM GOAL #2  Title Pt will improve TUG time to </=46 seconds., with LRAD, to decr. falls risk.   Baseline 45.52 seconds with rollator on 07/21/14.   Status Achieved   PT SHORT TERM GOAL #3   Title Pt will improve gait speed to >/= 1.96f/sec, with LRAD, to improve functional mobility.   Baseline 0.872fsec. with rollator   Status Not Met   PT SHORT TERM GOAL #4   Title Pt will ambulate 150' with LRAD, over even terrain, to improve functional mobility.   Status Achieved   PT SHORT TERM GOAL #5   Title Pt will ascend/descent 4 steps without a handrail, with min A, to improve functional mobility.   Status Not Met   PT SHORT TERM GOAL #7   Title IMprove Berg score from 25/56 up to 30/56 to demo improved steady state standing balance.   Status Not Met           PT Long Term Goals - 07/23/14 1131    PT LONG TERM  GOAL #1   Title Pt will be able to verbalize understanding of falls prevention strategies to reduce falls risk.   Time --  08/21/14   Status On-going   PT LONG TERM GOAL #2   Title Pt will improve TUG time, with LRAD, to <40seconds to reduce falls risk.   Time --  08/11/14   Status On-going   PT LONG TERM GOAL #3   Title Pt will improve gait speed >/= 1.39f59fec. to improve functional mobility.   Baseline Revised base on pt's 0.2f95fc gait speed on 07/21/14.   Time --  08/11/14   Status On-going   PT LONG TERM GOAL #4   Title Pt will ambulate 300', with LRAD, over even/uneven terrain, at MOD I level to improve functional mobility.   Time --  08/11/14   Status On-going   PT LONG TERM GOAL #5   Title Pt will ascend/descend 4 step withou a handrail, with CGA to improve functional mobility.   Time --  08/11/14   Status On-going   PT LONG TERM GOAL #6   Title Pt's ABC score will improve by 10% to improve quality of life.   Baseline ABC: 20.6%   Time --  08/11/14   Status On-going   PT LONG TERM GOAL #7   Status On-going               Plan - 07/28/14 1300    Clinical Impression Statement Pt continues to progress ambulation.  Trialed gait with L blue rocker with heel wedge, L [place] and L toe off.  Pt tends to have L knee hyperextension at times with gait in blue rocker and [place].  Appears improved with toe off.   Pt will benefit from skilled therapeutic intervention in order to improve on the following deficits Abnormal gait;Decreased activity tolerance;Decreased endurance;Decreased coordination;Decreased range of motion;Impaired flexibility;Decreased knowledge of use of DME;Decreased balance;Decreased mobility;Decreased strength   Rehab Potential Good   PT Frequency 2x / week   PT Duration 8 weeks   PT Treatment/Interventions ADLs/Self Care Home Management;Gait training;Neuromuscular re-education;Stair training;Functional mobility training;Patient/family education;Therapeutic  activities;Therapeutic exercise;Manual techniques;DME Instruction;Balance training   PT Next Visit Plan Dynamic balance, L LE strengthening        Problem List Patient Active Problem List   Diagnosis Date Noted  . Weakness 06/18/2014  . Osteoarthritis, hand 07/23/2012  . Cervical myelopathy 01/19/2012  . Cerebral thrombosis with cerebral infarction 01/19/2012  . Hypotension 07/23/2011  . Sepsis 07/23/2011  .  Bradycardia 04/07/2011  . End stage renal disease   . Cervical stenosis of spinal canal   . Atrial fibrillation   . Aortic stenosis   . Diabetes mellitus   . Renal carcinoma   . Carotid bruit   . Hypertension     Narda Bonds 07/28/2014, 1:03 PM  [facility] [address] [place], [place], [address] Phone: [phone]   Fax:  [place], [place] 07/28/2014 1:04 PM Phone: [phone] Fax: [phone]

## 2014-07-30 ENCOUNTER — Ambulatory Visit: Payer: Medicare Other

## 2014-07-30 DIAGNOSIS — R269 Unspecified abnormalities of gait and mobility: Secondary | ICD-10-CM

## 2014-07-30 DIAGNOSIS — IMO0002 Reserved for concepts with insufficient information to code with codable children: Secondary | ICD-10-CM

## 2014-07-30 DIAGNOSIS — R5381 Other malaise: Secondary | ICD-10-CM

## 2014-07-30 DIAGNOSIS — Z5189 Encounter for other specified aftercare: Secondary | ICD-10-CM | POA: Diagnosis not present

## 2014-07-30 NOTE — Therapy (Signed)
[facility] [address] [place], [place], [address] Phone: [phone]   Fax:  [phone]  Physical Therapy Treatment  Patient Details  Name: [name] MRN: [id number] Date of Birth: 12-08-1940  Encounter Date: 07/30/2014      PT End of Session - 07/30/14 1219    Visit Number 8   Number of Visits 17   Authorization Type G-code required every 10th visit for Medicare.   PT Start Time 1023   PT Stop Time 1101   PT Time Calculation (min) 38 min      Past Medical History  Diagnosis Date  . End stage renal disease     on hemodialysis  . Cervical stenosis of spinal canal     s/p C3-C6 laminectomy with Dr. Vertell Limber and Dr. Joya Salm  . Anemia     chronic disease  . Atrial fibrillation     coumadin followed by SNF, longstanding persistent and probably permanent afib  . Aortic stenosis     Echo 02/12/11: moderate aortic stenosis, mean gradient 21,  . Hyperparathyroidism, secondary   . Diabetes mellitus   . Seizure disorder   . Neuropathy     diabetec  . Hypertension   . Hyperkalemia   . BPH (benign prostatic hypertrophy)   . Gout   . History of stroke   . Renal carcinoma     followed at [facility]; s/p gamma knife surgery  . Carotid bruit     Doppler 7/12: Plaque without significant ICA stenosis  . Bradycardia     asymptomatic  . Atrial enlargement, bilateral     Past Surgical History  Procedure Laterality Date  . Foot surgery    . Radiofrequency ablation kidney    . Neck surgery    . Blood clot Bilateral     There were no vitals taken for this visit.  Visit Diagnosis:  Abnormality of gait  Weakness due to cerebrovascular accident  Debility      Subjective Assessment - 07/30/14 1027    Symptoms Pt arrived 8 minutes late. Pt denied falls or changes since last visit. "I feel alright today, a little lazy."   Patient Stated Goals To get physically stronger, not fall, and walk longer distances.   Currently in Pain? No/denies                    [facility] Adult PT Treatment/Exercise - 07/30/14 0001    Transfers   Transfers Sit to Stand;Stand to Sit   Sit to Stand 4: Min guard   Sit to Stand Details (indicate cue type and reason) x10 with 1-2 UE support with and without 2" step under R LE. VC's to improve ant. weight shifting.   Stand to Sit 4: Min guard   Stand to Sit Details x10. VC's to improve eccentric control.   Balance   Balance Assessed Yes   Dynamic Standing Balance   Dynamic Standing - Balance Support Left upper extremity supported;No upper extremity supported   Dynamic Standing - Level of Assistance 4: Min assist;Other (comment)  min guard   Dynamic Standing - Balance Activities Alternating  foot traps   Dynamic Standing - Comments B LE toe/heel taps x10/LE on 2" and 1/2" step/beam: pt required min A to perform toe taps with L LE and no UE support. VC's to improve lateral weight shifting.     Therex: Supine: B LE -SAQ's x10/LE. VC's for technique and min A to fully extend L knee with  AFO donned. -B hip marches x10/LE. PT placed sheet around L foot and pt used B UEs to assist L hip flexion. VC's to not ER rotate LE/hip. Sidelying and seated: -L hamstring curls/slides x10. Min A in sidelying to guide L LE. PT added seated heel slides to HEP as pt was able to perform independently with sheet under L foot. VC's for technique. Pt required frequent rest breaks during session due to fatigue.           PT Education - 07/30/14 1219    Education provided Yes   Education Details Strengthening HEP   Person(s) Educated Patient   Methods Explanation;Demonstration;Tactile cues;Verbal cues;Handout   Comprehension Verbalized understanding;Returned demonstration          PT Short Term Goals - 07/23/14 1131    PT SHORT TERM GOAL #1   Title Pt will be independent in HEP in order to improve functional mobililty.   Status Achieved   PT SHORT TERM GOAL #2   Title  Pt will improve TUG time to </=46 seconds., with LRAD, to decr. falls risk.   Baseline 45.52 seconds with rollator on 07/21/14.   Status Achieved   PT SHORT TERM GOAL #3   Title Pt will improve gait speed to >/= 1.81ft/sec, with LRAD, to improve functional mobility.   Baseline 0.72ft/sec. with rollator   Status Not Met   PT SHORT TERM GOAL #4   Title Pt will ambulate 150' with LRAD, over even terrain, to improve functional mobility.   Status Achieved   PT SHORT TERM GOAL #5   Title Pt will ascend/descent 4 steps without a handrail, with min A, to improve functional mobility.   Status Not Met   PT SHORT TERM GOAL #7   Title IMprove Berg score from 25/56 up to 30/56 to demo improved steady state standing balance.   Status Not Met           PT Long Term Goals - 07/23/14 1131    PT LONG TERM GOAL #1   Title Pt will be able to verbalize understanding of falls prevention strategies to reduce falls risk.   Time --  08/21/14   Status On-going   PT LONG TERM GOAL #2   Title Pt will improve TUG time, with LRAD, to <40seconds to reduce falls risk.   Time --  08/11/14   Status On-going   PT LONG TERM GOAL #3   Title Pt will improve gait speed >/= 1.6ft/sec. to improve functional mobility.   Baseline Revised base on pt's 0.71ft/sec gait speed on 07/21/14.   Time --  08/11/14   Status On-going   PT LONG TERM GOAL #4   Title Pt will ambulate 300', with LRAD, over even/uneven terrain, at MOD I level to improve functional mobility.   Time --  08/11/14   Status On-going   PT LONG TERM GOAL #5   Title Pt will ascend/descend 4 step withou a handrail, with CGA to improve functional mobility.   Time --  08/11/14   Status On-going   PT LONG TERM GOAL #6   Title Pt's ABC score will improve by 10% to improve quality of life.   Baseline ABC: 20.6%   Time --  08/11/14   Status On-going   PT LONG TERM GOAL #7   Status On-going               Plan - 07/30/14 1220    Clinical Impression  Statement Pt demonstrated progress as he  tolerated strengthening HEP well. Pt reported he feels as though L foot is dragging more due to the heaviness of his current L metal AFO. Continue with POC.   Pt will benefit from skilled therapeutic intervention in order to improve on the following deficits Abnormal gait;Decreased activity tolerance;Decreased endurance;Decreased coordination;Decreased range of motion;Impaired flexibility;Decreased knowledge of use of DME;Decreased balance;Decreased mobility;Decreased strength   Rehab Potential Good   PT Frequency 2x / week   PT Duration 8 weeks   PT Treatment/Interventions ADLs/Self Care Home Management;Gait training;Neuromuscular re-education;Stair training;Functional mobility training;Patient/family education;Therapeutic activities;Therapeutic exercise;Manual techniques;DME Instruction;Balance training   PT Next Visit Plan L LE strengthening, dynamic gait/balance training    Consulted and Agree with Plan of Care Patient        Problem List Patient Active Problem List   Diagnosis Date Noted  . Weakness 06/18/2014  . Osteoarthritis, hand 07/23/2012  . Cervical myelopathy 01/19/2012  . Cerebral thrombosis with cerebral infarction 01/19/2012  . Hypotension 07/23/2011  . Sepsis 07/23/2011  . Bradycardia 04/07/2011  . End stage renal disease   . Cervical stenosis of spinal canal   . Atrial fibrillation   . Aortic stenosis   . Diabetes mellitus   . Renal carcinoma   . Carotid bruit   . Hypertension     Dezarae Mcclaran L 07/30/2014, 12:23 PM  [facility] [address] [place], [place], [address] Phone: [phone]   Fax:  [phone]     Geoffry Paradise, PT,DPT 07/30/2014 12:23 PM Phone: [phone] Fax: [phone]

## 2014-07-30 NOTE — Patient Instructions (Signed)
KNEE: Extension, Short Arc Quads - Supine   Place bolster under knees. Raise one leg until knee is straight. _10__ reps per set, _3__ sets per day, _7__ days per week   Copyright  VHI. All rights reserved.  Seated heel slide   Place towel/sheet under R foot, then slide foot back towards chair, then return to starting position. Repeat with L foot. Repeat _10___ times, 3 sets. Do __1__ sessions per day.  http://gt2.exer.us/435   Copyright  VHI. All rights reserved.  Knee to Chest With 4-6 Inch Lift   Bring one knee up, then return. (Place sheet around left foot to assist). Keep pelvis still. Be sure pelvis does not tilt forward or backward or roll side to side. Lift knee _10__ times. Restabilize pelvis. Repeat with other knee. Do __3_ sets, _1__ times per day.  http://ss.exer.us/2   Copyright  VHI. All rights reserved.

## 2014-08-04 ENCOUNTER — Ambulatory Visit: Payer: Medicare Other

## 2014-08-06 ENCOUNTER — Encounter: Payer: Self-pay | Admitting: Physical Therapy

## 2014-08-06 ENCOUNTER — Ambulatory Visit: Payer: Medicare Other | Admitting: Physical Therapy

## 2014-08-06 DIAGNOSIS — R269 Unspecified abnormalities of gait and mobility: Secondary | ICD-10-CM

## 2014-08-06 DIAGNOSIS — IMO0002 Reserved for concepts with insufficient information to code with codable children: Secondary | ICD-10-CM

## 2014-08-06 DIAGNOSIS — Z5189 Encounter for other specified aftercare: Secondary | ICD-10-CM | POA: Diagnosis not present

## 2014-08-06 DIAGNOSIS — R5381 Other malaise: Secondary | ICD-10-CM

## 2014-08-06 NOTE — Therapy (Signed)
[facility] [address] [place], [place], [address] Phone: [phone]   Fax:  [phone]  Physical Therapy Treatment  Patient Details  Name: [name] MRN: [id number] Date of Birth: Apr 15, 1941  Encounter Date: 08/06/2014      PT End of Session - 08/06/14 1148    Visit Number 9   Number of Visits 17   Authorization Type G-code required every 10th visit for Medicare.   PT Start Time 1016   PT Stop Time 1105   PT Time Calculation (min) 49 min   Activity Tolerance Patient tolerated treatment well   Behavior During Therapy WFL for tasks assessed/performed      Past Medical History  Diagnosis Date  . End stage renal disease     on hemodialysis  . Cervical stenosis of spinal canal     s/p C3-C6 laminectomy with Dr. Vertell Limber and Dr. Joya Salm  . Anemia     chronic disease  . Atrial fibrillation     coumadin followed by SNF, longstanding persistent and probably permanent afib  . Aortic stenosis     Echo 02/12/11: moderate aortic stenosis, mean gradient 21,  . Hyperparathyroidism, secondary   . Diabetes mellitus   . Seizure disorder   . Neuropathy     diabetec  . Hypertension   . Hyperkalemia   . BPH (benign prostatic hypertrophy)   . Gout   . History of stroke   . Renal carcinoma     followed at [facility]; s/p gamma knife surgery  . Carotid bruit     Doppler 7/12: Plaque without significant ICA stenosis  . Bradycardia     asymptomatic  . Atrial enlargement, bilateral     Past Surgical History  Procedure Laterality Date  . Foot surgery    . Radiofrequency ablation kidney    . Neck surgery    . Blood clot Bilateral     There were no vitals taken for this visit.  Visit Diagnosis:  Abnormality of gait  Weakness due to cerebrovascular accident  Debility      Subjective Assessment - 08/06/14 1133    Symptoms Pt denies falls or changes.  Expresses frustration over home situation and finances.   Currently in Pain? No/denies                    [facility] Adult PT Treatment/Exercise - 08/06/14 1135    Ambulation/Gait   Ambulation/Gait Yes   Ambulation/Gait Assistance 5: Supervision   Ambulation/Gait Assistance Details (p) Trialed L [place]er AFO with simulated toe cap and 1/4" heel wedge.  Also added <1/2" lift to R shoe to allow for improved foot clearance on L.  Orthotist present for entire session and recommends L Ottobach Reaction AFO with leather cap, new diabetic shoes and lift in R shoe.   Ambulation Distance (Feet) 110 Feet  twice   Assistive device Rollator   Gait Pattern Step-to pattern;Decreased step length - right;Decreased stance time - left;Decreased dorsiflexion - right;Decreased dorsiflexion - left;Left circumduction;Trendelenburg;Trunk flexed;Left genu recurvatum   Knee/Hip Exercises: Aerobic   Stationary Bike trialed foot pedaler but pt unable to keep/position L foot on pedal                PT Education - 08/06/14 1148    Education provided Yes   Education Details Process for obtaining L AFO and Diabetic shoes   Person(s) Educated Patient   Methods Explanation   Comprehension Verbalized understanding  PT Short Term Goals - 07/23/14 1131    PT SHORT TERM GOAL #1   Title Pt will be independent in HEP in order to improve functional mobililty.   Status Achieved   PT SHORT TERM GOAL #2   Title Pt will improve TUG time to </=46 seconds., with LRAD, to decr. falls risk.   Baseline 45.52 seconds with rollator on 07/21/14.   Status Achieved   PT SHORT TERM GOAL #3   Title Pt will improve gait speed to >/= 1.1ft/sec, with LRAD, to improve functional mobility.   Baseline 0.33ft/sec. with rollator   Status Not Met   PT SHORT TERM GOAL #4   Title Pt will ambulate 150' with LRAD, over even terrain, to improve functional mobility.   Status Achieved   PT SHORT TERM GOAL #5   Title Pt will ascend/descent 4 steps without a handrail, with min  A, to improve functional mobility.   Status Not Met   PT SHORT TERM GOAL #7   Title IMprove Berg score from 25/56 up to 30/56 to demo improved steady state standing balance.   Status Not Met           PT Long Term Goals - 07/23/14 1131    PT LONG TERM GOAL #1   Title Pt will be able to verbalize understanding of falls prevention strategies to reduce falls risk.   Time --  08/21/14   Status On-going   PT LONG TERM GOAL #2   Title Pt will improve TUG time, with LRAD, to <40seconds to reduce falls risk.   Time --  08/11/14   Status On-going   PT LONG TERM GOAL #3   Title Pt will improve gait speed >/= 1.73ft/sec. to improve functional mobility.   Baseline Revised base on pt's 0.67ft/sec gait speed on 07/21/14.   Time --  08/11/14   Status On-going   PT LONG TERM GOAL #4   Title Pt will ambulate 300', with LRAD, over even/uneven terrain, at MOD I level to improve functional mobility.   Time --  08/11/14   Status On-going   PT LONG TERM GOAL #5   Title Pt will ascend/descend 4 step withou a handrail, with CGA to improve functional mobility.   Time --  08/11/14   Status On-going   PT LONG TERM GOAL #6   Title Pt's ABC score will improve by 10% to improve quality of life.   Baseline ABC: 20.6%   Time --  08/11/14   Status On-going   PT LONG TERM GOAL #7   Status On-going               Plan - 08/06/14 1149    Clinical Impression Statement Orthotist recommended L Ottobach Reaction (carbon ground reaction brace) with L leather shoe cap and new diabetic shoes with <1/2' lift in R shoe.  Agree with Orthotist recommendation.  Pt will need to obtain appointment for formal foot exam from Dr Deterding per Orthotist.    Pt will benefit from skilled therapeutic intervention in order to improve on the following deficits Abnormal gait;Decreased activity tolerance;Decreased endurance;Decreased coordination;Decreased range of motion;Impaired flexibility;Decreased knowledge of use of  DME;Decreased balance;Decreased mobility;Decreased strength   Rehab Potential Good   PT Frequency 2x / week   PT Duration 8 weeks   PT Treatment/Interventions ADLs/Self Care Home Management;Gait training;Neuromuscular re-education;Stair training;Functional mobility training;Patient/family education;Therapeutic activities;Therapeutic exercise;Manual techniques;DME Instruction;Balance training   PT Next Visit Plan *G-code next visit.  Follow up with patient about  appointment with Dr Jimmy Footman.  Trial foot pedaler from chair and Scifit/Nustep   Consulted and Agree with Plan of Care Patient        Problem List Patient Active Problem List   Diagnosis Date Noted  . Weakness 06/18/2014  . Osteoarthritis, hand 07/23/2012  . Cervical myelopathy 01/19/2012  . Cerebral thrombosis with cerebral infarction 01/19/2012  . Hypotension 07/23/2011  . Sepsis 07/23/2011  . Bradycardia 04/07/2011  . End stage renal disease   . Cervical stenosis of spinal canal   . Atrial fibrillation   . Aortic stenosis   . Diabetes mellitus   . Renal carcinoma   . Carotid bruit   . Hypertension     Narda Bonds 08/06/2014, 12:36 PM  [facility] [address] [place], [place], [address] Phone: [phone]   Fax:  [place], [place] 08/06/2014 12:36 PM Phone: [phone] Fax: [phone]

## 2014-08-11 ENCOUNTER — Ambulatory Visit: Payer: Medicare Other | Admitting: Physical Therapy

## 2014-08-11 ENCOUNTER — Ambulatory Visit: Payer: Medicare Other | Attending: Nephrology

## 2014-08-11 DIAGNOSIS — R5381 Other malaise: Secondary | ICD-10-CM | POA: Diagnosis not present

## 2014-08-11 DIAGNOSIS — E119 Type 2 diabetes mellitus without complications: Secondary | ICD-10-CM | POA: Insufficient documentation

## 2014-08-11 DIAGNOSIS — M479 Spondylosis, unspecified: Secondary | ICD-10-CM | POA: Diagnosis not present

## 2014-08-11 DIAGNOSIS — Z5189 Encounter for other specified aftercare: Secondary | ICD-10-CM | POA: Diagnosis present

## 2014-08-11 DIAGNOSIS — R269 Unspecified abnormalities of gait and mobility: Secondary | ICD-10-CM | POA: Diagnosis not present

## 2014-08-11 DIAGNOSIS — I69898 Other sequelae of other cerebrovascular disease: Secondary | ICD-10-CM | POA: Insufficient documentation

## 2014-08-11 DIAGNOSIS — N186 End stage renal disease: Secondary | ICD-10-CM | POA: Diagnosis not present

## 2014-08-11 DIAGNOSIS — R531 Weakness: Secondary | ICD-10-CM | POA: Diagnosis not present

## 2014-08-11 DIAGNOSIS — IMO0002 Reserved for concepts with insufficient information to code with codable children: Secondary | ICD-10-CM

## 2014-08-11 NOTE — Patient Instructions (Signed)

## 2014-08-11 NOTE — Therapy (Signed)
[facility] [address] [place], [place], [address] Phone: [phone]   Fax:  [phone]  Physical Therapy Treatment  Patient Details  Name: [name] MRN: [id number] Date of Birth: 18-May-1941  Encounter Date: 08/11/2014      PT End of Session - 08/11/14 1210    Visit Number 10   Number of Visits 17   Authorization Type G-code required every 10th visit for Medicare.   PT Start Time 1015   PT Stop Time 1057   PT Time Calculation (min) 42 min   Equipment Utilized During Treatment Gait belt   Activity Tolerance Patient tolerated treatment well   Behavior During Therapy WFL for tasks assessed/performed      Past Medical History  Diagnosis Date  . End stage renal disease     on hemodialysis  . Cervical stenosis of spinal canal     s/p C3-C6 laminectomy with Dr. Vertell Limber and Dr. Joya Salm  . Anemia     chronic disease  . Atrial fibrillation     coumadin followed by SNF, longstanding persistent and probably permanent afib  . Aortic stenosis     Echo 02/12/11: moderate aortic stenosis, mean gradient 21,  . Hyperparathyroidism, secondary   . Diabetes mellitus   . Seizure disorder   . Neuropathy     diabetec  . Hypertension   . Hyperkalemia   . BPH (benign prostatic hypertrophy)   . Gout   . History of stroke   . Renal carcinoma     followed at [facility]; s/p gamma knife surgery  . Carotid bruit     Doppler 7/12: Plaque without significant ICA stenosis  . Bradycardia     asymptomatic  . Atrial enlargement, bilateral     Past Surgical History  Procedure Laterality Date  . Foot surgery    . Radiofrequency ablation kidney    . Neck surgery    . Blood clot Bilateral     There were no vitals taken for this visit.  Visit Diagnosis:  Abnormality of gait  Weakness due to cerebrovascular accident  Debility      Subjective Assessment - 08/11/14 1019    Symptoms Pt denied falls or changes.    Patient  Stated Goals To get physically stronger, not fall, and walk longer distances.   Currently in Pain? No/denies                    [facility] Adult PT Treatment/Exercise - 08/11/14 1021    Ambulation/Gait   Ambulation/Gait Yes   Ambulation/Gait Assistance 6: Modified independent (Device/Increase time)   Ambulation/Gait Assistance Details Pt ambulated over even terrain, while performing head turns. Pt continues to rely heavily on B UEs to improve L foot clearance due to decrease L hip flexion, knee flexion and dorsiflexion. VC's to improve L foot clearance. Pt demonstrated improved posture.   Ambulation Distance (Feet) --  75'x2, 100', 50'   Assistive device Rollator   Gait Pattern Step-to pattern;Decreased step length - right;Decreased stance time - left;Decreased dorsiflexion - right;Decreased dorsiflexion - left;Left circumduction;Trendelenburg;Trunk flexed;Left genu recurvatum   Gait velocity 1.19f/sec.  29.02 sec. with rollator   Standardized Balance Assessment   Standardized Balance Assessment Timed Up and Go Test   Timed Up and Go Test   TUG Normal TUG   Normal TUG (seconds) 31.44  with rollator          Self Care:     PT Education - 08/11/14 1206  Education provided Yes   Education Details (Self care): PT educated pt on [employer], in regards to managing diabetes and performing a home evaluation as pt is frustrated with his current living situation. PT also informed pt he will need to set up an appt. with podiatrist for foot exam to obtain diabetic shoe; PT provided pt a note explaining why he needs the exam. PT educated pt on falls prevention strategies handout to reduce risk of falls.   Person(s) Educated Patient   Methods Explanation;Demonstration;Handout   Comprehension Verbalized understanding          PT Short Term Goals - 07/23/14 1131    PT SHORT TERM GOAL #1   Title Pt will be independent in HEP in order to improve  functional mobililty.   Status Achieved   PT SHORT TERM GOAL #2   Title Pt will improve TUG time to </=46 seconds., with LRAD, to decr. falls risk.   Baseline 45.52 seconds with rollator on 07/21/14.   Status Achieved   PT SHORT TERM GOAL #3   Title Pt will improve gait speed to >/= 1.63f/sec, with LRAD, to improve functional mobility.   Baseline 0.817fsec. with rollator   Status Not Met   PT SHORT TERM GOAL #4   Title Pt will ambulate 150' with LRAD, over even terrain, to improve functional mobility.   Status Achieved   PT SHORT TERM GOAL #5   Title Pt will ascend/descent 4 steps without a handrail, with min A, to improve functional mobility.   Status Not Met   PT SHORT TERM GOAL #7   Title IMprove Berg score from 25/56 up to 30/56 to demo improved steady state standing balance.   Status Not Met           PT Long Term Goals - 08/11/14 1213    PT LONG TERM GOAL #1   Title Pt will be able to verbalize understanding of falls prevention strategies to reduce falls risk.   Time --  08/11/14   Status On-going   PT LONG TERM GOAL #2   Title Pt will improve TUG time, with LRAD, to <40seconds to reduce falls risk.   Baseline 31.44 sec.   Time --  08/11/14   Status Achieved   PT LONG TERM GOAL #3   Title Pt will improve gait speed >/= 1.59f24fec. to improve functional mobility.   Baseline 1.12f42fc. with rollator.   Time --  08/11/14   Status Achieved   PT LONG TERM GOAL #4   Title Pt will ambulate 300', with LRAD, over even/uneven terrain, at MOD I level to improve functional mobility.   Time --  08/11/14   Status On-going   PT LONG TERM GOAL #5   Title Pt will ascend/descend 4 step withou a handrail, with CGA to improve functional mobility.   Time --  08/11/14   Status On-going   PT LONG TERM GOAL #6   Title Pt's ABC score will improve by 10% to improve quality of life.   Baseline ABC: 20.6%   Time --  08/11/14   Status On-going   PT LONG TERM GOAL #7   Title Improve Berg  score from 25/56 up to 34/56 to demo decreasing risk for falls.   Baseline due 08/11/2014   Status On-going               Plan - 08/11/14 1211    Clinical Impression Statement Pt demonstrated progress, as he met LTGs #  2(TUG) and #3(gait speed), will assess remaining goals next visit. Pt continues to experience decreased L foot clearance, which should improve with new L AFO. Continue with POC.   Pt will benefit from skilled therapeutic intervention in order to improve on the following deficits Abnormal gait;Decreased activity tolerance;Decreased endurance;Decreased coordination;Decreased range of motion;Impaired flexibility;Decreased knowledge of use of DME;Decreased balance;Decreased mobility;Decreased strength   PT Frequency 2x / week   PT Duration 8 weeks   PT Treatment/Interventions ADLs/Self Care Home Management;Gait training;Neuromuscular re-education;Stair training;Functional mobility training;Patient/family education;Therapeutic activities;Therapeutic exercise;Manual techniques;DME Instruction;Balance training   PT Next Visit Plan D/C, check remaining LTGs, G-code.   Consulted and Agree with Plan of Care Patient          G-Codes - Aug 29, 2014 1214    Functional Assessment Tool Used TUG with rollator: 31.44 sec; gait speed with rollator: 1.65f/sec.   Functional Limitation Mobility: Walking and moving around   Mobility: Walking and Moving Around Current Status [phone] At least 20 percent but less than 40 percent impaired, limited or restricted   Mobility: Walking and Moving Around Goal Status [phone] At least 20 percent but less than 40 percent impaired, limited or restricted      Problem List Patient Active Problem List   Diagnosis Date Noted  . Weakness 06/18/2014  . Osteoarthritis, hand 07/23/2012  . Cervical myelopathy 01/19/2012  . Cerebral thrombosis with cerebral infarction 01/19/2012  . Hypotension 07/23/2011  . Sepsis 07/23/2011  . Bradycardia 04/07/2011  . End  stage renal disease   . Cervical stenosis of spinal canal   . Atrial fibrillation   . Aortic stenosis   . Diabetes mellitus   . Renal carcinoma   . Carotid bruit   . Hypertension     Kimberleigh Mehan L 1January 23, 2016 12:17 PM  [place] [address]Phone: [phone]  Fax:  [phone]    JGeoffry Paradise PT,DPT 023-Jan-201612:17 PM Phone: [phone]: [phone]

## 2014-08-13 ENCOUNTER — Encounter: Payer: Self-pay | Admitting: Physical Therapy

## 2014-08-13 ENCOUNTER — Ambulatory Visit: Payer: Medicare Other | Admitting: Physical Therapy

## 2014-08-13 DIAGNOSIS — R5381 Other malaise: Secondary | ICD-10-CM

## 2014-08-13 DIAGNOSIS — R269 Unspecified abnormalities of gait and mobility: Secondary | ICD-10-CM

## 2014-08-13 DIAGNOSIS — Z5189 Encounter for other specified aftercare: Secondary | ICD-10-CM | POA: Diagnosis not present

## 2014-08-13 DIAGNOSIS — IMO0002 Reserved for concepts with insufficient information to code with codable children: Secondary | ICD-10-CM

## 2014-08-13 NOTE — Patient Instructions (Signed)
It is important to avoid accidents which may result in broken bones.  Here are a few ideas on how to make your home safer so you will be less likely to trip or fall.  1. Use nonskid mats or non slip strips in your shower or tub, on your bathroom floor and around sinks.  If you know that you have spilled water, wipe it up! 2. In the bathroom, it is important to have properly installed grab bars on the walls or on the edge of the tub.  Towel racks are NOT strong enough for you to hold onto or to pull on for support. 3. Stairs and hallways should have enough light.  Add lamps or night lights if you need ore light. 4. It is good to have handrails on both sides of the stairs if possible.  Always fix broken handrails right away. 5. It is important to see the edges of steps.  Paint the edges of outdoor steps white so you can see them better.  Put colored tape on the edge of inside steps. 6. Throw-rugs are dangerous because they can slide.  Removing the rugs is the best idea, but if they must stay, add adhesive carpet tape to prevent slipping. 7. Do not keep things on stairs or in the halls.  Remove small furniture that blocks the halls as it may cause you to trip.  Keep telephone and electrical cords out of the way where you walk. 8. Always were sturdy, rubber-soled shoes for good support.  Never wear just socks, especially on the stairs.  Socks may cause you to slip or fall.  Do not wear full-length housecoats as you can easily trip on the bottom.  9. Place the things you use the most on the shelves that are the easiest to reach.  If you use a stepstool, make sure it is in good condition.  If you feel unsteady, DO NOT climb, ask for help. If a health professional advises you to use a cane or walker, do not be ashamed.  These items can keep you from falling and breaking your bones.Fall Prevention and Home Safety Falls cause injuries and can affect all age groups. It is possible to use preventive measures to  significantly decrease the likelihood of falls. There are many simple measures which can make your home safer and prevent falls. OUTDOORS 10. Repair cracks and edges of walkways and driveways. 11. Remove high doorway thresholds. 12. Trim shrubbery on the main path into your home. 13. Have good outside lighting. 14. Clear walkways of tools, rocks, debris, and clutter. 15. Check that handrails are not broken and are securely fastened. Both sides of steps should have handrails. 16. Have leaves, snow, and ice cleared regularly. 17. Use sand or salt on walkways during winter months. 18. In the garage, clean up grease or oil spills. BATHROOM  Install night lights.  Install grab bars by the toilet and in the tub and shower.  Use non-skid mats or decals in the tub or shower.  Place a plastic non-slip stool in the shower to sit on, if needed.  Keep floors dry and clean up all water on the floor immediately.  Remove soap buildup in the tub or shower on a regular basis.  Secure bath mats with non-slip, double-sided rug tape.  Remove throw rugs and tripping hazards from the floors. BEDROOMS  Install night lights.  Make sure a bedside light is easy to reach.  Do not use oversized bedding.  Keep  a telephone by your bedside.  Have a firm chair with side arms to use for getting dressed.  Remove throw rugs and tripping hazards from the floor. KITCHEN  Keep handles on pots and pans turned toward the center of the stove. Use back burners when possible.  Clean up spills quickly and allow time for drying.  Avoid walking on wet floors.  Avoid hot utensils and knives.  Position shelves so they are not too high or low.  Place commonly used objects within easy reach.  If necessary, use a sturdy step stool with a grab bar when reaching.  Keep electrical cables out of the way.  Do not use floor polish or wax that makes floors slippery. If you must use wax, use non-skid floor  wax.  Remove throw rugs and tripping hazards from the floor. STAIRWAYS  Never leave objects on stairs.  Place handrails on both sides of stairways and use them. Fix any loose handrails. Make sure handrails on both sides of the stairways are as long as the stairs.  Check carpeting to make sure it is firmly attached along stairs. Make repairs to worn or loose carpet promptly.  Avoid placing throw rugs at the top or bottom of stairways, or properly secure the rug with carpet tape to prevent slippage. Get rid of throw rugs, if possible.  Have an electrician put in a light switch at the top and bottom of the stairs. OTHER FALL PREVENTION TIPS  Wear low-heel or rubber-soled shoes that are supportive and fit well. Wear closed toe shoes.  When using a stepladder, make sure it is fully opened and both spreaders are firmly locked. Do not climb a closed stepladder.  Add color or contrast paint or tape to grab bars and handrails in your home. Place contrasting color strips on first and last steps.  Learn and use mobility aids as needed. Install an electrical emergency response system.  Turn on lights to avoid dark areas. Replace light bulbs that burn out immediately. Get light switches that glow.  Arrange furniture to create clear pathways. Keep furniture in the same place.  Firmly attach carpet with non-skid or double-sided tape.  Eliminate uneven floor surfaces.  Select a carpet pattern that does not visually hide the edge of steps.  Be aware of all pets. OTHER HOME SAFETY TIPS  Set the water temperature for 120 F (48.8 C).  Keep emergency numbers on or near the telephone.  Keep smoke detectors on every level of the home and near sleeping areas. Document Released: 07/14/2002 Document Revised: 01/23/2012 Document Reviewed: 10/13/2011 [facility] Patient Information 2015 [place], [place]. This information is not intended to replace advice given to you by your health care provider. Make  sure you discuss any questions you have with your health care provider.

## 2014-08-13 NOTE — Therapy (Signed)
[facility] [address] [place], [place], [address] Phone: [phone]   Fax:  [phone]  Physical Therapy Treatment  Patient Details  Name: [name] MRN: [id number] Date of Birth: Feb 08, 1941 Referring Provider:  Antony Blackbird, MD  Encounter Date: 08/13/2014      PT End of Session - 08/13/14 1307    Visit Number 11   Number of Visits 17   Authorization Type G-code required every 10th visit for Medicare.   PT Start Time 1016   PT Stop Time 1100   PT Time Calculation (min) 44 min   Activity Tolerance Patient tolerated treatment well   Behavior During Therapy WFL for tasks assessed/performed      Past Medical History  Diagnosis Date  . End stage renal disease     on hemodialysis  . Cervical stenosis of spinal canal     s/p C3-C6 laminectomy with Dr. Vertell Limber and Dr. Joya Salm  . Anemia     chronic disease  . Atrial fibrillation     coumadin followed by SNF, longstanding persistent and probably permanent afib  . Aortic stenosis     Echo 02/12/11: moderate aortic stenosis, mean gradient 21,  . Hyperparathyroidism, secondary   . Diabetes mellitus   . Seizure disorder   . Neuropathy     diabetec  . Hypertension   . Hyperkalemia   . BPH (benign prostatic hypertrophy)   . Gout   . History of stroke   . Renal carcinoma     followed at [facility]; s/p gamma knife surgery  . Carotid bruit     Doppler 7/12: Plaque without significant ICA stenosis  . Bradycardia     asymptomatic  . Atrial enlargement, bilateral     Past Surgical History  Procedure Laterality Date  . Foot surgery    . Radiofrequency ablation kidney    . Neck surgery    . Blood clot Bilateral     There were no vitals taken for this visit.  Visit Diagnosis:  Abnormality of gait  Weakness due to cerebrovascular accident  Debility      Subjective Assessment - 08/13/14 1025    Symptoms Pt denies changes or falls   Currently in Pain?  No/denies                    [facility] Adult PT Treatment/Exercise - 08/13/14 1027    Transfers   Transfers Sit to Stand;Stand to Sit   Sit to Stand 6: Modified independent (Device/Increase time);With upper extremity assist;With armrests   Stand to Sit 6: Modified independent (Device/Increase time);With upper extremity assist;With armrests   Ambulation/Gait   Ambulation/Gait Yes   Ambulation/Gait Assistance 6: Modified independent (Device/Increase time)   Ambulation Distance (Feet) 100 Feet  three times   Assistive device Rollator   Gait Pattern Step-to pattern;Decreased step length - right;Decreased stance time - left;Decreased dorsiflexion - right;Decreased dorsiflexion - left;Left circumduction;Trendelenburg;Trunk flexed;Left genu recurvatum   Berg Balance Test   Sit to Stand Able to stand  independently using hands   Standing Unsupported Able to stand safely 2 minutes   Sitting with Back Unsupported but Feet Supported on Floor or Stool Able to sit safely and securely 2 minutes   Stand to Sit Sits safely with minimal use of hands   Transfers Able to transfer safely, definite need of hands   Standing Unsupported with Eyes Closed Able to stand 10 seconds safely   Standing Ubsupported with Feet Together Able to  place feet together independently and stand for 1 minute with supervision   From Standing, Reach Forward with Outstretched Arm Can reach confidently >25 cm (10")   From Standing Position, Pick up Object from [place] to pick up shoe, needs supervision   From Standing Position, Turn to Look Behind Over each Shoulder Turn sideways only but maintains balance   Turn 360 Degrees Needs close supervision or verbal cueing   Standing Unsupported, Alternately Place Feet on Step/Stool Needs assistance to keep from falling or unable to try   Standing Unsupported, One Foot in [place] to take small step independently and hold 30 seconds   Standing on One Leg Unable to try or needs  assist to prevent fall   Total Score 37   Knee/Hip Exercises: Aerobic   Stationary Bike Nustep level 4 all 4 extremities x 5 minutes with LLE strapped on foot pedal                PT Education - 08/13/14 1101    Education provided Yes   Education Details Fall Prevention strategies,  possible OPPT again once brace/shoes arrive.   Person(s) Educated Patient   Methods Explanation;Demonstration;Handout   Comprehension Verbalized understanding          PT Short Term Goals - 07/23/14 1131    PT SHORT TERM GOAL #1   Title Pt will be independent in HEP in order to improve functional mobililty.   Status Achieved   PT SHORT TERM GOAL #2   Title Pt will improve TUG time to </=46 seconds., with LRAD, to decr. falls risk.   Baseline 45.52 seconds with rollator on 07/21/14.   Status Achieved   PT SHORT TERM GOAL #3   Title Pt will improve gait speed to >/= 1.9f/sec, with LRAD, to improve functional mobility.   Baseline 0.814fsec. with rollator   Status Not Met   PT SHORT TERM GOAL #4   Title Pt will ambulate 150' with LRAD, over even terrain, to improve functional mobility.   Status Achieved   PT SHORT TERM GOAL #5   Title Pt will ascend/descent 4 steps without a handrail, with min A, to improve functional mobility.   Status Not Met   PT SHORT TERM GOAL #7   Title IMprove Berg score from 25/56 up to 30/56 to demo improved steady state standing balance.   Status Not Met           PT Long Term Goals - 08/13/14 1316    PT LONG TERM GOAL #1   Title Pt will be able to verbalize understanding of falls prevention strategies to reduce falls risk.   Status Achieved   PT LONG TERM GOAL #2   Title Pt will improve TUG time, with LRAD, to <40seconds to reduce falls risk.   Status Achieved   PT LONG TERM GOAL #3   Title Pt will improve gait speed >/= 1.16f73fec. to improve functional mobility.   Status Achieved   PT LONG TERM GOAL #4   Title Pt will ambulate 300', with LRAD, over  even/uneven terrain, at MOD I level to improve functional mobility.   Status Not Met   PT LONG TERM GOAL #5   Title Pt will ascend/descend 4 step without a handrail, with CGA to improve functional mobility.   Status Not Met   PT LONG TERM GOAL #6   Title Pt's ABC score will improve by 10% to improve quality of life.   Status Achieved   PT  LONG TERM GOAL #7   Title Improve Berg score from 25/56 up to 34/56 to demo decreasing risk for falls.   Status Achieved               Plan - 2014-08-27 1308    Clinical Impression Statement Discharge pt per Geoffry Paradise, PT.  Goal #1, 6 and 7 met. Goal # 4 unmet for distance secondary to awaiting new AFO and 5 unmet per patient report.   Pt will benefit from skilled therapeutic intervention in order to improve on the following deficits Abnormal gait;Decreased activity tolerance;Decreased endurance;Decreased coordination;Decreased range of motion;Impaired flexibility;Decreased knowledge of use of DME;Decreased balance;Decreased mobility;Decreased strength          G-Codes - 2014/08/27 1318    Functional Assessment Tool Used TUG with rollator: 31.44 sec; gait speed with rollator: 1.4f/sec.   Functional Limitation Mobility: Walking and moving around   Mobility: Walking and Moving Around Goal Status [phone] At least 20 percent but less than 40 percent impaired, limited or restricted   Mobility: Walking and Moving Around Discharge Status [phone] At least 20 percent but less than 40 percent impaired, limited or restricted      Problem List Patient Active Problem List   Diagnosis Date Noted  . Weakness 06/18/2014  . Osteoarthritis, hand 07/23/2012  . Cervical myelopathy 01/19/2012  . Cerebral thrombosis with cerebral infarction 01/19/2012  . Hypotension 07/23/2011  . Sepsis 07/23/2011  . Bradycardia 04/07/2011  . End stage renal disease   . Cervical stenosis of spinal canal   . Atrial fibrillation   . Aortic stenosis   . Diabetes  mellitus   . Renal carcinoma   . Carotid bruit   . Hypertension     Miller,Jennifer L 1Jan 21, 2016 1:31 PM  [place] [address]Phone: [phone]  Fax:  3[place]0January 21, 20161:31 PM Phone: [phone]: [phone] G-code and discharge summary completed by PT.  PHYSICAL THERAPY DISCHARGE SUMMARY  Visits from Start of Care: 11  Current functional level related to goals / functional outcomes:     PT Long Term Goals - 02016/01/211316    PT LONG TERM GOAL #1   Title Pt will be able to verbalize understanding of falls prevention strategies to reduce falls risk.   Status Achieved   PT LONG TERM GOAL #2   Title Pt will improve TUG time, with LRAD, to <40seconds to reduce falls risk.   Status Achieved   PT LONG TERM GOAL #3   Title Pt will improve gait speed >/= 1.123fsec. to improve functional mobility.   Status Achieved   PT LONG TERM GOAL #4   Title Pt will ambulate 300', with LRAD, over even/uneven terrain, at MOD I level to improve functional mobility.   Status Not Met   PT LONG TERM GOAL #5   Title Pt will ascend/descend 4 step without a handrail, with CGA to improve functional mobility.   Status Not Met   PT LONG TERM GOAL #6   Title Pt's ABC score will improve by 10% to improve quality of life.   Status Achieved   PT LONG TERM GOAL #7   Title Improve Berg score from 25/56 up to 34/56 to demo decreasing risk for falls.   Status Achieved       Remaining deficits: Difficulty ambulating long distances, as pt awaiting new AFO. L LE  weakness.   Education / Equipment: HEP and new AFO fitting.  Plan: Patient agrees to discharge.  Patient goals were partially met. Patient is being discharged due to meeting the stated rehab goals.  ?????         Geoffry Paradise, PT,DPT 08/13/2014  1:34 PM Phone: [phone] Fax: [phone]

## 2014-08-18 ENCOUNTER — Encounter: Payer: Medicare Other | Admitting: Physical Therapy

## 2014-08-31 ENCOUNTER — Telehealth: Payer: Self-pay | Admitting: Podiatry

## 2014-08-31 NOTE — Telephone Encounter (Signed)
Patient son came into see if [name] was in the process of getting diabetic shoes because they have not heard anything from [place]. Please advise.

## 2014-09-02 ENCOUNTER — Telehealth: Payer: Self-pay | Admitting: *Deleted

## 2014-09-02 ENCOUNTER — Encounter: Payer: Self-pay | Admitting: *Deleted

## 2014-09-02 NOTE — Telephone Encounter (Signed)
Called patient to reschedule no answer left a message to call office back to reschedule.

## 2014-09-09 NOTE — Telephone Encounter (Signed)
Tired calling patient to reschedule no answer.

## 2014-09-17 ENCOUNTER — Telehealth: Payer: Self-pay

## 2014-09-17 NOTE — Telephone Encounter (Signed)
Dr. Margarita Rana referred Mr. [name] to PT in 06/2014. We saw him and recommended a L [place] reaction AFO, along with a leather cap for his shoe, in order to ambulate in a safe manner. His current double upright AFO is too heavy and rubs against his skin.  Mr. Rosenzweig daughter called our office regarding the brace and would like to move forward with the process of obtaining the brace. I sent a note in January requesting a prescription for the L AFO, please send [place] the AFO prescription if you agree with my assessment.   Thank you, Geoffry Paradise, PT, DPT

## 2014-09-17 NOTE — Telephone Encounter (Signed)
PT returned a call from pt's daughter, [name]. She was not home, so PT left a message with family member to let [name] know I returned her call regarding [name].

## 2014-09-22 ENCOUNTER — Telehealth: Payer: Self-pay

## 2014-09-22 NOTE — Telephone Encounter (Signed)
PT spoke to pt's daughter regarding ordering AFO from [facility]. PT sent an additional note to Dr. Chapman Fitch (pt's PCP), requesting AFO and leather shoe cap, as Dr. Posey Pronto has not sent [place] a prescription.

## 2014-09-24 ENCOUNTER — Telehealth: Payer: Self-pay

## 2014-09-24 NOTE — Telephone Encounter (Signed)
PT left message for pt and pt's daughter [name]) regarding AFO. Pt needs to sign a medical release form to allow PT to fax [facility] pt's PT notes. Gregary Signs from [facility] stated they need the PT notes in order for Medicare to justify payment for the AFO and shoe cap. PT has also obtained signed prescription from Dr.  Chapman Fitch for the AFO.

## 2014-10-12 ENCOUNTER — Ambulatory Visit (INDEPENDENT_AMBULATORY_CARE_PROVIDER_SITE_OTHER): Payer: Medicare Other | Admitting: Podiatry

## 2014-10-12 ENCOUNTER — Encounter: Payer: Self-pay | Admitting: Podiatry

## 2014-10-12 DIAGNOSIS — L608 Other nail disorders: Secondary | ICD-10-CM | POA: Diagnosis not present

## 2014-10-12 DIAGNOSIS — E1140 Type 2 diabetes mellitus with diabetic neuropathy, unspecified: Secondary | ICD-10-CM

## 2014-10-12 DIAGNOSIS — L84 Corns and callosities: Secondary | ICD-10-CM

## 2014-10-12 NOTE — Patient Instructions (Signed)
Diabetes and Foot Care Diabetes may cause you to have problems because of poor blood supply (circulation) to your feet and legs. This may cause the skin on your feet to become thinner, break easier, and heal more slowly. Your skin may become dry, and the skin may peel and crack. You may also have nerve damage in your legs and feet causing decreased feeling in them. You may not notice minor injuries to your feet that could lead to infections or more serious problems. Taking care of your feet is one of the most important things you can do for yourself.  HOME CARE INSTRUCTIONS  Wear shoes at all times, even in the house. Do not go barefoot. Bare feet are easily injured.  Check your feet daily for blisters, cuts, and redness. If you cannot see the bottom of your feet, use a mirror or ask someone for help.  Wash your feet with warm water (do not use hot water) and mild soap. Then pat your feet and the areas between your toes until they are completely dry. Do not soak your feet as this can dry your skin.  Apply a moisturizing lotion or petroleum jelly (that does not contain alcohol and is unscented) to the skin on your feet and to dry, brittle toenails. Do not apply lotion between your toes.  Trim your toenails straight across. Do not dig under them or around the cuticle. File the edges of your nails with an emery board or nail file.  Do not cut corns or calluses or try to remove them with medicine.  Wear clean socks or stockings every day. Make sure they are not too tight. Do not wear knee-high stockings since they may decrease blood flow to your legs.  Wear shoes that fit properly and have enough cushioning. To break in new shoes, wear them for just a few hours a day. This prevents you from injuring your feet. Always look in your shoes before you put them on to be sure there are no objects inside.  Do not cross your legs. This may decrease the blood flow to your feet.  If you find a minor scrape,  cut, or break in the skin on your feet, keep it and the skin around it clean and dry. These areas may be cleansed with mild soap and water. Do not cleanse the area with peroxide, alcohol, or iodine.  When you remove an adhesive bandage, be sure not to damage the skin around it.  If you have a wound, look at it several times a day to make sure it is healing.  Do not use heating pads or hot water bottles. They may burn your skin. If you have lost feeling in your feet or legs, you may not know it is happening until it is too late.  Make sure your health care provider performs a complete foot exam at least annually or more often if you have foot problems. Report any cuts, sores, or bruises to your health care provider immediately. SEEK MEDICAL CARE IF:   You have an injury that is not healing.  You have cuts or breaks in the skin.  You have an ingrown nail.  You notice redness on your legs or feet.  You feel burning or tingling in your legs or feet.  You have pain or cramps in your legs and feet.  Your legs or feet are numb.  Your feet always feel cold. SEEK IMMEDIATE MEDICAL CARE IF:   There is increasing redness,   swelling, or pain in or around a wound.  There is a red line that goes up your leg.  Pus is coming from a wound.  You develop a fever or as directed by your health care provider.  You notice a bad smell coming from an ulcer or wound. Document Released: 07/21/2000 Document Revised: 03/26/2013 Document Reviewed: 12/31/2012 ExitCare Patient Information 2015 [employer]. This information is not intended to replace advice given to you by your health care provider. Make sure you discuss any questions you have with your health care provider.  

## 2014-10-13 NOTE — Progress Notes (Signed)
Patient ID: [name], male   DOB: 07-07-1941, 74 y.o.   MRN: [id number]  Subjective: This patient presents today requesting debridement of elongated toenails  Objective: Incurvated toenails with normal trophic texture 10 Minimal keratoses right hallux  Assessment: Incurvated toenails non-dystrophic 6-10 History of diabetic peripheral neuropathy Keratoses 1  Plan: Debridement of toenails 10 (non-dystrophic) without any bleeding Debrided keratoses 1 without a bleeding

## 2014-10-22 ENCOUNTER — Ambulatory Visit: Payer: Medicare Other | Admitting: Neurology

## 2014-11-03 ENCOUNTER — Ambulatory Visit: Payer: Medicare Other | Admitting: Neurology

## 2015-01-18 ENCOUNTER — Ambulatory Visit: Payer: Medicare Other | Admitting: Podiatry

## 2015-01-27 ENCOUNTER — Ambulatory Visit (INDEPENDENT_AMBULATORY_CARE_PROVIDER_SITE_OTHER): Payer: Medicare Other | Admitting: Podiatry

## 2015-01-27 ENCOUNTER — Encounter: Payer: Self-pay | Admitting: Podiatry

## 2015-01-27 VITALS — BP 154/47 | HR 48 | Resp 14

## 2015-01-27 DIAGNOSIS — L608 Other nail disorders: Secondary | ICD-10-CM

## 2015-01-27 DIAGNOSIS — E1140 Type 2 diabetes mellitus with diabetic neuropathy, unspecified: Secondary | ICD-10-CM | POA: Diagnosis not present

## 2015-01-27 DIAGNOSIS — L609 Nail disorder, unspecified: Secondary | ICD-10-CM

## 2015-01-27 DIAGNOSIS — L84 Corns and callosities: Secondary | ICD-10-CM

## 2015-01-27 NOTE — Patient Instructions (Signed)
Diabetes and Foot Care Diabetes may cause you to have problems because of poor blood supply (circulation) to your feet and legs. This may cause the skin on your feet to become thinner, break easier, and heal more slowly. Your skin may become dry, and the skin may peel and crack. You may also have nerve damage in your legs and feet causing decreased feeling in them. You may not notice minor injuries to your feet that could lead to infections or more serious problems. Taking care of your feet is one of the most important things you can do for yourself.  HOME CARE INSTRUCTIONS  Wear shoes at all times, even in the house. Do not go barefoot. Bare feet are easily injured.  Check your feet daily for blisters, cuts, and redness. If you cannot see the bottom of your feet, use a mirror or ask someone for help.  Wash your feet with warm water (do not use hot water) and mild soap. Then pat your feet and the areas between your toes until they are completely dry. Do not soak your feet as this can dry your skin.  Apply a moisturizing lotion or petroleum jelly (that does not contain alcohol and is unscented) to the skin on your feet and to dry, brittle toenails. Do not apply lotion between your toes.  Trim your toenails straight across. Do not dig under them or around the cuticle. File the edges of your nails with an emery board or nail file.  Do not cut corns or calluses or try to remove them with medicine.  Wear clean socks or stockings every day. Make sure they are not too tight. Do not wear knee-high stockings since they may decrease blood flow to your legs.  Wear shoes that fit properly and have enough cushioning. To break in new shoes, wear them for just a few hours a day. This prevents you from injuring your feet. Always look in your shoes before you put them on to be sure there are no objects inside.  Do not cross your legs. This may decrease the blood flow to your feet.  If you find a minor scrape,  cut, or break in the skin on your feet, keep it and the skin around it clean and dry. These areas may be cleansed with mild soap and water. Do not cleanse the area with peroxide, alcohol, or iodine.  When you remove an adhesive bandage, be sure not to damage the skin around it.  If you have a wound, look at it several times a day to make sure it is healing.  Do not use heating pads or hot water bottles. They may burn your skin. If you have lost feeling in your feet or legs, you may not know it is happening until it is too late.  Make sure your health care provider performs a complete foot exam at least annually or more often if you have foot problems. Report any cuts, sores, or bruises to your health care provider immediately. SEEK MEDICAL CARE IF:   You have an injury that is not healing.  You have cuts or breaks in the skin.  You have an ingrown nail.  You notice redness on your legs or feet.  You feel burning or tingling in your legs or feet.  You have pain or cramps in your legs and feet.  Your legs or feet are numb.  Your feet always feel cold. SEEK IMMEDIATE MEDICAL CARE IF:   There is increasing redness,   swelling, or pain in or around a wound.  There is a red line that goes up your leg.  Pus is coming from a wound.  You develop a fever or as directed by your health care provider.  You notice a bad smell coming from an ulcer or wound. Document Released: 07/21/2000 Document Revised: 03/26/2013 Document Reviewed: 12/31/2012 ExitCare Patient Information 2015 [employer]. This information is not intended to replace advice given to you by your health care provider. Make sure you discuss any questions you have with your health care provider.  

## 2015-01-28 NOTE — Progress Notes (Signed)
Patient ID: [name], male   DOB: 1941/03/11, 74 y.o.   MRN: [id number]  Subjective: Patient presents today complaining of painful toenails and keratoses  Objective: Incurvated toenails, elongated normal trophic 6-10 Keratoses hallux bilaterally  Assessment: Diabetic with peripheral neuropathy Incurvated toenails non-dystrophic 6-10 Keratoses 2  Plan: Debridement of toenails 10 (non-dystrophic) without a bleeding Debridement of keratoses 2 without any bleeding  Reappoint 3 months

## 2015-02-10 ENCOUNTER — Telehealth: Payer: Self-pay | Admitting: Internal Medicine

## 2015-02-10 NOTE — Telephone Encounter (Signed)
Pt scheduled to see Tye Savoy NP tomorrow at 2:30pm. Lenna Sciara at [facility] to notify pt of appt and fax records.

## 2015-02-11 ENCOUNTER — Ambulatory Visit (INDEPENDENT_AMBULATORY_CARE_PROVIDER_SITE_OTHER): Payer: Medicare Other | Admitting: Nurse Practitioner

## 2015-02-11 ENCOUNTER — Encounter: Payer: Self-pay | Admitting: Nurse Practitioner

## 2015-02-11 VITALS — BP 106/52 | HR 99 | Ht 66.0 in | Wt 161.0 lb

## 2015-02-11 DIAGNOSIS — R195 Other fecal abnormalities: Secondary | ICD-10-CM

## 2015-02-11 DIAGNOSIS — D649 Anemia, unspecified: Secondary | ICD-10-CM | POA: Diagnosis not present

## 2015-02-11 NOTE — Progress Notes (Addendum)
HPI :   Patient is a 74 year old male with multiple medical problems He is referred by Nephrology for Hemoccult-positive stool on Coumadin. His hemoglobin dropped from 9.2-8.7. Coag studies not in Epic so I do not know recent INR.   Patient has not had any overt bleeding but does recall that stools a little darker than normal since July 4. Also, he has been slightly constipated since July 4 but no other GI symptoms. No abdominal pain. No nausea. No NSAID use. Patient recalls having a colonoscopy with removal of polyps in [place] in 2010.  Patient thinks he had a colonoscopy 3 years ago at [facility] during workup for renal transplant.     Past Medical History  Diagnosis Date  . End stage renal disease     on hemodialysis  . Cervical stenosis of spinal canal     s/p C3-C6 laminectomy with Dr. Vertell Limber and Dr. Joya Salm  . Anemia     chronic disease  . Atrial fibrillation     coumadin followed by SNF, longstanding persistent and probably permanent afib  . Aortic stenosis     Echo 02/12/11: moderate aortic stenosis, mean gradient 21,  . Hyperparathyroidism, secondary   . Diabetes mellitus   . Seizure disorder   . Neuropathy     diabetec  . Hypertension   . Hyperkalemia   . BPH (benign prostatic hypertrophy)   . Gout   . History of stroke   . Renal carcinoma     followed at [facility]; s/p gamma knife surgery  . Carotid bruit     Doppler 7/12: Plaque without significant ICA stenosis  . Bradycardia     asymptomatic  . Atrial enlargement, bilateral     Family History  Problem Relation Age of Onset  . Diabetes Mother   . Heart disease Mother   . Hyperlipidemia Mother   . Hypertension Mother   . Diabetes Father   . Hyperlipidemia Father   . Hypertension Father   . Diabetes Sister   . Kidney disease Sister   . Diabetes Brother   . Kidney disease Brother    History  Substance Use Topics  . Smoking status: Never Smoker   . Smokeless tobacco: Never Used  . Alcohol Use: No   Comment: Quit 20 years ago   Current Outpatient Prescriptions  Medication Sig Dispense Refill  . allopurinol (ZYLOPRIM) 100 MG tablet Take 100 mg by mouth at bedtime.     . B Complex-C-Folic Acid (DIALYVITE TABLET) TABS     . b complex-vitamin c-folic acid (NEPHRO-VITE) 0.8 MG TABS Take 0.8 mg by mouth at bedtime.      . folic acid-vitamin b complex-vitamin c-selenium-zinc (DIALYVITE) 3 MG TABS Take 1 tablet by mouth at bedtime.    . levETIRAcetam (KEPPRA) 500 MG tablet Take 250 mg by mouth at bedtime.     . nitroGLYCERIN (NITROSTAT) 0.4 MG SL tablet Place 0.4 mg under the tongue every 5 (five) minutes as needed. Chest pain    . polyethylene glycol (MIRALAX / GLYCOLAX) packet Take 17 g by mouth daily. For constipation     . pregabalin (LYRICA) 75 MG capsule Take 75 mg by mouth daily.     Marland Kitchen RENVELA 800 MG tablet Take 800 mg by mouth daily. 5 TABS DAILY    . senna (SENOKOT) 8.6 MG TABS Take 1 tablet by mouth daily.      . SENSIPAR 90 MG tablet     . Tamsulosin HCl (FLOMAX) 0.4 MG  CAPS Take 0.4 mg by mouth daily after breakfast.     . warfarin (COUMADIN) 5 MG tablet Take 7.5-10 mg by mouth daily. Monday and Friday take two tablets (10mg ). All other days take one and one-half tablets (7.5mg ).     No current facility-administered medications for this visit.   No Known Allergies   Review of Systems: Positive for vision changes, fatigue, heart murmur, heart rhythm changes, itching, shortness breath, sore throat and swelling of feet and legs. All other systems reviewed and negative except where noted in HPI.   Physical Exam: BP 106/52 mmHg  Pulse 99  Ht 5\' 6"  (1.676 m)  Wt 161 lb (73.029 kg)  BMI 26.00 kg/m2 Constitutional: Pleasant,well-developed, black male in no acute distress. HEENT: Normocephalic and atraumatic. Conjunctivae are normal. No scleral icterus. Neck supple.  Cardiovascular: Normal rate, regular rhythm. Loud murmur Pulmonary/chest: Effort normal and breath sounds normal.  No wheezing, rales or rhonchi. Abdominal: Soft, nondistended, nontender. Bowel sounds active throughout. There are no masses palpable. No hepatomegaly. Extremities: no edema. Brace LLE. Dialysis shunt RUE. Lymphadenopathy: No cervical adenopathy noted. Neurological: Alert and oriented to person place and time. Skin: Skin is warm and dry. No rashes noted. Psychiatric: Normal mood and affect. Behavior is normal.   ASSESSMENT AND PLAN:  1. Pleasant 74 year old male referred for heme positive stool and mild drop in hgb from 9.2 to 8.7. Will request 2010 colonoscopy report from [place]. In the interim I asked patient to try and find out for sure if he had a colonoscopy in [place] a few years ago and where it was done. Patient is at increased risk for endoscopic procedures. We discussed options. If previous colonoscopy normal we could just do an EGD (? on coumadin). If we can't get records or colonoscopy was abnormal then will need to consider EGD / colonoscopy to be done at the hospital off coumadin.   2. Chronic anemia, multifactorial but recent mild drop in hgb. Ferritin mid April was 1524   3. Multiple medical problems not limited to CVA, DM, ESRD on HD, aortic stenosis remote hx of right RCC, s/p RFA.   Cc: Mauricia Area, MD  Addendum: Reviewed and agree with initial management. Await result of previously endoscopic procedure which will help determine most appropriate course of diagnosis/treatment Jerene Bears, MD

## 2015-02-11 NOTE — Patient Instructions (Signed)
We will contact you on scheduling a colonoscopy after we have received your last colonoscopy report.

## 2015-02-12 ENCOUNTER — Ambulatory Visit (HOSPITAL_COMMUNITY)
Admission: RE | Admit: 2015-02-12 | Discharge: 2015-02-12 | Disposition: A | Discharge status: Home / Self Care | Payer: Medicare Other | Source: Ambulatory Visit | Attending: Nephrology | Admitting: Nephrology

## 2015-02-12 DIAGNOSIS — N186 End stage renal disease: Secondary | ICD-10-CM | POA: Insufficient documentation

## 2015-02-12 DIAGNOSIS — D638 Anemia in other chronic diseases classified elsewhere: Secondary | ICD-10-CM | POA: Insufficient documentation

## 2015-02-12 DIAGNOSIS — Z85528 Personal history of other malignant neoplasm of kidney: Secondary | ICD-10-CM | POA: Diagnosis not present

## 2015-02-12 DIAGNOSIS — N2581 Secondary hyperparathyroidism of renal origin: Secondary | ICD-10-CM | POA: Diagnosis not present

## 2015-02-12 DIAGNOSIS — Z9289 Personal history of other medical treatment: Secondary | ICD-10-CM

## 2015-02-12 DIAGNOSIS — I120 Hypertensive chronic kidney disease with stage 5 chronic kidney disease or end stage renal disease: Secondary | ICD-10-CM | POA: Diagnosis not present

## 2015-02-12 DIAGNOSIS — E119 Type 2 diabetes mellitus without complications: Secondary | ICD-10-CM | POA: Insufficient documentation

## 2015-02-12 HISTORY — DX: Personal history of other medical treatment: Z92.89

## 2015-02-12 LAB — ABO/RH: ABO/RH(D): O POS

## 2015-02-12 LAB — PREPARE RBC (CROSSMATCH)

## 2015-02-12 MED ORDER — SODIUM CHLORIDE 0.9 % IV SOLN
Freq: Once | INTRAVENOUS | Status: AC
  Administered 2015-02-12: 14:00:00 via INTRAVENOUS

## 2015-02-14 LAB — TYPE AND SCREEN
ABO/RH(D): O POS
Antibody Screen: NEGATIVE
Unit division: 0
Unit division: 0

## 2015-02-18 ENCOUNTER — Telehealth: Payer: Self-pay

## 2015-02-18 NOTE — Telephone Encounter (Signed)
I spoke with Renea at dialysis Center. Patient had hemoglobin 6.4 last Friday received 3 units of packed red cells and hemoglobin yesterday back down to 7. Plan for stat hemoglobin at dialysis tomorrow and repeat transfusion if necessary Given severe aortic stenosis combined with heme-positive stool and ongoing blood loss requiring transfusion, I feel that admission is indicated. His warfarin will need to be held and would plan upper endoscopy and colonoscopy for evaluation of heme-positive stool while admitted with monitored anesthesia care. Monitored anesthesia care felt appropriate given severe aortic stenosis. I tried contacting the patient, the patient's daughter through all the contact numbers in the chart and reached no one. I did speak to the dialysis nurse Ren, again, and she will call us tomorrow with hemoglobin and INR results. I expect he will need to be admitted from dialysis through the hospitalist service.

## 2015-02-18 NOTE — Telephone Encounter (Signed)
Received call from Juanell Fairly NP with kidney center. Pt was seen by Tye Savoy NP 02/11/15. Juanell Fairly NP states pt got blood last week and today Hgb 7.0 and he is going to have to receive more blood. Would like pt scheduled for Colon asap, state he is quite sick. Pt is on dialysis on M-W-F. Dr. Hilarie Fredrickson please advise. Number of kidney center [phone] Charge nurse is Renea.

## 2015-02-19 ENCOUNTER — Inpatient Hospital Stay (HOSPITAL_COMMUNITY)
Admission: AD | Admit: 2015-02-19 | Discharge: 2015-02-23 | DRG: 811 | Disposition: A | Discharge status: Home / Self Care | Payer: Medicare Other | Source: Ambulatory Visit | Attending: Internal Medicine | Admitting: Internal Medicine

## 2015-02-19 ENCOUNTER — Encounter (HOSPITAL_COMMUNITY): Payer: Self-pay | Admitting: General Practice

## 2015-02-19 DIAGNOSIS — N400 Enlarged prostate without lower urinary tract symptoms: Secondary | ICD-10-CM | POA: Diagnosis present

## 2015-02-19 DIAGNOSIS — R195 Other fecal abnormalities: Secondary | ICD-10-CM | POA: Diagnosis not present

## 2015-02-19 DIAGNOSIS — Z79899 Other long term (current) drug therapy: Secondary | ICD-10-CM | POA: Diagnosis not present

## 2015-02-19 DIAGNOSIS — M109 Gout, unspecified: Secondary | ICD-10-CM | POA: Diagnosis present

## 2015-02-19 DIAGNOSIS — Z85528 Personal history of other malignant neoplasm of kidney: Secondary | ICD-10-CM

## 2015-02-19 DIAGNOSIS — D124 Benign neoplasm of descending colon: Secondary | ICD-10-CM | POA: Diagnosis not present

## 2015-02-19 DIAGNOSIS — D125 Benign neoplasm of sigmoid colon: Secondary | ICD-10-CM | POA: Diagnosis not present

## 2015-02-19 DIAGNOSIS — N2581 Secondary hyperparathyroidism of renal origin: Secondary | ICD-10-CM | POA: Diagnosis present

## 2015-02-19 DIAGNOSIS — Z8249 Family history of ischemic heart disease and other diseases of the circulatory system: Secondary | ICD-10-CM | POA: Diagnosis not present

## 2015-02-19 DIAGNOSIS — D62 Acute posthemorrhagic anemia: Principal | ICD-10-CM | POA: Diagnosis present

## 2015-02-19 DIAGNOSIS — Z8673 Personal history of transient ischemic attack (TIA), and cerebral infarction without residual deficits: Secondary | ICD-10-CM | POA: Diagnosis not present

## 2015-02-19 DIAGNOSIS — D696 Thrombocytopenia, unspecified: Secondary | ICD-10-CM | POA: Diagnosis present

## 2015-02-19 DIAGNOSIS — Z833 Family history of diabetes mellitus: Secondary | ICD-10-CM | POA: Diagnosis not present

## 2015-02-19 DIAGNOSIS — R791 Abnormal coagulation profile: Secondary | ICD-10-CM | POA: Diagnosis present

## 2015-02-19 DIAGNOSIS — D649 Anemia, unspecified: Secondary | ICD-10-CM | POA: Diagnosis present

## 2015-02-19 DIAGNOSIS — N186 End stage renal disease: Secondary | ICD-10-CM | POA: Diagnosis present

## 2015-02-19 DIAGNOSIS — E119 Type 2 diabetes mellitus without complications: Secondary | ICD-10-CM

## 2015-02-19 DIAGNOSIS — E875 Hyperkalemia: Secondary | ICD-10-CM | POA: Diagnosis present

## 2015-02-19 DIAGNOSIS — G959 Disease of spinal cord, unspecified: Secondary | ICD-10-CM | POA: Diagnosis present

## 2015-02-19 DIAGNOSIS — M21372 Foot drop, left foot: Secondary | ICD-10-CM | POA: Diagnosis present

## 2015-02-19 DIAGNOSIS — E0821 Diabetes mellitus due to underlying condition with diabetic nephropathy: Secondary | ICD-10-CM

## 2015-02-19 DIAGNOSIS — E1140 Type 2 diabetes mellitus with diabetic neuropathy, unspecified: Secondary | ICD-10-CM | POA: Diagnosis present

## 2015-02-19 DIAGNOSIS — I120 Hypertensive chronic kidney disease with stage 5 chronic kidney disease or end stage renal disease: Secondary | ICD-10-CM | POA: Diagnosis present

## 2015-02-19 DIAGNOSIS — I350 Nonrheumatic aortic (valve) stenosis: Secondary | ICD-10-CM | POA: Diagnosis present

## 2015-02-19 DIAGNOSIS — R569 Unspecified convulsions: Secondary | ICD-10-CM

## 2015-02-19 DIAGNOSIS — Z86718 Personal history of other venous thrombosis and embolism: Secondary | ICD-10-CM

## 2015-02-19 DIAGNOSIS — I4891 Unspecified atrial fibrillation: Secondary | ICD-10-CM | POA: Diagnosis present

## 2015-02-19 DIAGNOSIS — Z7901 Long term (current) use of anticoagulants: Secondary | ICD-10-CM | POA: Diagnosis not present

## 2015-02-19 DIAGNOSIS — Z992 Dependence on renal dialysis: Secondary | ICD-10-CM

## 2015-02-19 DIAGNOSIS — I482 Chronic atrial fibrillation: Secondary | ICD-10-CM | POA: Diagnosis not present

## 2015-02-19 DIAGNOSIS — I10 Essential (primary) hypertension: Secondary | ICD-10-CM | POA: Diagnosis present

## 2015-02-19 DIAGNOSIS — G4730 Sleep apnea, unspecified: Secondary | ICD-10-CM | POA: Diagnosis present

## 2015-02-19 DIAGNOSIS — I6330 Cerebral infarction due to thrombosis of unspecified cerebral artery: Secondary | ICD-10-CM | POA: Diagnosis present

## 2015-02-19 HISTORY — DX: Acute embolism and thrombosis of unspecified deep veins of unspecified lower extremity: I82.409

## 2015-02-19 HISTORY — DX: Benign prostatic hyperplasia without lower urinary tract symptoms: N40.0

## 2015-02-19 HISTORY — DX: Personal history of other medical treatment: Z92.89

## 2015-02-19 HISTORY — DX: Pneumonia, unspecified organism: J18.9

## 2015-02-19 HISTORY — DX: Unspecified convulsions: R56.9

## 2015-02-19 HISTORY — DX: End stage renal disease: N18.6

## 2015-02-19 HISTORY — DX: Cerebral infarction, unspecified: I63.9

## 2015-02-19 HISTORY — DX: Personal history of other diseases of the musculoskeletal system and connective tissue: Z87.39

## 2015-02-19 HISTORY — DX: Type 2 diabetes mellitus without complications: E11.9

## 2015-02-19 HISTORY — DX: Sleep apnea, unspecified: G47.30

## 2015-02-19 HISTORY — DX: Dependence on renal dialysis: Z99.2

## 2015-02-19 LAB — COMPREHENSIVE METABOLIC PANEL
ALT: 20 U/L (ref 17–63)
AST: 19 U/L (ref 15–41)
Albumin: 2.6 g/dL — ABNORMAL LOW (ref 3.5–5.0)
Alkaline Phosphatase: 46 U/L (ref 38–126)
Anion gap: 6 (ref 5–15)
BILIRUBIN TOTAL: 0.2 mg/dL — AB (ref 0.3–1.2)
BUN: 24 mg/dL — ABNORMAL HIGH (ref 6–20)
CHLORIDE: 98 mmol/L — AB (ref 101–111)
CO2: 35 mmol/L — AB (ref 22–32)
CREATININE: 4 mg/dL — AB (ref 0.61–1.24)
Calcium: 7.9 mg/dL — ABNORMAL LOW (ref 8.9–10.3)
GFR calc Af Amer: 16 mL/min — ABNORMAL LOW (ref >60)
GFR calc non Af Amer: 13 mL/min — ABNORMAL LOW (ref >60)
Glucose, Bld: 81 mg/dL (ref 65–99)
Potassium: 3.9 mmol/L (ref 3.5–5.1)
Sodium: 139 mmol/L (ref 135–145)
Total Protein: 5.2 g/dL — ABNORMAL LOW (ref 6.5–8.1)

## 2015-02-19 LAB — CBC WITH DIFFERENTIAL/PLATELET
Basophils Absolute: 0 10*3/uL (ref 0.0–0.1)
Basophils Relative: 0 % (ref 0–1)
EOS ABS: 0.2 10*3/uL (ref 0.0–0.7)
Eosinophils Relative: 4 % (ref 0–5)
HEMATOCRIT: 18.7 % — AB (ref 39.0–52.0)
HEMOGLOBIN: 5.9 g/dL — AB (ref 13.0–17.0)
LYMPHS PCT: 25 % (ref 12–46)
Lymphs Abs: 1.2 10*3/uL (ref 0.7–4.0)
MCH: 28.1 pg (ref 26.0–34.0)
MCHC: 31.6 g/dL (ref 30.0–36.0)
MCV: 89 fL (ref 78.0–100.0)
Monocytes Absolute: 0.6 10*3/uL (ref 0.1–1.0)
Monocytes Relative: 12 % (ref 3–12)
NEUTROS PCT: 59 % (ref 43–77)
Neutro Abs: 2.8 10*3/uL (ref 1.7–7.7)
Platelets: 140 10*3/uL — ABNORMAL LOW (ref 150–400)
RBC: 2.1 MIL/uL — AB (ref 4.22–5.81)
RDW: 17.7 % — AB (ref 11.5–15.5)
WBC: 4.8 10*3/uL (ref 4.0–10.5)

## 2015-02-19 LAB — MAGNESIUM: MAGNESIUM: 2.1 mg/dL (ref 1.7–2.4)

## 2015-02-19 LAB — VITAMIN B12: Vitamin B-12: 2123 pg/mL — ABNORMAL HIGH (ref 180–914)

## 2015-02-19 LAB — APTT: aPTT: 39 seconds — ABNORMAL HIGH (ref 24–37)

## 2015-02-19 LAB — PROTIME-INR
INR: 1.74 — ABNORMAL HIGH (ref 0.00–1.49)
Prothrombin Time: 20.3 seconds — ABNORMAL HIGH (ref 11.6–15.2)

## 2015-02-19 LAB — IRON AND TIBC
IRON: 27 ug/dL — AB (ref 45–182)
Saturation Ratios: 14 % — ABNORMAL LOW (ref 17.9–39.5)
TIBC: 189 ug/dL — AB (ref 250–450)
UIBC: 162 ug/dL

## 2015-02-19 LAB — PREPARE RBC (CROSSMATCH)

## 2015-02-19 LAB — FERRITIN: FERRITIN: 771 ng/mL — AB (ref 24–336)

## 2015-02-19 LAB — GLUCOSE, CAPILLARY
Glucose-Capillary: 78 mg/dL (ref 65–99)
Glucose-Capillary: 97 mg/dL (ref 65–99)

## 2015-02-19 LAB — PHOSPHORUS: Phosphorus: 3.5 mg/dL (ref 2.5–4.6)

## 2015-02-19 LAB — FOLATE: FOLATE: 29.6 ng/mL (ref >5.9)

## 2015-02-19 MED ORDER — LEVETIRACETAM 250 MG PO TABS
250.0000 mg | ORAL_TABLET | Freq: Every day | ORAL | Status: DC
  Administered 2015-02-19 – 2015-02-22 (×4): 250 mg via ORAL
  Filled 2015-02-19 (×5): qty 1

## 2015-02-19 MED ORDER — SODIUM CHLORIDE 0.9 % IJ SOLN: 3.0000 mL | INTRAMUSCULAR | Status: DC | PRN

## 2015-02-19 MED ORDER — GI COCKTAIL ~~LOC~~: 30.0000 mL | Freq: Three times a day (TID) | ORAL | Status: DC | PRN

## 2015-02-19 MED ORDER — SODIUM CHLORIDE 0.9 % IV SOLN
125.0000 mg | INTRAVENOUS | Status: DC
  Administered 2015-02-22: 125 mg via INTRAVENOUS
  Filled 2015-02-19 (×2): qty 10

## 2015-02-19 MED ORDER — INSULIN ASPART 100 UNIT/ML ~~LOC~~ SOLN
0.0000 [IU] | Freq: Three times a day (TID) | SUBCUTANEOUS | Status: DC
  Administered 2015-02-21: 1 [IU] via SUBCUTANEOUS

## 2015-02-19 MED ORDER — SENNA 8.6 MG PO TABS
1.0000 | ORAL_TABLET | Freq: Every day | ORAL | Status: DC
  Administered 2015-02-19 – 2015-02-23 (×4): 8.6 mg via ORAL
  Filled 2015-02-19 (×5): qty 1

## 2015-02-19 MED ORDER — SEVELAMER CARBONATE 800 MG PO TABS: 800.0000 mg | ORAL_TABLET | Freq: Every day | ORAL | Status: DC

## 2015-02-19 MED ORDER — ONDANSETRON HCL 4 MG PO TABS: 4.0000 mg | ORAL_TABLET | Freq: Four times a day (QID) | ORAL | Status: DC | PRN

## 2015-02-19 MED ORDER — CINACALCET HCL 30 MG PO TABS
90.0000 mg | ORAL_TABLET | Freq: Every day | ORAL | Status: DC
  Administered 2015-02-20 – 2015-02-21 (×2): 90 mg via ORAL
  Filled 2015-02-19 (×4): qty 3

## 2015-02-19 MED ORDER — SEVELAMER CARBONATE 800 MG PO TABS
2400.0000 mg | ORAL_TABLET | Freq: Three times a day (TID) | ORAL | Status: DC
Start: 2015-02-20 — End: 2015-02-22
  Administered 2015-02-20 – 2015-02-21 (×5): 2400 mg via ORAL
  Filled 2015-02-19 (×13): qty 3

## 2015-02-19 MED ORDER — POLYETHYLENE GLYCOL 3350 17 G PO PACK
17.0000 g | PACK | Freq: Every day | ORAL | Status: DC
  Administered 2015-02-19 – 2015-02-23 (×4): 17 g via ORAL
  Filled 2015-02-19 (×6): qty 1

## 2015-02-19 MED ORDER — SODIUM CHLORIDE 0.9 % IV SOLN: Freq: Once | INTRAVENOUS | Status: DC

## 2015-02-19 MED ORDER — SODIUM CHLORIDE 0.9 % IV SOLN: 250.0000 mL | INTRAVENOUS | Status: DC | PRN

## 2015-02-19 MED ORDER — PANTOPRAZOLE SODIUM 40 MG IV SOLR
40.0000 mg | Freq: Two times a day (BID) | INTRAVENOUS | Status: DC
  Administered 2015-02-20 – 2015-02-22 (×5): 40 mg via INTRAVENOUS
  Filled 2015-02-19 (×9): qty 40

## 2015-02-19 MED ORDER — DIPHENHYDRAMINE HCL 25 MG PO CAPS
25.0000 mg | ORAL_CAPSULE | Freq: Once | ORAL | Status: AC
  Administered 2015-02-19: 25 mg via ORAL
  Filled 2015-02-19: qty 1

## 2015-02-19 MED ORDER — RENA-VITE PO TABS
1.0000 | ORAL_TABLET | Freq: Every day | ORAL | Status: DC
  Administered 2015-02-19 – 2015-02-22 (×4): 1 via ORAL
  Filled 2015-02-19 (×5): qty 1

## 2015-02-19 MED ORDER — ACETAMINOPHEN 325 MG PO TABS: 650.0000 mg | ORAL_TABLET | Freq: Four times a day (QID) | ORAL | Status: DC | PRN

## 2015-02-19 MED ORDER — PREGABALIN 75 MG PO CAPS: 75.0000 mg | ORAL_CAPSULE | Freq: Every day | ORAL | Status: DC

## 2015-02-19 MED ORDER — ONDANSETRON HCL 4 MG/2ML IJ SOLN: 4.0000 mg | Freq: Four times a day (QID) | INTRAMUSCULAR | Status: DC | PRN

## 2015-02-19 MED ORDER — ALLOPURINOL 100 MG PO TABS
100.0000 mg | ORAL_TABLET | Freq: Every day | ORAL | Status: DC
  Administered 2015-02-19 – 2015-02-22 (×4): 100 mg via ORAL
  Filled 2015-02-19 (×5): qty 1

## 2015-02-19 MED ORDER — TAMSULOSIN HCL 0.4 MG PO CAPS
0.4000 mg | ORAL_CAPSULE | Freq: Every day | ORAL | Status: DC
  Administered 2015-02-20 – 2015-02-23 (×4): 0.4 mg via ORAL
  Filled 2015-02-19 (×6): qty 1

## 2015-02-19 MED ORDER — ACETAMINOPHEN 325 MG PO TABS
650.0000 mg | ORAL_TABLET | Freq: Once | ORAL | Status: AC
  Administered 2015-02-19: 650 mg via ORAL
  Filled 2015-02-19: qty 2

## 2015-02-19 MED ORDER — LEVALBUTEROL HCL 0.63 MG/3ML IN NEBU: 0.6300 mg | INHALATION_SOLUTION | RESPIRATORY_TRACT | Status: DC | PRN

## 2015-02-19 MED ORDER — SODIUM CHLORIDE 0.9 % IJ SOLN
3.0000 mL | Freq: Two times a day (BID) | INTRAMUSCULAR | Status: DC
  Administered 2015-02-20 – 2015-02-23 (×6): 3 mL via INTRAVENOUS

## 2015-02-19 MED ORDER — ACETAMINOPHEN 650 MG RE SUPP: 650.0000 mg | Freq: Four times a day (QID) | RECTAL | Status: DC | PRN

## 2015-02-19 MED ORDER — SODIUM CHLORIDE 0.9 % IJ SOLN
3.0000 mL | Freq: Two times a day (BID) | INTRAMUSCULAR | Status: DC
  Administered 2015-02-20 – 2015-02-22 (×3): 3 mL via INTRAVENOUS

## 2015-02-19 NOTE — Consult Note (Signed)
[facility] Renal Consultation Note    Indication for Consultation:  Management of ESRD/hemodialysis; anemia, hypertension/volume and secondary hyperparathyroidism PCP:  HPI: [name] is a 74 y.o. male with ESRD on HD at [facility] MWF. Has PMH significant for atrial fib on coumadin, Aortic stenosis, secondary hyperparathyroidism, DM, HTN, Anemia of chronic kidney disease, CVA, renal carcinoma, BPH, DVT, seizure disorder, PNA, hyperkalemia, gout.   [name] has had recent drop in hemoglobin and tarry stools. HGB dropped from 8.7 (06/29/160 to 6.7 (02/10/15) requiring transfusion of 2 units of PRBCs at [place] 02/12/15.  Hgb was 7.0 01/18/15.  Dr. Vena Rua office was notified. Hgb rechecked in center today was 6.8. He is being admitted for evaluation of source of blood loss.   [name] attends HD as ordered, although does sign off early. Last HGB as above. Have been receiving Mircera 225 mcg IV q 2 weeks, last dose 02/10/15.  Phos 5.2, Ca 9.54m C Ca 10 (02/17/15) PTH 213 (01/20/15).     Past Medical History  Diagnosis Date  . Atrial fibrillation     coumadin followed by SNF, longstanding persistent and probably permanent afib  . Aortic stenosis     Echo 02/12/11: moderate aortic stenosis, mean gradient 21,  . Hyperparathyroidism, secondary   . Neuropathy     diabetec  . Hypertension   . Hyperkalemia   . BPH (benign prostatic hypertrophy)   . Carotid bruit     Doppler 7/12: Plaque without significant ICA stenosis  . Bradycardia     asymptomatic  . Atrial enlargement, bilateral   . Type II diabetes mellitus   . DVT (deep venous thrombosis)   . Pneumonia     "couple times" (02/19/2015)  . Sleep apnea     "wore mask for 3-4 years; don't know if I still need to; don't have machine anymore" (02/19/2015)  . Anemia     chronic disease  . History of blood transfusion 02/12/2015    "related to blood in my stool"  . Seizures     "~ 2006/2007; don't know  what kind; took RX for awhile"   . Stroke 1999    "foot drop on left foot since" (02/19/2015)  . History of gout   . ESRD (end stage renal disease) on dialysis     "MWF; [facility]; [place]" (02/19/2015)  . Renal carcinoma ~ 2007    followed at [facility]; s/p gamma knife surgery  . BPH (benign prostatic hyperplasia) 02/19/2015   Past Surgical History  Procedure Laterality Date  . Bunionectomy Bilateral 1981  . Radiofrequency ablation kidney    . Posterior cervical laminectomy       C3-C6 laminectomy with Dr. Vertell Limber and Dr. Joya Salm  . Thrombectomy Bilateral 1999, 2004    "calves"  . Tonsillectomy    . Back surgery    . Av fistula placement Right     forearm  . Av fistula repair  "several times"    "had to put a couple stents in it; cleared it out"   Family History  Problem Relation Age of Onset  . Diabetes Mother   . Heart disease Mother   . Hyperlipidemia Mother   . Hypertension Mother   . Diabetes Father   . Hyperlipidemia Father   . Hypertension Father   . Diabetes Sister   . Kidney disease Sister   . Diabetes Brother   . Kidney disease Brother    Social History:  reports that he has never smoked.  He has never used smokeless tobacco. He reports that he drinks alcohol. He reports that he does not use illicit drugs. No Known Allergies Prior to Admission medications   Medication Sig Start Date End Date Taking? Authorizing Provider  allopurinol (ZYLOPRIM) 100 MG tablet Take 100 mg by mouth at bedtime.     Historical Provider, MD  B Complex-C-Folic Acid (DIALYVITE TABLET) TABS  04/15/14   Historical Provider, MD  b complex-vitamin c-folic acid (NEPHRO-VITE) 0.8 MG TABS Take 0.8 mg by mouth at bedtime.      Historical Provider, MD  folic acid-vitamin b complex-vitamin c-selenium-zinc (DIALYVITE) 3 MG TABS Take 1 tablet by mouth at bedtime.    Historical Provider, MD  levETIRAcetam (KEPPRA) 500 MG tablet Take 250 mg by mouth at bedtime.     Historical Provider, MD  nitroGLYCERIN  (NITROSTAT) 0.4 MG SL tablet Place 0.4 mg under the tongue every 5 (five) minutes as needed. Chest pain    Historical Provider, MD  polyethylene glycol (MIRALAX / GLYCOLAX) packet Take 17 g by mouth daily. For constipation     Historical Provider, MD  pregabalin (LYRICA) 75 MG capsule Take 75 mg by mouth daily.     Historical Provider, MD  RENVELA 800 MG tablet Take 800 mg by mouth daily. 5 TABS DAILY 04/03/14   Historical Provider, MD  senna (SENOKOT) 8.6 MG TABS Take 1 tablet by mouth daily.      Historical Provider, MD  SENSIPAR 90 MG tablet  01/12/15   Historical Provider, MD  Tamsulosin HCl (FLOMAX) 0.4 MG CAPS Take 0.4 mg by mouth daily after breakfast.     Historical Provider, MD  warfarin (COUMADIN) 5 MG tablet Take 7.5-10 mg by mouth daily. Monday and Friday take two tablets (10mg ). All other days take one and one-half tablets (7.5mg ).    Historical Provider, MD   Current Facility-Administered Medications  Medication Dose Route Frequency Provider Last Rate Last Dose  . 0.9 %  sodium chloride infusion  250 mL Intravenous PRN Irine Seal V, MD      . 0.9 %  sodium chloride infusion   Intravenous Once Irine Seal V, MD      . acetaminophen (TYLENOL) tablet 650 mg  650 mg Oral Q6H PRN Eugenie Filler, MD       Or  . acetaminophen (TYLENOL) suppository 650 mg  650 mg Rectal Q6H PRN Eugenie Filler, MD      . acetaminophen (TYLENOL) tablet 650 mg  650 mg Oral Once Irine Seal V, MD      . allopurinol (ZYLOPRIM) tablet 100 mg  100 mg Oral QHS Eugenie Filler, MD      . Derrill Memo ON 02/20/2015] cinacalcet (SENSIPAR) tablet 90 mg  90 mg Oral Q breakfast Irine Seal V, MD      . diphenhydrAMINE (BENADRYL) capsule 25 mg  25 mg Oral Once Eugenie Filler, MD      . gi cocktail (Maalox,Lidocaine,Donnatal)  30 mL Oral TID PRN Eugenie Filler, MD      . insulin aspart (novoLOG) injection 0-9 Units  0-9 Units Subcutaneous TID WC Eugenie Filler, MD      . levalbuterol [facility])  nebulizer solution 0.63 mg  0.63 mg Nebulization Q2H PRN Eugenie Filler, MD      . levETIRAcetam (KEPPRA) tablet 250 mg  250 mg Oral QHS Eugenie Filler, MD      . multivitamin (RENA-VIT) tablet 1 tablet  1 tablet Oral QHS Irine Seal  V, MD      . ondansetron (ZOFRAN) tablet 4 mg  4 mg Oral Q6H PRN Eugenie Filler, MD       Or  . ondansetron [facility]) injection 4 mg  4 mg Intravenous Q6H PRN Eugenie Filler, MD      . pantoprazole (PROTONIX) injection 40 mg  40 mg Intravenous Q12H Irine Seal V, MD      . polyethylene glycol (MIRALAX / GLYCOLAX) packet 17 g  17 g Oral Daily Irine Seal V, MD      . pregabalin (LYRICA) capsule 75 mg  75 mg Oral Daily Eugenie Filler, MD      . senna [facility]) tablet 8.6 mg  1 tablet Oral Daily Irine Seal V, MD      . sevelamer carbonate (RENVELA) tablet 800 mg  800 mg Oral Daily Irine Seal V, MD      . sodium chloride 0.9 % injection 3 mL  3 mL Intravenous Q12H Irine Seal V, MD      . sodium chloride 0.9 % injection 3 mL  3 mL Intravenous Q12H Irine Seal V, MD      . sodium chloride 0.9 % injection 3 mL  3 mL Intravenous PRN Eugenie Filler, MD      . Derrill Memo ON 02/20/2015] tamsulosin (FLOMAX) capsule 0.4 mg  0.4 mg Oral QPC breakfast Eugenie Filler, MD       Labs: Basic Metabolic Panel: No results for input(s): NA, K, CL, CO2, GLUCOSE, BUN, CREATININE, CALCIUM, ALB, PHOS in the last 168 hours. Liver Function Tests: No results for input(s): AST, ALT, ALKPHOS, BILITOT, PROT, ALBUMIN in the last 168 hours. No results for input(s): LIPASE, AMYLASE in the last 168 hours. No results for input(s): AMMONIA in the last 168 hours. CBC: No results for input(s): WBC, NEUTROABS, HGB, HCT, MCV, PLT in the last 168 hours. Cardiac Enzymes: No results for input(s): CKTOTAL, CKMB, CKMBINDEX, TROPONINI in the last 168 hours. CBG: No results for input(s): GLUCAP in the last 168 hours. Iron Studies: No results for input(s): IRON,  TIBC, TRANSFERRIN, FERRITIN in the last 72 hours. Studies/Results: No results found.  ROS: As per HPI otherwise negative.  Subjective: "I'm doing all right". Daughter at bedside to assist with history. Patient denies abdominal pain, chest pain, SOB.   Physical Exam: Filed Vitals:   02/19/15 1418  BP: 117/60  Pulse: 54  Temp: 97.7 F (36.5 C)  TempSrc: Oral  Resp: 20  Height: 5\' 6"  (1.676 m)  Weight: 72.9 kg (160 lb 11.5 oz)  SpO2: 100%     General: Well developed, well nourished, in no acute distress. Head: Normocephalic, atraumatic, sclera non-icteric, mucus membranes are moist Neck: Supple. JVD not elevated. Lungs: Clear bilaterally to auscultation without wheezes, rales, or rhonchi. Breathing is unlabored. Heart: Irregular, S1 S2. Has harsh III/VI systolic M. Has soft R. Carotid bruit.  Abdomen: Soft, non-tender, non-distended with normoactive bowel sounds. No rebound/guarding. No obvious abdominal masses. M-S:  Strength and tone appear normal for age. Lower extremities:2+ Pitting edema BLEs.  No ischemic changes, no open wounds  Neuro: Alert and oriented X 3. Moves all extremities spontaneously. CN II-XII grossly intact. Skin: warm dry intact with lesions, rashes, pruritis.  Psych:  Responds to questions appropriately with a normal affect. Dialysis Access: RFA AVF+ thrill/+Bruitt  Dialysis Orders: Center: [place]  on MWF . EDW 72 kg HD [place] 2.0 K 2.0 Ca  Time 4 hours Heparin NO HEPARIN. Access RFA AVF  BFR 450 DFR 800    Mircera 225 mcg IV q 2 weeks, last dose 02/10/15.  Calcitriol 2.25 mcg MWF with HD  Assessment/Plan: 1.  Anemia/+FOBT:  GI consulted (Dr. Hilarie Fredrickson) HGB 6.8. Will transfuse two units of PRBCs in HD tomorrow morning. PPI per primary. Endoscopy scheduled for 02/22/15.  2.  ESRD -  Hemodialysis MWF at [facility].  Will have HD tomorrow for transfusion.  3.  Hypertension/volume  - Had HD today in center. Left early, 1 kg above EDW. BP low at end of treatment but pt has BLE  edema. Will attempt 1-1.5 liters tomorrow.  No BP meds at home. 4.  Anemia  - As above. Monitor HGB, transfuse tomorrow. T Sat 23% (02/17/15). Will load with Fe.  5.  Metabolic bone disease -  Phos 5.2, Ca 9.38m C Ca 10 (02/17/15) PTH 213 (01/20/15). Continue Renvela but reduce dose while in hospital to 2400 mg PO TID with meals. Continue sensipar.  6.  Nutrition - Albumin 3.5 (02/17/15) Renal diet/Renal vit.  7. Afib: Coumadin on hold. 8. DM: No home meds. Will follow QID BS per primary with S/S insulin as needed.   Rita H. Owens Shark, NP-C 02/19/2015, 4:40 PM  [employer] [phone]  Renal Attending: Pleasant male with recent GIB and anemia with plans for GI wu next week is admitted fro dialysis with low hgb which has fallen further to 5.3gram.  He has c/o "dark stools".  Will do dialysis and transfuse tonight. Jacalynn Buzzell C

## 2015-02-19 NOTE — Progress Notes (Addendum)
    Progress Note   Subjective  Feels okay. Just admitted from dialysis. Stools very dark.    Objective   Vital signs in last 24 hours: Temp:  [97.7 F (36.5 C)] 97.7 F (36.5 C) (07/15 1418) Pulse Rate:  [54] 54 (07/15 1418) Resp:  [20] 20 (07/15 1418) BP: (117)/(60) 117/60 mmHg (07/15 1418) SpO2:  [100 %] 100 % (07/15 1418) Weight:  [160 lb 11.5 oz (72.9 kg)] 160 lb 11.5 oz (72.9 kg) (07/15 1418)   General:   Black male in NAD Heart:  Regular rate and rhythm. Loud murmur Lungs: Respirations even and unlabored, lungs CTA bilaterally Abdomen:  Soft, nontender and nondistended. Normal bowel sounds. Extremities:  Without edema. Neurologic:  Alert and oriented,  grossly normal neurologically. Psych:  Cooperative. Normal mood and affect.     Assessment / Plan:   26. 74 year old male who I recently saw in the office for drop in hgb and hemoccult-positive stool on Coumadin. His hemoglobin had recently dropped from 9.2-8.7. We were waiting on colonoscopy records from [place] before making decision to do just EGD or EGD/colonoscopy. In the interim Nephrology called and patient hgb had fallen into 7 range. He was transfused 3 units of blood last Friday. A repeat hgb yesterday showed that hgb was back in 7 range. His INR was actually subtherapeutic (<2). Spoke with [facility] yesterday and plan was to admit patient after dialysis today if hgb declined further. Dialysis called me today, hgb is <7 now.   Patient will be admitted, warfarin held. If medically stable he can be prepped Sunday for inpatient EGD / colonoscopy with MAC on Monday. Patient and daughter agreeable. Dr. Collene Mares rounding on [place] patient's over weekend.    2. Multiple significant medical problems not limited to ESRD on HD, atrial fibrillation, HTN, CVA, DVT, diabetes and aortic stenosis.     LOS: 0 days   [name]  02/19/2015, 3:30 PM   Patient seen, examined, and I agree with the above documentation,  including the assessment and plan. Patient admitted for expedited workup of heme positive stool in the setting of worsening anemia. 3 units of packed red cells last Friday and hemoglobin declined by 3 g/dL in 5 days Warfarin on hold Anticipate EGD and colonoscopy on Monday with monitored anesthesia care with Dr. Delfin Edis The nature of the procedure, as well as the risks, benefits, and alternatives were carefully and thoroughly reviewed with the patient. Ample time for discussion and questions allowed. The patient understood, was satisfied, and agreed to proceed.

## 2015-02-19 NOTE — H&P (Signed)
[facility] History and Physical  [name] PYP:[id number] DOB: 06-01-1941 DOA: 02/19/2015  Referring physician: Dr Hilarie Fredrickson PCP: Antony Blackbird, MD   Chief Complaint: Anemia  HPI: [name] is a 74 y.o. male  With history of end-stage renal disease on hemodialysis Monday Wednesday Friday [facility], history of aortic stenosis, history of atrial fibrillation on chronic anticoagulation of Coumadin, diabetes mellitus diet controlled, history of seizures, history of BPH, hypertension, gout, history of CVA who presents from dialysis center with anemia with a hemoglobin of 6.8. Patient had been referred to the gastroenterologist's office for anemia workup and noted at that time to have heme positive stools with a drop in his hemoglobin to about 8.7. Gastroenterologist were awaiting colonoscopy records from [place] prior to doing the EGD or colonoscopy on the patient. One week prior to admission patient was noted to have a hemoglobin in the 7 range and was transfused 3 units of packed red blood cells. Repeat hemoglobin one day prior to admission showed that his hemoglobin was back in the 7 range. INR was subsequently subtherapeutic at that time. Patient had hemodialysis done today and hemoglobin drawn at that time was 6.8. Patient was referred to the hospital to be admitted for further evaluation and management. Patient denies any fevers, no chills, no nausea, no vomiting, no abdominal pain, no diarrhea, no constipation, no chest pain, no shortness of breath, no hematemesis, no hematochezia. Patient does endorse generalized weakness/fatigue. Patient also endorses very dark tarry stools. Patient denies any use of NSAIDs. Patient with leukoplakia. Patient was sent as a direct admission post hemodialysis today.   Review of Systems: As per history of present illness otherwise negative. Constitutional:  No weight loss, night sweats, Fevers, chills, fatigue.    HEENT:  No headaches, Difficulty swallowing,Tooth/dental problems,Sore throat,  No sneezing, itching, ear ache, nasal congestion, post nasal drip,  Cardio-vascular:  No chest pain, Orthopnea, PND, swelling in lower extremities, anasarca, dizziness, palpitations  GI:  No heartburn, indigestion, abdominal pain, nausea, vomiting, diarrhea, change in bowel habits, loss of appetite  Resp:  No shortness of breath with exertion or at rest. No excess mucus, no productive cough, No non-productive cough, No coughing up of blood.No change in color of mucus.No wheezing.No chest wall deformity  Skin:  no rash or lesions.  GU:  no dysuria, change in color of urine, no urgency or frequency. No flank pain.  Musculoskeletal:  No joint pain or swelling. No decreased range of motion. No back pain.  Psych:  No change in mood or affect. No depression or anxiety. No memory loss.   Past Medical History  Diagnosis Date  . Atrial fibrillation     coumadin followed by SNF, longstanding persistent and probably permanent afib  . Aortic stenosis     Echo 02/12/11: moderate aortic stenosis, mean gradient 21,  . Hyperparathyroidism, secondary   . Neuropathy     diabetec  . Hypertension   . Hyperkalemia   . BPH (benign prostatic hypertrophy)   . Carotid bruit     Doppler 7/12: Plaque without significant ICA stenosis  . Bradycardia     asymptomatic  . Atrial enlargement, bilateral   . Type II diabetes mellitus   . DVT (deep venous thrombosis)   . Pneumonia     "couple times" (02/19/2015)  . Sleep apnea     "wore mask for 3-4 years; don't know if I still need to; don't have machine anymore" (02/19/2015)  .  Anemia     chronic disease  . History of blood transfusion 02/12/2015    "related to blood in my stool"  . Seizures     "~ 2006/2007; don't know what kind; took RX for awhile"   . Stroke 1999    "foot drop on left foot since" (02/19/2015)  . History of gout   . ESRD (end stage renal disease) on dialysis      "MWF; [facility]; [place]" (02/19/2015)  . Renal carcinoma ~ 2007    followed at [facility]; s/p gamma knife surgery  . BPH (benign prostatic hyperplasia) 02/19/2015   Past Surgical History  Procedure Laterality Date  . Bunionectomy Bilateral 1981  . Radiofrequency ablation kidney    . Posterior cervical laminectomy       C3-C6 laminectomy with Dr. Vertell Limber and Dr. Joya Salm  . Thrombectomy Bilateral 1999, 2004    "calves"  . Tonsillectomy    . Back surgery    . Av fistula placement Right     forearm  . Av fistula repair  "several times"    "had to put a couple stents in it; cleared it out"   Social History:  reports that he has never smoked. He has never used smokeless tobacco. He reports that he drinks alcohol. He reports that he does not use illicit drugs.  No Known Allergies  Family History  Problem Relation Age of Onset  . Diabetes Mother   . Heart disease Mother   . Hyperlipidemia Mother   . Hypertension Mother   . Diabetes Father   . Hyperlipidemia Father   . Hypertension Father   . Diabetes Sister   . Kidney disease Sister   . Diabetes Brother   . Kidney disease Brother      Prior to Admission medications   Medication Sig Start Date End Date Taking? Authorizing Provider  allopurinol (ZYLOPRIM) 100 MG tablet Take 100 mg by mouth at bedtime.     Historical Provider, MD  B Complex-C-Folic Acid (DIALYVITE TABLET) TABS  04/15/14   Historical Provider, MD  b complex-vitamin c-folic acid (NEPHRO-VITE) 0.8 MG TABS Take 0.8 mg by mouth at bedtime.      Historical Provider, MD  folic acid-vitamin b complex-vitamin c-selenium-zinc (DIALYVITE) 3 MG TABS Take 1 tablet by mouth at bedtime.    Historical Provider, MD  levETIRAcetam (KEPPRA) 500 MG tablet Take 250 mg by mouth at bedtime.     Historical Provider, MD  nitroGLYCERIN (NITROSTAT) 0.4 MG SL tablet Place 0.4 mg under the tongue every 5 (five) minutes as needed. Chest pain    Historical Provider, MD  polyethylene glycol  (MIRALAX / GLYCOLAX) packet Take 17 g by mouth daily. For constipation     Historical Provider, MD  pregabalin (LYRICA) 75 MG capsule Take 75 mg by mouth daily.     Historical Provider, MD  RENVELA 800 MG tablet Take 800 mg by mouth daily. 5 TABS DAILY 04/03/14   Historical Provider, MD  senna (SENOKOT) 8.6 MG TABS Take 1 tablet by mouth daily.      Historical Provider, MD  SENSIPAR 90 MG tablet  01/12/15   Historical Provider, MD  Tamsulosin HCl (FLOMAX) 0.4 MG CAPS Take 0.4 mg by mouth daily after breakfast.     Historical Provider, MD  warfarin (COUMADIN) 5 MG tablet Take 7.5-10 mg by mouth daily. Monday and Friday take two tablets (10mg ). All other days take one and one-half tablets (7.5mg ).    Historical Provider, MD   Physical Exam:  Filed Vitals:   02/19/15 1418  BP: 117/60  Pulse: 54  Temp: 97.7 F (36.5 C)  TempSrc: Oral  Resp: 20  Height: 5\' 6"  (1.676 m)  Weight: 72.9 kg (160 lb 11.5 oz)  SpO2: 100%    Wt Readings from Last 3 Encounters:  02/19/15 72.9 kg (160 lb 11.5 oz)  02/11/15 73.029 kg (161 lb)  06/18/14 73.211 kg (161 lb 6.4 oz)    General:  Well-developed well-nourished laying in bed, in no acute cardiopulmonary distress speaking in full sentences.  Eyes: PERRLA, muddy sclera, normal lids, irises & conjunctiva ENT: grossly normal hearing, lips & tongue Neck: no LAD, masses or thyromegaly Cardiovascular: RRR, 3 out of 6 harsh systolic ejection murmur noted in the right upper sternal border in the left upper sternal border. 2+ bilateral lower extremity edema.  Telemetry: None Respiratory: CTA bilaterally, no w/r/r. Normal respiratory effort. Abdomen: soft, ntnd, positive bowel sounds, no rebound, no guarding. Skin: no rash or induration seen on limited exam Musculoskeletal: grossly normal tone BUE/BLE Psychiatric: grossly normal mood and affect, speech fluent and appropriate Neurologic: Alert and oriented 3. CN 2-12 grossly intact. Sensation is intact. Visual  fields are intact. No focal deficits.           Labs on Admission:  Basic Metabolic Panel: No results for input(s): NA, K, CL, CO2, GLUCOSE, BUN, CREATININE, CALCIUM, MG, PHOS in the last 168 hours. Liver Function Tests: No results for input(s): AST, ALT, ALKPHOS, BILITOT, PROT, ALBUMIN in the last 168 hours. No results for input(s): LIPASE, AMYLASE in the last 168 hours. No results for input(s): AMMONIA in the last 168 hours. CBC: No results for input(s): WBC, NEUTROABS, HGB, HCT, MCV, PLT in the last 168 hours. Cardiac Enzymes: No results for input(s): CKTOTAL, CKMB, CKMBINDEX, TROPONINI in the last 168 hours.  BNP (last 3 results) No results for input(s): BNP in the last 8760 hours.  ProBNP (last 3 results) No results for input(s): PROBNP in the last 8760 hours.  CBG: No results for input(s): GLUCAP in the last 168 hours.  Radiological Exams on Admission: No results found.  EKG: None  Assessment/Plan updated patient. No updated patient and daughter at bedside. Updated updated Principal Problem:   Anemia Active Problems:   End stage renal disease   Atrial fibrillation   Aortic stenosis   Diabetes mellitus   Hypertension   Cervical myelopathy   Cerebral thrombosis with cerebral infarction   Seizures   BPH (benign prostatic hyperplasia)   Gout  #1 anemia/??GIB Questionable etiology. Patient has had a drop in his hemoglobin and was transfused 3 units of packed red blood cells one week prior to admission. Patient's hemoglobin subsequently went back down to about 6.8 today. Differential includes upper GI bleed versus lower GI bleed versus secondary to aortic stenosis. Patient with positive FOBT. Hold Coumadin. Will type and screen. Check anemia panel. Check a CBC, comprehensive metabolic profile, magnesium, type and screen. Transfused 2 units packed red blood cells during hemodialysis. Patient has been seen by gastroenterology, Velora Heckler who feels patient would likely need  EGD/colonoscopy with MAC on Monday. Per GI.  #2 end-stage renal disease on hemodialysis Patient's hemodialysis days are Monday Wednesday Friday. Consult nephrology for further evaluation and management.  #3 Atrial fibrillation Currently rate controlled. Will hold off on anticoagulation as patient with recent bleed.  #4 aortic stenosis Patient with progressive aortic stenosis with a harsh systolic murmur. Currently asymptomatic. Outpatient follow-up with cardiology.  #5 diabetes mellitus Diet controlled. Check  a hemoglobin A1c. Place on SSI.  #6 seizures Stable. Continue home regimen of Keppra.  #7 BPH Stable. Continue Flomax.  #9 gout Stable. Continue allopurinol.  #10 hypertension Stable. Follow for now.  #11 prophylaxis PPI for GI prophylaxis. SCDs for DVT prophylaxis.  4   Code Status: Full DVT Prophylaxis: SCDs Family Communication: updated patient and daughter at bedside. Disposition Plan: Admit to telemetry.  Time spent: 21 mins  [facility] MD [facility] Pager [phone]

## 2015-02-19 NOTE — Progress Notes (Signed)
Chaplain responded to consult  Patient and daughter very nice.Father would like more information about what is going on with his health. The daughter would like to read over the page work for completing an advance directive and speak  with   02/19/15 1515  Clinical Encounter Type  Visited With Patient and family together;Health care provider  Visit Type Initial;Spiritual support  Referral From Nurse  Consult/Referral To [facility] (For Healthcare)  Does patient have an advance directive? No  Would patient like information on creating an advanced directive? Yes - Educational materials given   chaplain  Monday.

## 2015-02-19 NOTE — Telephone Encounter (Signed)
Dr. Hilarie Fredrickson have you gotten any results on this pt today?

## 2015-02-19 NOTE — Telephone Encounter (Signed)
Yes he has been admitted to [facility] for inpatient eval GI team will see him today Expect EGD/colon on Monday with MAC to eval heme + stools and anemia.  Also needs MAC due to severe aortic stenosis

## 2015-02-19 NOTE — Progress Notes (Signed)
Discussed with Dr. Hilarie Fredrickson on 7/14.  Discussed with Wallace Going on 7/15. Patient is a 74 year old black male with history of end-stage renal disease, atrial fibrillation on Coumadin, severe aortic stenosis, diabetes. He was seen in their office recently for heme positive stool. He remains transfusion dependent since then and has received 3 units of packed red blood cells. GI is concerned that the bleed may be more brisk than initially thought. INR today is reportedly subtherapeutic at 1.5. And hemoglobin of about 6. Needs admission to hospitalists for blood transfusion and EGD and colonoscopy. GI will consult and will likely perform the procedures under anesthesia on Monday. Primary care provider is Dr. Chapman Fitch.  On arrival to the floor, please notify flow manager who will paged the hospitalist to evaluate the patient and write orders.  Doree Barthel, MD

## 2015-02-19 NOTE — Telephone Encounter (Signed)
no

## 2015-02-19 NOTE — Telephone Encounter (Signed)
Do I need to do anything regarding orders/scheduling?

## 2015-02-19 NOTE — Progress Notes (Signed)
Juanell Fairly PA called and spoke with pt"s son in law about going to hemodialysis tonight instead of tomorrow because PT"s HGB is 5.9 and he will receive two units in hemo tonight.

## 2015-02-20 LAB — CBC
HCT: 24.7 % — ABNORMAL LOW (ref 39.0–52.0)
HEMATOCRIT: 21.7 % — AB (ref 39.0–52.0)
HEMOGLOBIN: 6.8 g/dL — AB (ref 13.0–17.0)
Hemoglobin: 7.9 g/dL — ABNORMAL LOW (ref 13.0–17.0)
MCH: 27.6 pg (ref 26.0–34.0)
MCH: 28 pg (ref 26.0–34.0)
MCHC: 31.3 g/dL (ref 30.0–36.0)
MCHC: 32 g/dL (ref 30.0–36.0)
MCV: 88.2 fL (ref 78.0–100.0)
MCV: 87.6 fL (ref 78.0–100.0)
Platelets: 143 10*3/uL — ABNORMAL LOW (ref 150–400)
Platelets: 158 10*3/uL (ref 150–400)
RBC: 2.46 MIL/uL — ABNORMAL LOW (ref 4.22–5.81)
RBC: 2.82 MIL/uL — ABNORMAL LOW (ref 4.22–5.81)
RDW: 18.3 % — ABNORMAL HIGH (ref 11.5–15.5)
RDW: 18.8 % — ABNORMAL HIGH (ref 11.5–15.5)
WBC: 5.3 10*3/uL (ref 4.0–10.5)
WBC: 5.3 10*3/uL (ref 4.0–10.5)

## 2015-02-20 LAB — RENAL FUNCTION PANEL
Albumin: 2.6 g/dL — ABNORMAL LOW (ref 3.5–5.0)
Anion gap: 6 (ref 5–15)
BUN: 31 mg/dL — ABNORMAL HIGH (ref 6–20)
CHLORIDE: 97 mmol/L — AB (ref 101–111)
CO2: 32 mmol/L (ref 22–32)
Calcium: 8.1 mg/dL — ABNORMAL LOW (ref 8.9–10.3)
Creatinine, Ser: 4.9 mg/dL — ABNORMAL HIGH (ref 0.61–1.24)
GFR calc non Af Amer: 11 mL/min — ABNORMAL LOW (ref >60)
GFR, EST AFRICAN AMERICAN: 12 mL/min — AB (ref >60)
Glucose, Bld: 67 mg/dL (ref 65–99)
Phosphorus: 4.6 mg/dL (ref 2.5–4.6)
Potassium: 4.3 mmol/L (ref 3.5–5.1)
Sodium: 135 mmol/L (ref 135–145)

## 2015-02-20 LAB — HEMOGLOBIN A1C
HEMOGLOBIN A1C: 5.3 % (ref 4.8–5.6)
Mean Plasma Glucose: 105 mg/dL

## 2015-02-20 LAB — PROTIME-INR
INR: 1.62 — AB (ref 0.00–1.49)
PROTHROMBIN TIME: 19.3 s — AB (ref 11.6–15.2)

## 2015-02-20 LAB — GLUCOSE, CAPILLARY
GLUCOSE-CAPILLARY: 104 mg/dL — AB (ref 65–99)
Glucose-Capillary: 75 mg/dL (ref 65–99)
Glucose-Capillary: 99 mg/dL (ref 65–99)
Glucose-Capillary: 114 mg/dL — ABNORMAL HIGH (ref 65–99)

## 2015-02-20 LAB — MRSA PCR SCREENING: MRSA by PCR: NEGATIVE

## 2015-02-20 MED ORDER — LIDOCAINE HCL (PF) 1 % IJ SOLN: 5.0000 mL | INTRAMUSCULAR | Status: DC | PRN

## 2015-02-20 MED ORDER — SODIUM CHLORIDE 0.9 % IV SOLN: 100.0000 mL | INTRAVENOUS | Status: DC | PRN

## 2015-02-20 MED ORDER — HYDROXYZINE HCL 25 MG PO TABS: 25.0000 mg | ORAL_TABLET | Freq: Three times a day (TID) | ORAL | Status: DC | PRN

## 2015-02-20 MED ORDER — NEPRO/CARBSTEADY PO LIQD
237.0000 mL | ORAL | Status: DC | PRN
  Filled 2015-02-20: qty 237

## 2015-02-20 MED ORDER — CALCIUM CARBONATE 1250 MG/5ML PO SUSP: 500.0000 mg | Freq: Four times a day (QID) | ORAL | Status: DC | PRN

## 2015-02-20 MED ORDER — PENTAFLUOROPROP-TETRAFLUOROETH EX AERO: 1.0000 "application " | INHALATION_SPRAY | CUTANEOUS | Status: DC | PRN

## 2015-02-20 MED ORDER — CAMPHOR-MENTHOL 0.5-0.5 % EX LOTN
TOPICAL_LOTION | CUTANEOUS | Status: DC | PRN
  Filled 2015-02-20: qty 222

## 2015-02-20 MED ORDER — ALTEPLASE 2 MG IJ SOLR
2.0000 mg | Freq: Once | INTRAMUSCULAR | Status: DC | PRN
  Filled 2015-02-20: qty 2

## 2015-02-20 MED ORDER — HEPARIN SODIUM (PORCINE) 1000 UNIT/ML DIALYSIS: 1000.0000 [IU] | INTRAMUSCULAR | Status: DC | PRN

## 2015-02-20 MED ORDER — NEPRO/CARBSTEADY PO LIQD
237.0000 mL | Freq: Three times a day (TID) | ORAL | Status: DC | PRN
Start: 2015-02-20 — End: 2015-02-21

## 2015-02-20 MED ORDER — DOCUSATE SODIUM 283 MG RE ENEM
1.0000 | ENEMA | RECTAL | Status: DC | PRN
Start: 2015-02-20 — End: 2015-02-23
  Filled 2015-02-20: qty 1

## 2015-02-20 MED ORDER — ZOLPIDEM TARTRATE 5 MG PO TABS: 5.0000 mg | ORAL_TABLET | Freq: Every evening | ORAL | Status: DC | PRN

## 2015-02-20 MED ORDER — SORBITOL 70 % SOLN
30.0000 mL | Status: DC | PRN
  Administered 2015-02-20: 30 mL via ORAL
  Filled 2015-02-20: qty 30

## 2015-02-20 MED ORDER — LIDOCAINE-PRILOCAINE 2.5-2.5 % EX CREA: 1.0000 "application " | TOPICAL_CREAM | CUTANEOUS | Status: DC | PRN

## 2015-02-20 MED ORDER — CAMPHOR-MENTHOL 0.5-0.5 % EX LOTN
1.0000 "application " | TOPICAL_LOTION | Freq: Three times a day (TID) | CUTANEOUS | Status: DC | PRN
  Filled 2015-02-20: qty 222

## 2015-02-20 NOTE — Progress Notes (Signed)
Assessment/Plan: 1. Anemia/+FOBT: Transfused and HD last PM Endoscopy scheduled for 02/22/15.  2. ESRD - Hemodialysis MWF at [facility]. Monday next tx and will remove volume  3. Hypertension/volume - No BP meds at home. 4. Metabolic bone disease - Phos 5.2, Ca 9.17m C Ca 10 (02/17/15) PTH 213 (01/20/15). Continue Renvela but reduce dose while in hospital to 2400 mg PO TID with meals. Continue sensipar.  5. Nutrition - Albumin 3.5 (02/17/15) Renal diet/Renal vit.  6. Afib: Coumadin on hold. 7. DM: No home meds. Will follow QID BS per primary with S/S insulin as needed.  Subjective: Interval History: feels better after blood  Objective: Vital signs in last 24 hours: Temp:  [97.7 F (36.5 C)-98.7 F (37.1 C)] 98.3 F (36.8 C) (07/16 0500) Pulse Rate:  [46-73] 71 (07/16 0500) Resp:  [14-20] 16 (07/16 0500) BP: (100-125)/(52-74) 102/71 mmHg (07/16 0500) SpO2:  [100 %] 100 % (07/16 0500) Weight:  [72.8 kg (160 lb 7.9 oz)-74.3 kg (163 lb 12.8 oz)] 72.8 kg (160 lb 7.9 oz) (07/16 0430) Weight change:   Intake/Output from previous day: 07/15 0701 - 07/16 0700 In: 1118 [P.O.:480; Blood:638] Out: 1501 [Stool:1] Intake/Output this shift: Total I/O In: 480 [P.O.:480] Out: -   General appearance: alert and cooperative Chest wall: no tenderness Lungs clear Cardio: regular rate and rhythm, S1, S2 normal, no murmur, click, rub or gallop Extremities: edema 2+  Lab Results:  Recent Labs  02/19/15 1655 02/20/15 0229  WBC 4.8 5.3  HGB 5.9* 6.8*  HCT 18.7* 21.7*  PLT 140* 143*   BMET:  Recent Labs  02/19/15 1655 02/20/15 0230  NA 139 135  K 3.9 4.3  CL 98* 97*  CO2 35* 32  GLUCOSE 81 67  BUN 24* 31*  CREATININE 4.00* 4.90*  CALCIUM 7.9* 8.1*   No results for input(s): PTH in the last 72 hours. Iron Studies:  Recent Labs  02/19/15 1655  IRON 27*  TIBC 189*  FERRITIN 771*   Studies/Results: No results found.  Scheduled: . sodium chloride   Intravenous Once  .  allopurinol  100 mg Oral QHS  . cinacalcet  90 mg Oral Q breakfast  . [START ON 02/22/2015] ferric gluconate (FERRLECIT/NULECIT) IV  125 mg Intravenous Q M,W,F-HD  . insulin aspart  0-9 Units Subcutaneous TID WC  . levETIRAcetam  250 mg Oral QHS  . multivitamin  1 tablet Oral QHS  . pantoprazole (PROTONIX) IV  40 mg Intravenous Q12H  . polyethylene glycol  17 g Oral Daily  . senna  1 tablet Oral Daily  . sevelamer carbonate  2,400 mg Oral TID WC  . sodium chloride  3 mL Intravenous Q12H  . sodium chloride  3 mL Intravenous Q12H  . tamsulosin  0.4 mg Oral QPC breakfast      LOS: 1 day   Gunter Conde C 02/20/2015,12:32 PM

## 2015-02-20 NOTE — Evaluation (Signed)
Physical Therapy Evaluation Patient Details Name: [name] MRN: [id number] DOB: [birth year] Today's Date: 02/20/2015   History of Present Illness  P is a 74 y/o male with a history of end-stage renal disease on hemodialysis Monday Wednesday Friday.  He has a history of aortic stenosis, atrial fibrillation on chronic anticoagulation of Coumadin, diabetes mellitus diet controlled, seizures, BPH, hypertension, gout, CVA who presents from dialysis center with anemia with a hemoglobin of 6.8.  Pt has received units of blood.  Work up in progress.  Clinical Impression  Pt admitted with/for anemia and low Hbg.  Pt currently limited functionally due to the problems listed below.  (see problems list.)  Pt will benefit from PT to maximize function and safety to be able to get home safely with available assist of family.     Follow Up Recommendations Supervision for mobility/OOB;No PT follow up    Equipment Recommendations  None recommended by PT    Recommendations for Other Services       Precautions / Restrictions        Mobility  Bed Mobility Overal bed mobility: Needs Assistance Bed Mobility: Supine to Sit;Sit to Supine     Supine to sit: Min guard Sit to supine: Min assist   General bed mobility comments: no use of rails.  assisted LE's into bed.  Transfers Overall transfer level: Needs assistance   Transfers: Sit to/from Stand Sit to Stand: Supervision         General transfer comment: uses UE's appropriately  Ambulation/Gait Ambulation/Gait assistance: Min guard Ambulation Distance (Feet): 50 Feet Assistive device: Rolling walker (2 wheeled) Gait Pattern/deviations: Step-to pattern Gait velocity: slow   General Gait Details: foot drop and hip hike for clearance on the left.  steady otherwise.  Stairs            Wheelchair Mobility    Modified Rankin (Stroke Patients Only)       Balance Overall balance assessment: Needs assistance Sitting-balance  support: No upper extremity supported Sitting balance-Leahy Scale: Fair     Standing balance support: Single extremity supported;Bilateral upper extremity supported Standing balance-Leahy Scale: Fair                               Pertinent Vitals/Pain Pain Assessment: No/denies pain    Home Living Family/patient expects to be discharged to:: Private residence Living Arrangements: Children;Other relatives Available Help at Discharge: Family Type of Home: House Home Access: Stairs to enter Entrance Stairs-Rails: None Entrance Stairs-Number of Steps: 3 Home Layout: One level Home Equipment: Walker - 4 wheels      Prior Function Level of Independence: Independent with assistive device(s)         Comments: family takes him to HD     Hand Dominance        Extremity/Trunk Assessment   Upper Extremity Assessment: Defer to OT evaluation           Lower Extremity Assessment: Generalized weakness;LLE deficits/detail ( )   LLE Deficits / Details: L foot drop and hip hiking to clear foot.     Communication   Communication: No difficulties  Cognition Arousal/Alertness: Awake/alert Behavior During Therapy: WFL for tasks assessed/performed Overall Cognitive Status: Within Functional Limits for tasks assessed                      General Comments      Exercises  Assessment/Plan    PT Assessment Patient needs continued PT services  PT Diagnosis Abnormality of gait   PT Problem List Decreased strength;Decreased activity tolerance;Decreased balance;Decreased mobility  PT Treatment Interventions DME instruction;Gait training;Functional mobility training;Balance training;Patient/family education   PT Goals (Current goals can be found in the Care Plan section) Acute Rehab PT Goals Patient Stated Goal: home as mobile as PTA PT Goal Formulation: With patient Time For Goal Achievement: 02/27/15 Potential to Achieve Goals: Good     Frequency Min 3X/week   Barriers to discharge        Co-evaluation               End of Session   Activity Tolerance: Patient tolerated treatment well Patient left: in bed;with call bell/phone within reach Nurse Communication: Mobility status         Time: 1440-1459 PT Time Calculation (min) (ACUTE ONLY): 19 min   Charges:   PT Evaluation $Initial PT Evaluation Tier I: 1 Procedure     PT G Codes:        Halla Chopp, Tessie Fass 02/20/2015, 3:14 PM 02/20/2015  Donnella Sham, PT [phone]  (pager)

## 2015-02-20 NOTE — Progress Notes (Signed)
Cross cover [facility]-S Subjective: Patient denies having any GI complaints at this time. He is waiting to have an EGD/Colonoscopy.   Objective: Vital signs in last 24 hours: Temp:  [97.7 F (36.5 C)-98.7 F (37.1 C)] 98.3 F (36.8 C) (07/16 0500) Pulse Rate:  [46-73] 71 (07/16 0500) Resp:  [14-20] 16 (07/16 0500) BP: (100-125)/(52-74) 102/71 mmHg (07/16 0500) SpO2:  [100 %] 100 % (07/16 0500) Weight:  [72.8 kg (160 lb 7.9 oz)-74.3 kg (163 lb 12.8 oz)] 72.8 kg (160 lb 7.9 oz) (07/16 0430)   Intake/Output from previous day: 07/15 0701 - 07/16 0700 In: 1118 [P.O.:480; Blood:638] Out: 1501 [Stool:1] Intake/Output this shift:   General appearance: alert and no distress Resp: clear to auscultation bilaterally Cardio: regular rate and rhythm, S1, S2 normal, loud systolic murmur present GI: soft, non-tender; bowel sounds normal; no masses,  no organomegaly  Lab Results:  Recent Labs  02/19/15 1655 02/20/15 0229  WBC 4.8 5.3  HGB 5.9* 6.8*  HCT 18.7* 21.7*  PLT 140* 143*   BMET  Recent Labs  02/19/15 1655 02/20/15 0230  NA 139 135  K 3.9 4.3  CL 98* 97*  CO2 35* 32  GLUCOSE 81 67  BUN 24* 31*  CREATININE 4.00* 4.90*  CALCIUM 7.9* 8.1*   LFT  Recent Labs  02/19/15 1655 02/20/15 0230  PROT 5.2*  --   ALBUMIN 2.6* 2.6*  AST 19  --   ALT 20  --   ALKPHOS 46  --   BILITOT 0.2*  --    PT/INR  Recent Labs  02/19/15 1655 02/20/15 0700  LABPROT 20.3* 19.3*  INR 1.74* 1.62*   Medications: I have reviewed the patient's current medications.  Assessment/Plan: Guaiac positive stools with anemia-complicated by aortic stenosis-on Coumadin for atrial fibrillation. Will let Dr. Olevia Perches decide on the timing of the procedure tomorrow.  LOS: 1 day   Quintara Bost 02/20/2015, 10:03 AM

## 2015-02-20 NOTE — Progress Notes (Signed)
UR Completed. Azile Minardi, RN, BSN.  [phone] 

## 2015-02-20 NOTE — Progress Notes (Addendum)
Patient ID: [name], male   DOB: 1940/08/11, 74 y.o.   MRN: [id number]  [facility] PROGRESS NOTE  [name]:[id number] DOB: 11-06-1940 DOA: 02/19/2015 PCP: Antony Blackbird, MD   Brief narrative:    74 y.o. male with ESRD on HD MWF, aortic stenosis, a-fib on AC with Coumadin, DM diet controlled, who presented from HD center after noted to have Hg 6.8.  Assessment/Plan:    Principal Problem:   Acute on chronic blood loss anemia - Iron level and TIBC both low on anemia panel - GI team has been consulted, appreciate assistance, will continue to follow up on recommendations  - pt hs been transfused 2 U PRBC, Hg up from 6.8 --> 7.9 - endoscopy scheduled for Monday   Active Problems:   End stage renal disease, MWF HD - nephrology team following, next HD Monday     Atrial fibrillation, rate controlled with CHDS2 at least 4-5 - on Coumadin at home, INR 1.62 this AM - AC still on hold     Diabetes mellitus, diet controlled - reasonable inpatient control - A1C pending - continue SSI     Thrombocytopenia - unclear etiology - now WNL - repeat CBC in AM    Cerebral thrombosis with cerebral infarction    Seizures - no seizures wile inpatient  - continue Keppra    Metabolic bone disease - continue renvela 2400 mg PO TID - continue sensipar  DVT prophylaxis - SCD's  Code Status: Full.  Family Communication:  plan of care discussed with the patient Disposition Plan: Home when stable.   IV access:  Peripheral IV  Procedures and diagnostic studies:    None   Medical Consultants:  GI Nephrology   Other Consultants:  None  IAnti-Infectives:   None  Faye Ramsay, MD  [facility] Pager [phone]  If 7PM-7AM, please contact night-coverage [email or web address] Password [email or web address] 02/20/2015, 3:03 PM   LOS: 1 day   HPI/Subjective: No events overnight.   Objective: Filed Vitals:   02/20/15 0400 02/20/15 0430 02/20/15 0445 02/20/15 0500  BP: 125/63 115/71  108/65 102/71  Pulse: 56 58 59 71  Temp:  98 F (36.7 C)  98.3 F (36.8 C)  TempSrc:  Oral  Oral  Resp: 17 16 14 16   Height:      Weight:  72.8 kg (160 lb 7.9 oz)    SpO2:  100%  100%    Intake/Output Summary (Last 24 hours) at 02/20/15 1503 Last data filed at 02/20/15 0900  Gross per 24 hour  Intake   1598 ml  Output   1501 ml  Net     97 ml    Exam:   General:  Pt is alert, follows commands appropriately, not in acute distress  Cardiovascular: Regular rate and rhythm, SEM 2/6, no rubs, no gallops  Respiratory: Clear to auscultation bilaterally, no wheezing, diminished breath sounds at bases   Abdomen: Soft, non tender, non distended, bowel sounds present, no guarding  Extremities: No edema, pulses DP and PT palpable bilaterally  Data Reviewed: Basic Metabolic Panel:  Recent Labs Lab 02/19/15 1655 02/20/15 0230  NA 139 135  K 3.9 4.3  CL 98* 97*  CO2 35* 32  GLUCOSE 81 67  BUN 24* 31*  CREATININE 4.00* 4.90*  CALCIUM 7.9* 8.1*  MG 2.1  --   PHOS 3.5 4.6   Liver Function Tests:  Recent Labs Lab 02/19/15 1655 02/20/15 0230  AST 19  --   ALT 20  --  ALKPHOS 46  --   BILITOT 0.2*  --   PROT 5.2*  --   ALBUMIN 2.6* 2.6*   CBC:  Recent Labs Lab 02/19/15 1655 02/20/15 0229 02/20/15 1235  WBC 4.8 5.3 5.3  NEUTROABS 2.8  --   --   HGB 5.9* 6.8* 7.9*  HCT 18.7* 21.7* 24.7*  MCV 89.0 88.2 87.6  PLT 140* 143* 158   CBG:  Recent Labs Lab 02/19/15 1619 02/19/15 2210 02/20/15 0844 02/20/15 1236  GLUCAP 78 97 75 104*    Recent Results (from the past 240 hour(s))  MRSA PCR Screening     Status: None   Collection Time: 02/20/15  6:42 AM  Result Value Ref Range Status   MRSA by PCR NEGATIVE NEGATIVE Final    Comment:        The GeneXpert MRSA Assay (FDA approved for NASAL specimens only), is one component of a comprehensive MRSA colonization surveillance program. It is not intended to diagnose MRSA infection nor to guide or monitor  treatment for MRSA infections.      Scheduled Meds: . sodium chloride   Intravenous Once  . allopurinol  100 mg Oral QHS  . cinacalcet  90 mg Oral Q breakfast  . insulin aspart  0-9 Units Subcutaneous TID WC  . levETIRAcetam  250 mg Oral QHS  . multivitamin  1 tablet Oral QHS  . pantoprazole (PROTONIX) IV  40 mg Intravenous Q12H  . polyethylene glycol  17 g Oral Daily  . senna  1 tablet Oral Daily  . sevelamer carbonate  2,400 mg Oral TID WC  . tamsulosin  0.4 mg Oral QPC breakfast   Continuous Infusions:

## 2015-02-21 LAB — GLUCOSE, CAPILLARY
GLUCOSE-CAPILLARY: 90 mg/dL (ref 65–99)
Glucose-Capillary: 86 mg/dL (ref 65–99)
Glucose-Capillary: 129 mg/dL — ABNORMAL HIGH (ref 65–99)
Glucose-Capillary: 121 mg/dL — ABNORMAL HIGH (ref 65–99)

## 2015-02-21 LAB — TYPE AND SCREEN
ABO/RH(D): O POS
Antibody Screen: NEGATIVE
UNIT DIVISION: 0
UNIT DIVISION: 0

## 2015-02-21 LAB — CBC
HCT: 26.1 % — ABNORMAL LOW (ref 39.0–52.0)
Hemoglobin: 8.4 g/dL — ABNORMAL LOW (ref 13.0–17.0)
MCH: 28 pg (ref 26.0–34.0)
MCHC: 32.2 g/dL (ref 30.0–36.0)
MCV: 87 fL (ref 78.0–100.0)
PLATELETS: 163 10*3/uL (ref 150–400)
RBC: 3 MIL/uL — ABNORMAL LOW (ref 4.22–5.81)
RDW: 18.1 % — AB (ref 11.5–15.5)
WBC: 5.7 10*3/uL (ref 4.0–10.5)

## 2015-02-21 LAB — BASIC METABOLIC PANEL
ANION GAP: 7 (ref 5–15)
BUN: 31 mg/dL — ABNORMAL HIGH (ref 6–20)
CHLORIDE: 97 mmol/L — AB (ref 101–111)
CO2: 31 mmol/L (ref 22–32)
Calcium: 8.6 mg/dL — ABNORMAL LOW (ref 8.9–10.3)
Creatinine, Ser: 5.36 mg/dL — ABNORMAL HIGH (ref 0.61–1.24)
GFR calc non Af Amer: 9 mL/min — ABNORMAL LOW (ref >60)
GFR, EST AFRICAN AMERICAN: 11 mL/min — AB (ref >60)
Glucose, Bld: 80 mg/dL (ref 65–99)
POTASSIUM: 4.4 mmol/L (ref 3.5–5.1)
Sodium: 135 mmol/L (ref 135–145)

## 2015-02-21 MED ORDER — BISACODYL 10 MG RE SUPP
10.0000 mg | Freq: Two times a day (BID) | RECTAL | Status: DC
  Administered 2015-02-21 – 2015-02-23 (×2): 10 mg via RECTAL
  Filled 2015-02-21 (×2): qty 1

## 2015-02-21 MED ORDER — NEPRO/CARBSTEADY PO LIQD
237.0000 mL | Freq: Two times a day (BID) | ORAL | Status: DC
  Administered 2015-02-21 – 2015-02-23 (×3): 237 mL via ORAL

## 2015-02-21 MED ORDER — DARBEPOETIN ALFA 200 MCG/0.4ML IJ SOSY: 200.0000 ug | PREFILLED_SYRINGE | INTRAMUSCULAR | Status: DC

## 2015-02-21 MED ORDER — POLYETHYLENE GLYCOL 3350 17 GM/SCOOP PO POWD
1.0000 | Freq: Once | ORAL | Status: AC
  Administered 2015-02-21: 255 g via ORAL
  Filled 2015-02-21: qty 255

## 2015-02-21 NOTE — Progress Notes (Addendum)
Patient ID: [name], male   DOB: Dec 21, 1940, 74 y.o.   MRN: [id number]  [facility] PROGRESS NOTE  ULYSEE FYOCK VHQ:[id number] DOB: 1941/07/12 DOA: 02/19/2015 PCP: Antony Blackbird, MD   Brief narrative:    74 y.o. male with ESRD on HD MWF, aortic stenosis, a-fib on AC with Coumadin, DM diet controlled, who presented from HD center after noted to have Hg 6.8.  Assessment/Plan:    Principal Problem:   Acute on chronic blood loss anemia - Iron level and TIBC both low on anemia panel - GI team has been consulted, appreciate assistance, will continue to follow up on recommendations  - pt hs been transfused 2 U PRBC, Hg up from 6.8 --> 7.9 --> 8.4 - endoscopy scheduled for Monday   Active Problems:   End stage renal disease, MWF HD - nephrology team following, next HD Monday     Atrial fibrillation, rate controlled with CHDS2 at least 4-5 - on Coumadin at home - [facility] still on hold for now     Diabetes mellitus, diet controlled - reasonable inpatient control - A1C 5.3 - continue SSI     Bradycardia - asymptomatic - keep on telemetry for 24 more hours     Thrombocytopenia - unclear etiology - now WNL - repeat CBC in AM    Cerebral thrombosis with cerebral infarction    Seizures - no seizures wile inpatient  - continue Keppra    Metabolic bone disease - continue renvela 2400 mg PO TID - continue sensipar  DVT prophylaxis - SCD's  Code Status: Full.  Family Communication:  plan of care discussed with the patient Disposition Plan: Home when stable. Needs colonoscopy on Monday   IV access:  Peripheral IV  Procedures and diagnostic studies:    None   Medical Consultants:  GI Nephrology   Other Consultants:  None  IAnti-Infectives:   None  Faye Ramsay, MD  [facility] Pager [phone]  If 7PM-7AM, please contact night-coverage [email or web address] Password [email or web address] 02/21/2015, 11:06 AM   LOS: 2 days   HPI/Subjective: No events overnight.    Objective: Filed Vitals:   02/20/15 0500 02/20/15 1637 02/20/15 2146 02/21/15 0538  BP: 102/71 133/62 134/76 148/68  Pulse: 71 55 51 51  Temp: 98.3 F (36.8 C) 98.3 F (36.8 C) 98.1 F (36.7 C) 98.1 F (36.7 C)  TempSrc: Oral Oral Oral Oral  Resp: 16 16 18 18   Height:      Weight:      SpO2: 100% 100% 100% 100%    Intake/Output Summary (Last 24 hours) at 02/21/15 1106 Last data filed at 02/21/15 0900  Gross per 24 hour  Intake    600 ml  Output      0 ml  Net    600 ml    Exam:   General:  Pt is alert, follows commands appropriately, not in acute distress  Cardiovascular: Regular rhythm, bradycardic, SEM 2/6, no rubs, no gallops  Respiratory: Clear to auscultation bilaterally, no wheezing, diminished breath sounds at bases   Abdomen: Soft, non tender, non distended, bowel sounds present, no guarding  Extremities: No edema, pulses DP and PT palpable bilaterally  Data Reviewed: Basic Metabolic Panel:  Recent Labs Lab 02/19/15 1655 02/20/15 0230 02/21/15 0554  NA 139 135 135  K 3.9 4.3 4.4  CL 98* 97* 97*  CO2 35* 32 31  GLUCOSE 81 67 80  BUN 24* 31* 31*  CREATININE 4.00* 4.90* 5.36*  CALCIUM 7.9* 8.1* 8.6*  MG  2.1  --   --   PHOS 3.5 4.6  --    Liver Function Tests:  Recent Labs Lab 02/19/15 1655 02/20/15 0230  AST 19  --   ALT 20  --   ALKPHOS 46  --   BILITOT 0.2*  --   PROT 5.2*  --   ALBUMIN 2.6* 2.6*   CBC:  Recent Labs Lab 02/19/15 1655 02/20/15 0229 02/20/15 1235 02/21/15 0554  WBC 4.8 5.3 5.3 5.7  NEUTROABS 2.8  --   --   --   HGB 5.9* 6.8* 7.9* 8.4*  HCT 18.7* 21.7* 24.7* 26.1*  MCV 89.0 88.2 87.6 87.0  PLT 140* 143* 158 163   CBG:  Recent Labs Lab 02/20/15 0844 02/20/15 1236 02/20/15 1635 02/20/15 2144 02/21/15 0753  GLUCAP 75 104* 99 114* 86    Recent Results (from the past 240 hour(s))  MRSA PCR Screening     Status: None   Collection Time: 02/20/15  6:42 AM  Result Value Ref Range Status   MRSA by PCR  NEGATIVE NEGATIVE Final    Comment:        The GeneXpert MRSA Assay (FDA approved for NASAL specimens only), is one component of a comprehensive MRSA colonization surveillance program. It is not intended to diagnose MRSA infection nor to guide or monitor treatment for MRSA infections.      Scheduled Meds: . sodium chloride   Intravenous Once  . allopurinol  100 mg Oral QHS  . cinacalcet  90 mg Oral Q breakfast  . insulin aspart  0-9 Units Subcutaneous TID WC  . levETIRAcetam  250 mg Oral QHS  . multivitamin  1 tablet Oral QHS  . pantoprazole (PROTONIX) IV  40 mg Intravenous Q12H  . polyethylene glycol  17 g Oral Daily  . senna  1 tablet Oral Daily  . sevelamer carbonate  2,400 mg Oral TID WC  . tamsulosin  0.4 mg Oral QPC breakfast   Continuous Infusions:

## 2015-02-21 NOTE — Evaluation (Signed)
Occupational Therapy Evaluation Patient Details Name: [name] MRN: [id number] DOB: 14-Sep-1940 Today's Date: 02/21/2015    History of Present Illness P is a 74 y/o male with a history of end-stage renal disease on hemodialysis Monday Wednesday Friday.  He has a history of aortic stenosis, atrial fibrillation on chronic anticoagulation of Coumadin, diabetes mellitus diet controlled, seizures, BPH, hypertension, gout, CVA who presents from dialysis center with anemia with a hemoglobin of 6.8.  Pt has received units of blood.  Work up in progress.   Clinical Impression   This 74 yo male admitted with above presents to acute OT with decreased mobility, decreased balance, and decreased overall strength. He will benefit from acute OT without need for follow up.     Follow Up Recommendations  No OT follow up    Equipment Recommendations  None recommended by OT       Precautions / Restrictions Precautions Precautions: Fall Restrictions Weight Bearing Restrictions: No      Mobility Bed Mobility               General bed mobility comments: Pt up in recliner  Transfers Overall transfer level: Needs assistance   Transfers: Squat Pivot Transfers     Squat pivot transfers: Min guard (bed<>recliner to simulate bed/recliner<>BSC; increase time/effort to come up to partial stand and decreased control from partial stand to sit)          Balance   Sitting-balance support: No upper extremity supported;Feet supported Sitting balance-Leahy Scale: Good     Standing balance support: Bilateral upper extremity supported;During functional activity Standing balance-Leahy Scale: Poor                              ADL Overall ADL's : Needs assistance/impaired Eating/Feeding: Independent;Sitting   Grooming: Set up;Sitting   Upper Body Bathing: Set up;Sitting   Lower Body Bathing: Min guard;Sit to/from stand   Upper Body Dressing : Set up;Sitting   Lower Body  Dressing: Min guard;Sit to/from stand   Toilet Transfer: Min guard;Squat-pivot;BSC                   Vision Additional Comments: No change from baselin          Pertinent Vitals/Pain Pain Assessment: No/denies pain     Hand Dominance Right   Extremity/Trunk Assessment Upper Extremity Assessment Upper Extremity Assessment: Overall WFL for tasks assessed           Communication Communication Communication: No difficulties   Cognition Arousal/Alertness: Awake/alert Behavior During Therapy: WFL for tasks assessed/performed Overall Cognitive Status: Within Functional Limits for tasks assessed                                Home Living Family/patient expects to be discharged to:: Private residence Living Arrangements: Children;Other relatives Available Help at Discharge: Family;Available 24 hours/day Type of Home: House Home Access: Stairs to enter [employer] of Steps: 3 Entrance Stairs-Rails: None Home Layout: One level     Bathroom Shower/Tub: Walk-in Hydrologist: Standard     Home Equipment: Environmental consultant - 4 wheels;Shower seat          Prior Functioning/Environment Level of Independence: Independent with assistive device(s)        Comments: family takes him to HD    OT Diagnosis: Generalized weakness   OT Problem List: Decreased strength;Impaired balance (  sitting and/or standing)   OT Treatment/Interventions: Self-care/ADL training;Patient/family education;Balance training;DME and/or AE instruction    OT Goals(Current goals can be found in the care plan section) Acute Rehab OT Goals Patient Stated Goal: home soon OT Goal Formulation: With patient Time For Goal Achievement: 02/28/15 Potential to Achieve Goals: Good  OT Frequency: Min 2X/week              End of Session Nurse Communication:  (place BSC in room for pt to use in prep for endo tomorrow)  Activity Tolerance: Patient tolerated  treatment well Patient left: in chair;with call bell/phone within reach   Time: 1587-2761 OT Time Calculation (min): 23 min Charges:  OT General Charges $OT Visit: 1 Procedure OT Evaluation $Initial OT Evaluation Tier I: 1 Procedure OT Treatments $Self Care/Home Management : 8-22 mins  Almon Register [phone] 02/21/2015, 3:28 PM

## 2015-02-21 NOTE — Progress Notes (Signed)
[facility] Progress Note  Assessment/Plan: 1. Anemia/+FOBT: GI consulted (Dr. Hilarie Fredrickson)  2/p  units of PRBC PPI per primary. Endoscopy scheduled for Monday. 2. ESRD - Hemodialysis MWF at [facility]. Will have HD Monday - coordinate around endo - no heparin HD 3. Hypertension/volume - Net UF 1.5 Saturday; . No BP meds at home. Post HD weight 72.8 4. Anemia - Hgb 5.9 now up to 8.47/17  T Sat 23% (02/17/15) and 14% 7/15 before transfusion. Repleting Fe. On Mircera 225 - last dose 7/6 - redose with ESA 7/20 200 Aranesp if still here 5. Metabolic bone disease - Ca/P controlled PTH 213 (01/20/15). Continue Renvela -  reduced dose while in hospital to 2400 mg PO TID with meals- previous dose needs to be resumed at d/c. Continue sensipar.  6. Nutrition - Albumin 2.6  Renal diet/Renal vit. - add supplements 7. Afib: Coumadin on hold. 8. DM: No home meds. Will follow QID BS per primary with S/S insulin as needed.  Patrick Jacobson, PA-C [place] [phone] 02/21/2015,9:07 AM  LOS: 2 days   Renal Attending: Agree with eval and plans as articulated in the above note.  [name] C    Subjective:   No BM for several days.  Having meds ordered to help. Ate 100% breakfast  Objective Filed Vitals:   02/20/15 0500 02/20/15 1637 02/20/15 2146 02/21/15 0538  BP: 102/71 133/62 134/76 148/68  Pulse: 71 55 51 51  Temp: 98.3 F (36.8 C) 98.3 F (36.8 C) 98.1 F (36.7 C) 98.1 F (36.7 C)  TempSrc: Oral Oral Oral Oral  Resp: 16 16 18 18   Height:      Weight:      SpO2: 100% 100% 100% 100%   Physical Exam General: NAD Heart: RRR Lungs: no rales Abdomen: soft NT Extremities: tr LE edema Dialysis Access: right AVF + bruit  Dialysis Orders: Center: [facility] on MWF . EDW 72 kg HD Bath 2.0 K 2.0 Ca Time 4 hours Heparin NO HEPARIN. Access RFA AVF BFR 450 DFR 800  Mircera 225 mcg IV q 2 weeks, last dose 02/10/15.  Calcitriol 2.25 mcg MWF with  HD  Additional Objective Labs: Basic Metabolic Panel:  Recent Labs Lab 02/19/15 1655 02/20/15 0230 02/21/15 0554  NA 139 135 135  K 3.9 4.3 4.4  CL 98* 97* 97*  CO2 35* 32 31  GLUCOSE 81 67 80  BUN 24* 31* 31*  CREATININE 4.00* 4.90* 5.36*  CALCIUM 7.9* 8.1* 8.6*  PHOS 3.5 4.6  --    Liver Function Tests:  Recent Labs Lab 02/19/15 1655 02/20/15 0230  AST 19  --   ALT 20  --   ALKPHOS 46  --   BILITOT 0.2*  --   PROT 5.2*  --   ALBUMIN 2.6* 2.6*  CBC:  Recent Labs Lab 02/19/15 1655 02/20/15 0229 02/20/15 1235 02/21/15 0554  WBC 4.8 5.3 5.3 5.7  NEUTROABS 2.8  --   --   --   HGB 5.9* 6.8* 7.9* 8.4*  HCT 18.7* 21.7* 24.7* 26.1*  MCV 89.0 88.2 87.6 87.0  PLT 140* 143* 158 163   CBG:  Recent Labs Lab 02/20/15 0844 02/20/15 1236 02/20/15 1635 02/20/15 2144 02/21/15 0753  GLUCAP 75 104* 99 114* 86   Iron Studies:  Recent Labs  02/19/15 1655  IRON 27*  TIBC 189*  FERRITIN 771*  Medications:   . sodium chloride   Intravenous Once  . allopurinol  100 mg Oral QHS  .  cinacalcet  90 mg Oral Q breakfast  . [START ON 02/22/2015] ferric gluconate (FERRLECIT/NULECIT) IV  125 mg Intravenous Q M,W,F-HD  . insulin aspart  0-9 Units Subcutaneous TID WC  . levETIRAcetam  250 mg Oral QHS  . multivitamin  1 tablet Oral QHS  . pantoprazole (PROTONIX) IV  40 mg Intravenous Q12H  . polyethylene glycol  17 g Oral Daily  . senna  1 tablet Oral Daily  . sevelamer carbonate  2,400 mg Oral TID WC  . sodium chloride  3 mL Intravenous Q12H  . sodium chloride  3 mL Intravenous Q12H  . tamsulosin  0.4 mg Oral QPC breakfast

## 2015-02-22 ENCOUNTER — Inpatient Hospital Stay (HOSPITAL_COMMUNITY): Payer: Medicare Other | Admitting: Certified Registered"

## 2015-02-22 ENCOUNTER — Encounter (HOSPITAL_COMMUNITY): Payer: Self-pay | Admitting: Anesthesiology

## 2015-02-22 ENCOUNTER — Encounter (HOSPITAL_COMMUNITY)
Admission: AD | Disposition: A | Discharge status: Home / Self Care | Payer: Self-pay | Source: Ambulatory Visit | Attending: Internal Medicine

## 2015-02-22 DIAGNOSIS — D124 Benign neoplasm of descending colon: Secondary | ICD-10-CM

## 2015-02-22 DIAGNOSIS — D125 Benign neoplasm of sigmoid colon: Secondary | ICD-10-CM

## 2015-02-22 HISTORY — PX: ESOPHAGOGASTRODUODENOSCOPY: SHX5428

## 2015-02-22 HISTORY — PX: COLONOSCOPY: SHX5424

## 2015-02-22 LAB — CBC
HEMATOCRIT: 24.3 % — AB (ref 39.0–52.0)
Hemoglobin: 8 g/dL — ABNORMAL LOW (ref 13.0–17.0)
MCH: 27.9 pg (ref 26.0–34.0)
MCHC: 32.9 g/dL (ref 30.0–36.0)
MCV: 84.7 fL (ref 78.0–100.0)
Platelets: 145 10*3/uL — ABNORMAL LOW (ref 150–400)
RBC: 2.87 MIL/uL — ABNORMAL LOW (ref 4.22–5.81)
RDW: 16.5 % — AB (ref 11.5–15.5)
WBC: 6.3 10*3/uL (ref 4.0–10.5)

## 2015-02-22 LAB — RENAL FUNCTION PANEL
Albumin: 2.6 g/dL — ABNORMAL LOW (ref 3.5–5.0)
Anion gap: 13 (ref 5–15)
BUN: 54 mg/dL — ABNORMAL HIGH (ref 6–20)
CO2: 25 mmol/L (ref 22–32)
Calcium: 8 mg/dL — ABNORMAL LOW (ref 8.9–10.3)
Chloride: 87 mmol/L — ABNORMAL LOW (ref 101–111)
Creatinine, Ser: 7.02 mg/dL — ABNORMAL HIGH (ref 0.61–1.24)
GFR calc Af Amer: 8 mL/min — ABNORMAL LOW (ref >60)
GFR calc non Af Amer: 7 mL/min — ABNORMAL LOW (ref >60)
Glucose, Bld: 83 mg/dL (ref 65–99)
Phosphorus: 5.4 mg/dL — ABNORMAL HIGH (ref 2.5–4.6)
Potassium: 4.7 mmol/L (ref 3.5–5.1)
Sodium: 125 mmol/L — ABNORMAL LOW (ref 135–145)

## 2015-02-22 LAB — GLUCOSE, CAPILLARY: Glucose-Capillary: 91 mg/dL (ref 65–99)

## 2015-02-22 SURGERY — COLONOSCOPY
Anesthesia: Monitor Anesthesia Care

## 2015-02-22 MED ORDER — LIDOCAINE HCL (PF) 1 % IJ SOLN: 5.0000 mL | INTRAMUSCULAR | Status: DC | PRN

## 2015-02-22 MED ORDER — CINACALCET HCL 30 MG PO TABS
90.0000 mg | ORAL_TABLET | Freq: Every day | ORAL | Status: DC
  Filled 2015-02-22: qty 3

## 2015-02-22 MED ORDER — SODIUM CHLORIDE 0.9 % IV SOLN
INTRAVENOUS | Status: DC | PRN
  Administered 2015-02-22: 13:00:00 via INTRAVENOUS

## 2015-02-22 MED ORDER — SEVELAMER CARBONATE 800 MG PO TABS: 3200.0000 mg | ORAL_TABLET | ORAL | Status: DC | PRN

## 2015-02-22 MED ORDER — SODIUM CHLORIDE 0.9 % IV SOLN: INTRAVENOUS | Status: DC

## 2015-02-22 MED ORDER — NEPRO/CARBSTEADY PO LIQD: 237.0000 mL | ORAL | Status: DC | PRN

## 2015-02-22 MED ORDER — SODIUM CHLORIDE 0.9 % IV SOLN: 100.0000 mL | INTRAVENOUS | Status: DC | PRN

## 2015-02-22 MED ORDER — LIDOCAINE-PRILOCAINE 2.5-2.5 % EX CREA: 1.0000 "application " | TOPICAL_CREAM | CUTANEOUS | Status: DC | PRN

## 2015-02-22 MED ORDER — PROPOFOL 10 MG/ML IV BOLUS
INTRAVENOUS | Status: DC | PRN
  Administered 2015-02-22 (×3): 10 mg via INTRAVENOUS

## 2015-02-22 MED ORDER — FLEET ENEMA 7-19 GM/118ML RE ENEM
1.0000 | ENEMA | Freq: Once | RECTAL | Status: AC
  Administered 2015-02-22: 1 via RECTAL
  Filled 2015-02-22: qty 1

## 2015-02-22 MED ORDER — HEPARIN SODIUM (PORCINE) 1000 UNIT/ML DIALYSIS: 1000.0000 [IU] | INTRAMUSCULAR | Status: DC | PRN

## 2015-02-22 MED ORDER — PENTAFLUOROPROP-TETRAFLUOROETH EX AERO: 1.0000 "application " | INHALATION_SPRAY | CUTANEOUS | Status: DC | PRN

## 2015-02-22 MED ORDER — PROPOFOL INFUSION 10 MG/ML OPTIME
INTRAVENOUS | Status: DC | PRN
  Administered 2015-02-22: 50 ug/kg/min via INTRAVENOUS

## 2015-02-22 MED ORDER — PANTOPRAZOLE SODIUM 40 MG PO TBEC
40.0000 mg | DELAYED_RELEASE_TABLET | Freq: Every day | ORAL | Status: DC
  Administered 2015-02-23: 40 mg via ORAL

## 2015-02-22 MED ORDER — ALTEPLASE 2 MG IJ SOLR
2.0000 mg | Freq: Once | INTRAMUSCULAR | Status: DC | PRN
  Filled 2015-02-22: qty 2

## 2015-02-22 MED ORDER — SEVELAMER CARBONATE 800 MG PO TABS
4000.0000 mg | ORAL_TABLET | Freq: Three times a day (TID) | ORAL | Status: DC
  Administered 2015-02-23 (×2): 4000 mg via ORAL
  Filled 2015-02-22 (×4): qty 5

## 2015-02-22 MED ORDER — LIDOCAINE-PRILOCAINE 2.5-2.5 % EX CREA
1.0000 "application " | TOPICAL_CREAM | CUTANEOUS | Status: DC | PRN
  Filled 2015-02-22: qty 5

## 2015-02-22 MED ORDER — CINACALCET HCL 30 MG PO TABS: 90.0000 mg | ORAL_TABLET | Freq: Every day | ORAL | Status: DC

## 2015-02-22 NOTE — Progress Notes (Signed)
[facility] Progress Note  Assessment/Plan: 1. Anemia/+FOBT: EGD/CSY today, no obvious source, for capsule study 2. ESRD -  1. Hemodialysis MWF at [facility]. 2. Will have HD Today 3. No heparin HD 3. Hypertension/volume -  1. No BP meds at home.  2. EDW about right for now 4. Anemia -  1. Admit Hgb 5.9 rec 2u PRBC 2. Repleting Fe.  3. On Mircera 225 - last dose 7/6 - redose with ESA 7/20 200 Aranesp if still here 5. Metabolic bone disease - Ca/P controlled PTH 213 (01/20/15). Continue Renvela -  reduced dose while in hospital to 2400 mg PO TID with meals- previous dose needs to be resumed at d/c. Continue sensipar.  6. Nutrition - Albumin 2.6  Renal diet/Renal vit. - add supplements 7. Afib: Coumadin on hold. 8. DM: No home meds. Will follow QID BS per primary with S/S insulin as needed. 9. Aortic Stenosis:   Minnie Legros B    Subjective:   EGD?CSY today w/o cause of bleeding  Objective Filed Vitals:   02/22/15 1445 02/22/15 1450 02/22/15 1455 02/22/15 1500  BP:  135/69  143/69  Pulse: 53 49 47 46  Temp:      TempSrc:      Resp: 13 15 14 11   Height:      Weight:      SpO2: 100% 100% 100% 100%   Physical Exam General: NAD Heart: RRR Lungs: no rales Abdomen: soft NT Extremities: tr LE edema Dialysis Access: right AVF + bruit  Dialysis Orders: Center: [place] on MWF . EDW 72 kg HD Bath 2.0 K 2.0 Ca Time 4 hours Heparin NO HEPARIN. Access RFA AVF BFR 450 DFR 800  Mircera 225 mcg IV q 2 weeks, last dose 02/10/15.  Calcitriol 2.25 mcg MWF with HD  Additional Objective Labs: Basic Metabolic Panel:  Recent Labs Lab 02/19/15 1655 02/20/15 0230 02/21/15 0554  NA 139 135 135  K 3.9 4.3 4.4  CL 98* 97* 97*  CO2 35* 32 31  GLUCOSE 81 67 80  BUN 24* 31* 31*  CREATININE 4.00* 4.90* 5.36*  CALCIUM 7.9* 8.1* 8.6*  PHOS 3.5 4.6  --    Liver Function Tests:  Recent Labs Lab 02/19/15 1655 02/20/15 0230  AST 19  --   ALT 20  --    ALKPHOS 46  --   BILITOT 0.2*  --   PROT 5.2*  --   ALBUMIN 2.6* 2.6*  CBC:  Recent Labs Lab 02/19/15 1655 02/20/15 0229 02/20/15 1235 02/21/15 0554  WBC 4.8 5.3 5.3 5.7  NEUTROABS 2.8  --   --   --   HGB 5.9* 6.8* 7.9* 8.4*  HCT 18.7* 21.7* 24.7* 26.1*  MCV 89.0 88.2 87.6 87.0  PLT 140* 143* 158 163   CBG:  Recent Labs Lab 02/21/15 0753 02/21/15 1221 02/21/15 1727 02/21/15 2139 02/22/15 0813  GLUCAP 86 90 129* 121* 91   Iron Studies:   Recent Labs  02/19/15 1655  IRON 27*  TIBC 189*  FERRITIN 771*  Medications:   . sodium chloride   Intravenous Once  . allopurinol  100 mg Oral QHS  . bisacodyl  10 mg Rectal BID  . [START ON 02/23/2015] cinacalcet  90 mg Oral QPC supper  . [START ON 02/24/2015] darbepoetin (ARANESP) injection - DIALYSIS  200 mcg Intravenous Q Wed-HD  . feeding supplement (NEPRO CARB STEADY)  237 mL Oral BID BM  . ferric gluconate (FERRLECIT/NULECIT) IV  125 mg Intravenous Q  M,W,F-HD  . insulin aspart  0-9 Units Subcutaneous TID WC  . levETIRAcetam  250 mg Oral QHS  . multivitamin  1 tablet Oral QHS  . [START ON 02/23/2015] pantoprazole  40 mg Oral Daily  . polyethylene glycol  17 g Oral Daily  . senna  1 tablet Oral Daily  . sevelamer carbonate  4,000 mg Oral TID WC  . sodium chloride  3 mL Intravenous Q12H  . sodium chloride  3 mL Intravenous Q12H  . tamsulosin  0.4 mg Oral QPC breakfast

## 2015-02-22 NOTE — Care Management Important Message (Signed)
Important Message  Patient Details  Name: [name] MRN: [id number] Date of Birth: 06-Mar-1941   Medicare Important Message Given:  Yes-second notification given    Delorse Lek 02/22/2015, 1:56 PM

## 2015-02-22 NOTE — Transfer of Care (Signed)
Immediate Anesthesia Transfer of Care Note  Patient: [name]  Procedure(s) Performed: Procedure(s): COLONOSCOPY (N/A) ESOPHAGOGASTRODUODENOSCOPY (EGD) (N/A)  Patient Location: PACU and Endoscopy Unit  Anesthesia Type:MAC  Level of Consciousness: awake, alert , oriented and sedated  Airway & Oxygen Therapy: Patient Spontanous Breathing and Patient connected to nasal cannula oxygen  Post-op Assessment: Report given to RN, Post -op Vital signs reviewed and stable and Patient moving all extremities  Post vital signs: Reviewed and stable  Last Vitals:  Filed Vitals:   02/22/15 1226  BP: 174/69  Pulse: 55  Temp: 36.4 C  Resp: 13    Complications: No apparent anesthesia complications

## 2015-02-22 NOTE — Anesthesia Preprocedure Evaluation (Addendum)
Anesthesia Evaluation  Patient identified by MRN, date of birth, ID band Patient awake    Reviewed: Allergy & Precautions, NPO status , Patient's Chart, lab work & pertinent test results  History of Anesthesia Complications Negative for: history of anesthetic complications  Airway Mallampati: II  TM Distance: >3 FB Neck ROM: Full    Dental  (+) Dental Advisory Given, Poor Dentition   Pulmonary sleep apnea ,    Pulmonary exam normal       Cardiovascular hypertension, + Peripheral Vascular Disease + dysrhythmias Atrial Fibrillation + Valvular Problems/Murmurs AS Rhythm:Regular Rate:Normal + Systolic murmurs    Neuro/Psych Seizures - (no seizures in approx 2 years (since starting Keppra)), Well Controlled,  CVA negative psych ROS   GI/Hepatic negative GI ROS, Neg liver ROS,   Endo/Other  diabetes, Type 2, Oral Hypoglycemic Agents  Renal/GU Dialysis and ESRFRenal disease (last HD on Friday; none yet today.  functioning right forearm AVF)     Musculoskeletal   Abdominal   Peds  Hematology   Anesthesia Other Findings   Reproductive/Obstetrics                            Anesthesia Physical Anesthesia Plan  ASA: III  Anesthesia Plan: MAC   Post-op Pain Management:    Induction: Intravenous  Airway Management Planned: Simple Face Mask  Additional Equipment:   Intra-op Plan:   Post-operative Plan:   Informed Consent: I have reviewed the patients History and Physical, chart, labs and discussed the procedure including the risks, benefits and alternatives for the proposed anesthesia with the patient or authorized representative who has indicated his/her understanding and acceptance.   Dental advisory given  Plan Discussed with: CRNA, Anesthesiologist and Surgeon  Anesthesia Plan Comments:         Anesthesia Quick Evaluation

## 2015-02-22 NOTE — Op Note (Signed)
[facility] [place], [address]   ENDOSCOPY PROCEDURE REPORT  PATIENT: [name], Patrick  MR#: [id number] BIRTHDATE: 1941-02-06 , 73  yrs. old GENDER: male ENDOSCOPIST: Lafayette Dragon, MD REFERRED BY:  Dr Mart Piggs PROCEDURE DATE:  02/22/2015 PROCEDURE:  EGD, diagnostic ASA CLASS:     Class III INDICATIONS:  positive stool, patient with severe aortic stenosis and drop in hemoglobin from 9.2-7.0.  Coumadin discontinued, INR now <2.0. MEDICATIONS: Monitored anesthesia care TOPICAL ANESTHETIC: none  DESCRIPTION OF PROCEDURE: After the risks benefits and alternatives of the procedure were thoroughly explained, informed consent was obtained.  The    endoscope was introduced through the mouth and advanced to the second portion of the duodenum , Without limitations.  The instrument was slowly withdrawn as the mucosa was fully examined.      ESOPHAGUS: The mucosa of the esophagus appeared normal.  Stomach:  Large amount of retained food in the stomach liquid and solid form. Obscured visualization of the stomach.  Stomach appears to be dilated.  We had to use pediatric colonoscope to advance into the pylorus.  Gastric outlet is normal.  Retroflexion of the endoscope revealed normal fundus and cardia.  No obvious source of bleeding in this limited exam.  DUODENUM: The duodenal mucosa showed no abnormalities in the 4th part of the duodenum, 3rd part duodenum, and bulb and 2nd part duodenum. Colonoscopy reached to  ligament of Treitz          The scope was then withdrawn from the patient and the procedure completed.  COMPLICATIONS: There were no immediate complications.  ENDOSCOPIC IMPRESSION: 1. marked gastric retention. Rule out gastroparesis. Normal gastric outlet 2. Limited exam due to mucosa being obscured by food 3. No obvious source of bleeding 4. Exam was carried out 240 cm in the proximal jejunum, no  AVMs noted  RECOMMENDATIONS: resume diet Iron supplements Consider small bowel capsule endoscopy to look for AVMs in the setting  of aortic stenosis gastro  REPEAT EGD : no  eSigned:  Lafayette Dragon, MD 02/22/2015 2:38 PM    CC:  PATIENT NAME:  Patrick, [name] MR#: [id number]

## 2015-02-22 NOTE — Progress Notes (Signed)
Patient ID: [name], male   DOB: 01-Jun-1941, 74 y.o.   MRN: [id number]  [facility] PROGRESS NOTE  [name] BWI:[id number] DOB: [birth year] DOA: 02/19/2015 PCP: Antony Blackbird, MD   Brief narrative:    74 y.o. male with ESRD on HD MWF, aortic stenosis, a-fib on AC with Coumadin, DM diet controlled, who presented from HD center after noted to have Hg 6.8.  Assessment/Plan:    Principal Problem:   Acute on chronic blood loss anemia - Iron level and TIBC both low on anemia panel - GI team has been consulted, appreciate assistance, will continue to follow up on recommendations  - pt hs been transfused 2 U PRBC, Hg up from 6.8 --> 7.9 --> 8.4 - endoscopy scheduled for today  Active Problems:   End stage renal disease, MWF HD - nephrology team following, next HD today    Atrial fibrillation, rate controlled with CHDS2 at least 4-5 - on Coumadin at home - [facility] still on hold for now until we get results of endoscopy     Diabetes mellitus, diet controlled - reasonable inpatient control - A1C 5.3 - continue SSI     Bradycardia - asymptomatic - d/c telemetry     Thrombocytopenia - unclear etiology - now WNL - repeat CBC in AM    Cerebral thrombosis with cerebral infarction    Seizures - no seizures wile inpatient  - continue Keppra    Metabolic bone disease - continue renvela 2400 mg PO TID - continue sensipar  DVT prophylaxis - SCD's  Code Status: Full.  Family Communication:  plan of care discussed with the patient Disposition Plan: Home when cleared by GI  IV access:  Peripheral IV  Procedures and diagnostic studies:    None   Medical Consultants:  GI Nephrology   Other Consultants:  None  IAnti-Infectives:   None  Faye Ramsay, MD  [facility] Pager [phone]  If 7PM-7AM, please contact night-coverage [email or web address] Password [facility] 02/22/2015, 11:47 AM   LOS: 3 days   HPI/Subjective: No events overnight.   Objective: Filed  Vitals:   02/21/15 2143 02/22/15 0414 02/22/15 0539 02/22/15 0913  BP: 149/68  166/68 125/65  Pulse: 52  46 53  Temp: 97.9 F (36.6 C)  97.5 F (36.4 C) 97.6 F (36.4 C)  TempSrc: Oral  Oral Oral  Resp: 16  16 17   Height:      Weight: 70 kg (154 lb 5.2 oz) 70 kg (154 lb 5.2 oz)    SpO2: 100%  98% 99%    Intake/Output Summary (Last 24 hours) at 02/22/15 1147 Last data filed at 02/22/15 1129  Gross per 24 hour  Intake   4480 ml  Output    102 ml  Net   4378 ml    Exam:   General:  Pt is alert, follows commands appropriately, not in acute distress  Cardiovascular: Regular rhythm, bradycardic, SEM 2/6, no rubs, no gallops  Respiratory: Clear to auscultation bilaterally, no wheezing, diminished breath sounds at bases   Abdomen: Soft, non tender, non distended, bowel sounds present, no guarding  Extremities: No edema, pulses DP and PT palpable bilaterally  Data Reviewed: Basic Metabolic Panel:  Recent Labs Lab 02/19/15 1655 02/20/15 0230 02/21/15 0554  NA 139 135 135  K 3.9 4.3 4.4  CL 98* 97* 97*  CO2 35* 32 31  GLUCOSE 81 67 80  BUN 24* 31* 31*  CREATININE 4.00* 4.90* 5.36*  CALCIUM 7.9* 8.1* 8.6*  MG 2.1  --   --  PHOS 3.5 4.6  --    Liver Function Tests:  Recent Labs Lab 02/19/15 1655 02/20/15 0230  AST 19  --   ALT 20  --   ALKPHOS 46  --   BILITOT 0.2*  --   PROT 5.2*  --   ALBUMIN 2.6* 2.6*   CBC:  Recent Labs Lab 02/19/15 1655 02/20/15 0229 02/20/15 1235 02/21/15 0554  WBC 4.8 5.3 5.3 5.7  NEUTROABS 2.8  --   --   --   HGB 5.9* 6.8* 7.9* 8.4*  HCT 18.7* 21.7* 24.7* 26.1*  MCV 89.0 88.2 87.6 87.0  PLT 140* 143* 158 163   CBG:  Recent Labs Lab 02/21/15 0753 02/21/15 1221 02/21/15 1727 02/21/15 2139 02/22/15 0813  GLUCAP 86 90 129* 121* 91    Recent Results (from the past 240 hour(s))  MRSA PCR Screening     Status: None   Collection Time: 02/20/15  6:42 AM  Result Value Ref Range Status   MRSA by PCR NEGATIVE NEGATIVE  Final    Comment:        The GeneXpert MRSA Assay (FDA approved for NASAL specimens only), is one component of a comprehensive MRSA colonization surveillance program. It is not intended to diagnose MRSA infection nor to guide or monitor treatment for MRSA infections.      Scheduled Meds: . sodium chloride   Intravenous Once  . allopurinol  100 mg Oral QHS  . cinacalcet  90 mg Oral Q breakfast  . insulin aspart  0-9 Units Subcutaneous TID WC  . levETIRAcetam  250 mg Oral QHS  . multivitamin  1 tablet Oral QHS  . pantoprazole (PROTONIX) IV  40 mg Intravenous Q12H  . polyethylene glycol  17 g Oral Daily  . senna  1 tablet Oral Daily  . sevelamer carbonate  2,400 mg Oral TID WC  . tamsulosin  0.4 mg Oral QPC breakfast   Continuous Infusions:

## 2015-02-22 NOTE — Progress Notes (Signed)
Patient wasn't able to finish all of bowel prep for colonoscopy this morning. He stated that he couldn't consume anymore. Per nurse tech, patient's last stool was dark brown color with no particles. GI MD was notified. MD states that he will let rounding MD decide if procedure would be performed. RN will continue to monitor patient.  Ermalinda Memos, RN

## 2015-02-22 NOTE — Progress Notes (Signed)
Initial Nutrition Assessment  DOCUMENTATION CODES:   Not applicable  INTERVENTION:   Once diet advances, continue Nepro Shake po BID, each supplement provides 425 kcal and 19 grams protein.  NUTRITION DIAGNOSIS:   Increased nutrient needs related to chronic illness as evidenced by estimated needs.  GOAL:   Patient will meet greater than or equal to 90% of their needs  MONITOR:   Diet advancement, Weight trends, Labs, I & O's  REASON FOR ASSESSMENT:   Consult Assessment of nutrition requirement/status  ASSESSMENT:   74 y.o. male with ESRD on HD MWF, aortic stenosis, a-fib on AC with Coumadin, DM diet controlled, who presented from HD center after noted to have Hg 6.8.  Pt is currently NPO for colonoscopy today. Unable to obtain nutrition hx or perform nutrition focused physical exam as pt was in procedure during time of visit. Meal completion has been 100%. Pt additionally has Nepro ordered and has been consuming them. RD to continue with current orders. RD to revisit.   Labs: Low chloride, calcium, and GFR. High BUN and creatinine.   Diet Order:  Diet NPO time specified  Skin:   (Non-pitting LE edema)  Last BM:  7/18  Height:   Ht Readings from Last 1 Encounters:  02/22/15 5\' 10"  (1.778 m)    Weight:   Wt Readings from Last 1 Encounters:  02/22/15 170 lb (77.111 kg)    Ideal Body Weight:  75.45 kg  Wt Readings from Last 10 Encounters:  02/22/15 170 lb (77.111 kg)  02/11/15 161 lb (73.029 kg)  06/18/14 161 lb 6.4 oz (73.211 kg)  05/21/14 156 lb (70.761 kg)  07/23/12 176 lb (79.833 kg)  04/24/12 161 lb (73.029 kg)  01/19/12 161 lb (73.029 kg)  07/28/11 189 lb 6 oz (85.9 kg)  06/12/11 167 lb (75.751 kg)  04/07/11 161 lb (73.029 kg)    BMI:  Body mass index is 24.39 kg/(m^2).  Estimated Nutritional Needs:   Kcal:  2000-2200  Protein:  100-115 grams  Fluid:  Per MD  EDUCATION NEEDS:   No education needs identified at this time  Corrin Parker, MS, RD, LDN Pager # [phone] After hours/ weekend pager # [phone]

## 2015-02-22 NOTE — Anesthesia Procedure Notes (Signed)
Procedure Name: MAC Date/Time: 02/22/2015 1:30 PM Performed by: Suzy Bouchard Pre-anesthesia Checklist: Patient identified, Emergency Drugs available, Suction available, Patient being monitored and Timeout performed Patient Re-evaluated:Patient Re-evaluated prior to inductionOxygen Delivery Method: Simple face mask and Nasal cannula Preoxygenation: Pre-oxygenation with 100% oxygen

## 2015-02-22 NOTE — Progress Notes (Signed)
Pt took most of his prep for EGD/colon, will give Fleet's enema before 1.00 pm procedure

## 2015-02-22 NOTE — Op Note (Signed)
[facility] [place], [address]   COLONOSCOPY PROCEDURE REPORT  PATIENT: [name], [name]  MR#: [id number] BIRTHDATE: [birth year] , 73  yrs. old GENDER: male ENDOSCOPIST: Lafayette Dragon, MD REFERRED BY:Dr Mart Piggs PROCEDURE DATE:  02/22/2015 PROCEDURE:   Colonoscopy, diagnostic First Screening Colonoscopy - Avg.  risk and is 50 yrs.  old or older - No.  Prior Negative Screening - Now for repeat screening. N/A  History of Adenoma - Now for follow-up colonoscopy & has been > or = to 3 yrs.  N/A  Polyps removed today? Yes ASA CLASS:   Class III INDICATIONS:Unexplained iron deficiency anemia and Colorectal Neoplasm Risk Assessment for this procedure is average risk. MEDICATIONS: Monitored anesthesia care  DESCRIPTION OF PROCEDURE:   After the risks benefits and alternatives of the procedure were thoroughly explained, informed consent was obtained.  The digital rectal exam revealed no abnormalities of the rectum.   The Pentax Ped Colon [id number] endoscope was introduced through the anus and advanced to the cecum, which was identified by both the appendix and ileocecal valve. No adverse events experienced.   The quality of the prep was fair.  The instrument was then slowly withdrawn as the colon was fully examined. Estimated blood loss is zero unless otherwise noted in this procedure report.      COLON FINDINGS: A firm semi-pedunculated polyp measuring 10 mm in size was found in the sigmoid colon.  A polypectomy was performed with a cold snare.  The resection was complete but the polyp tissue was not retrieved.  Retroflexed views revealed no abnormalities. The time to cecum = 12.0 Withdrawal time = 9.0   The scope was withdrawn and the procedure completed. COMPLICATIONS: There were no immediate complications.  ENDOSCOPIC IMPRESSION: Semi-pedunculated polyp was found in the sigmoid colon; polypectomy was performed with a cold  snare suboptimal prep precluding adequate visualization or identification of AVMs area no AVMs seen No significant diverticulosis Nothing to account for heme positive stool  RECOMMENDATIOS  resume diet Consider small bowel capsule endoscopy to look for AVMs in the setting of aortic stenosis Iron supplements Await pathology results from biopsies from the post-polypectomy site Hold Coumadin for -14 days recall colonoscopy pending path report  eSigned:  Lafayette Dragon, MD 02/22/2015 2:46 PM   cc:   PATIENT NAME:  [name], [name] MR#: [id number]

## 2015-02-23 DIAGNOSIS — D124 Benign neoplasm of descending colon: Secondary | ICD-10-CM

## 2015-02-23 LAB — BASIC METABOLIC PANEL
Anion gap: 8 (ref 5–15)
BUN: 23 mg/dL — ABNORMAL HIGH (ref 6–20)
CHLORIDE: 93 mmol/L — AB (ref 101–111)
CO2: 29 mmol/L (ref 22–32)
Calcium: 7.9 mg/dL — ABNORMAL LOW (ref 8.9–10.3)
Creatinine, Ser: 4.62 mg/dL — ABNORMAL HIGH (ref 0.61–1.24)
GFR calc non Af Amer: 11 mL/min — ABNORMAL LOW (ref >60)
GFR, EST AFRICAN AMERICAN: 13 mL/min — AB (ref >60)
GLUCOSE: 107 mg/dL — AB (ref 65–99)
POTASSIUM: 4 mmol/L (ref 3.5–5.1)
Sodium: 130 mmol/L — ABNORMAL LOW (ref 135–145)

## 2015-02-23 LAB — CBC
HEMATOCRIT: 25.4 % — AB (ref 39.0–52.0)
Hemoglobin: 8.3 g/dL — ABNORMAL LOW (ref 13.0–17.0)
MCH: 27.9 pg (ref 26.0–34.0)
MCHC: 32.7 g/dL (ref 30.0–36.0)
MCV: 85.5 fL (ref 78.0–100.0)
PLATELETS: 134 10*3/uL — AB (ref 150–400)
RBC: 2.97 MIL/uL — ABNORMAL LOW (ref 4.22–5.81)
RDW: 16.6 % — AB (ref 11.5–15.5)
WBC: 4.8 10*3/uL (ref 4.0–10.5)

## 2015-02-23 LAB — GLUCOSE, CAPILLARY
Glucose-Capillary: 86 mg/dL (ref 65–99)
Glucose-Capillary: 64 mg/dL — ABNORMAL LOW (ref 65–99)

## 2015-02-23 MED ORDER — ZOLPIDEM TARTRATE 5 MG PO TABS: 5.0000 mg | ORAL_TABLET | Freq: Every evening | ORAL | Status: AC | PRN

## 2015-02-23 MED ORDER — CALCIUM CARBONATE 1250 MG/5ML PO SUSP: 500.0000 mg | Freq: Four times a day (QID) | ORAL | Status: AC | PRN

## 2015-02-23 MED ORDER — DARBEPOETIN ALFA 200 MCG/0.4ML IJ SOSY: 200.0000 ug | PREFILLED_SYRINGE | INTRAMUSCULAR | Status: DC

## 2015-02-23 MED ORDER — BISACODYL 10 MG RE SUPP: 10.0000 mg | Freq: Two times a day (BID) | RECTAL | Status: AC

## 2015-02-23 MED ORDER — SEVELAMER CARBONATE 800 MG PO TABS: 4000.0000 mg | ORAL_TABLET | ORAL | Status: AC

## 2015-02-23 NOTE — Discharge Summary (Signed)
Physician Discharge Summary  [name] LZJ:[id number] DOB: [birth year] DOA: 02/19/2015  PCP: Antony Blackbird, MD  Admit date: 02/19/2015 Discharge date: 02/23/2015  Recommendations for Outpatient Follow-up:  1. Pt will need to follow up with PCP in 2-3 weeks post discharge 2. Please also check CBC to evaluate Hg and Hct levels 3. Pt advised to stop taking Coumadin for 2 weeks   Discharge Diagnoses:  Principal Problem:   Anemia Active Problems:   End stage renal disease   Atrial fibrillation  Discharge Condition: Stable  Diet recommendation: Renal diet   History of present illness:   74 y.o. male with ESRD on HD MWF, aortic stenosis, a-fib on AC with Coumadin, DM diet controlled, who presented from HD center after noted to have Hg 6.8.  Assessment/Plan:    Principal Problem:  Acute on chronic blood loss anemia - Iron level and TIBC both low on anemia panel - GI team has been consulted, appreciate assistance, will continue to follow up on recommendations  - pt hs been transfused 2 U PRBC, Hg up from 6.8 --> 7.9 --> 8.4 - endoscopy did not reveal source of bleeding   Active Problems:  End stage renal disease, MWF HD - nephrology team following   Atrial fibrillation, rate controlled with CHDS2 at least 4-5 - on Coumadin at home - pt advised to continue coumadin in 2 weeks    Diabetes mellitus, diet controlled - reasonable inpatient control - A1C 5.3   Bradycardia - asymptomatic   Thrombocytopenia - unclear etiology   Cerebral thrombosis with cerebral infarction   Seizures - no seizures wile inpatient  - continue Keppra   Metabolic bone disease - continue renvela 2400 mg PO TID - continue sensipar  Code Status: Full.  Family Communication: plan of care discussed with the patient Disposition Plan: Home   IV access:  Peripheral IV  Procedures and diagnostic studies:   None   Medical Consultants:  GI Nephrology   Other  Consultants:  None  IAnti-Infectives:   None      Discharge Exam: Filed Vitals:   02/23/15 0620  BP: 124/70  Pulse: 55  Temp: 98.7 F (37.1 C)  Resp: 17   Filed Vitals:   02/22/15 2315 02/22/15 2358 02/23/15 0412 02/23/15 0620  BP: 115/50 116/57  124/70  Pulse: 53 54  55  Temp:  98.5 F (36.9 C)  98.7 F (37.1 C)  TempSrc:      Resp: 19 18  17   Height:      Weight:   75.9 kg (167 lb 5.3 oz)   SpO2:  100%  100%    General: Pt is alert, follows commands appropriately, not in acute distress Cardiovascular: Regular rate and rhythm, S1/S2 +, no murmurs, no rubs, no gallops Respiratory: Clear to auscultation bilaterally, no wheezing, no crackles, no rhonchi Abdominal: Soft, non tender, non distended, bowel sounds +, no guarding Extremities: no edema, no cyanosis, pulses palpable bilaterally DP and PT Neuro: Grossly nonfocal  Discharge Instructions  Discharge Instructions    Diet - low sodium heart healthy    Complete by:  As directed      Increase activity slowly    Complete by:  As directed             Medication List    STOP taking these medications        warfarin 2.5 MG tablet  Commonly known as:  COUMADIN     warfarin 5 MG tablet  Commonly known as:  COUMADIN      TAKE these medications        allopurinol 100 MG tablet  Commonly known as:  ZYLOPRIM  Take 100 mg by mouth daily after supper.     bisacodyl 10 MG suppository  Commonly known as:  DULCOLAX  Place 1 suppository (10 mg total) rectally 2 (two) times daily.     calcium carbonate (dosed in mg elemental calcium) 1250 (500 CA) MG/5ML  Take 5 mLs (500 mg of elemental calcium total) by mouth every 6 (six) hours as needed for indigestion.     cinacalcet 90 MG tablet  Commonly known as:  SENSIPAR  Take 90 mg by mouth daily after supper.     folic acid-vitamin b complex-vitamin c-selenium-zinc 3 MG Tabs tablet  Take 1 tablet by mouth daily after supper.     levETIRAcetam 500 MG tablet   Commonly known as:  KEPPRA  Take 250 mg by mouth daily after supper.     nitroGLYCERIN 0.4 MG SL tablet  Commonly known as:  NITROSTAT  Place 0.4 mg under the tongue every 5 (five) minutes as needed for chest pain.     pantoprazole 40 MG tablet  Commonly known as:  PROTONIX     polyethylene glycol packet  Commonly known as:  MIRALAX / GLYCOLAX  Take 17 g by mouth daily as needed (constipation). For constipation     senna 8.6 MG Tabs tablet  Commonly known as:  SENOKOT  Take 1 tablet by mouth daily as needed (constipation).     sevelamer carbonate 800 MG tablet  Commonly known as:  RENVELA  Take 5 tablets (4,000 mg total) by mouth See admin instructions. Take 5 tablets (4000 mg) with meals and 4 tablets (3200 mg) with snacks     tamsulosin 0.4 MG Caps capsule  Commonly known as:  FLOMAX  Take 0.4 mg by mouth daily after breakfast.     zolpidem 5 MG tablet  Commonly known as:  AMBIEN  Take 1 tablet (5 mg total) by mouth at bedtime as needed for sleep (Insomnia).            Follow-up Information    Follow up with FULP, CAMMIE, MD.   Specialty:  Family Medicine   Contact information:   [phone] [place] [address] [phone]       Call Faye Ramsay, MD.   Specialty:  Internal Medicine   Why:  As needed call my cell phone [phone]   Contact information:   [address] [place] [address] [phone]        The results of significant diagnostics from this hospitalization (including imaging, microbiology, ancillary and laboratory) are listed below for reference.     Microbiology: Recent Results (from the past 240 hour(s))  MRSA PCR Screening     Status: None   Collection Time: 02/20/15  6:42 AM  Result Value Ref Range Status   MRSA by PCR NEGATIVE NEGATIVE Final    Comment:        The GeneXpert MRSA Assay (FDA approved for NASAL specimens only), is one component of a comprehensive MRSA  colonization surveillance program. It is not intended to diagnose MRSA infection nor to guide or monitor treatment for MRSA infections.      Labs: Basic Metabolic Panel:  Recent Labs Lab 02/19/15 1655 02/20/15 0230 02/21/15 0554 02/22/15 2227 02/23/15 0910  NA 139 135 135 125* 130*  K 3.9 4.3 4.4 4.7 4.0  CL  98* 97* 97* 87* 93*  CO2 35* 32 31 25 29   GLUCOSE 81 67 80 83 107*  BUN 24* 31* 31* 54* 23*  CREATININE 4.00* 4.90* 5.36* 7.02* 4.62*  CALCIUM 7.9* 8.1* 8.6* 8.0* 7.9*  MG 2.1  --   --   --   --   PHOS 3.5 4.6  --  5.4*  --    Liver Function Tests:  Recent Labs Lab 02/19/15 1655 02/20/15 0230 02/22/15 2227  AST 19  --   --   ALT 20  --   --   ALKPHOS 46  --   --   BILITOT 0.2*  --   --   PROT 5.2*  --   --   ALBUMIN 2.6* 2.6* 2.6*   CBC:  Recent Labs Lab 02/19/15 1655 02/20/15 0229 02/20/15 1235 02/21/15 0554 02/22/15 2226 02/23/15 0910  WBC 4.8 5.3 5.3 5.7 6.3 4.8  NEUTROABS 2.8  --   --   --   --   --   HGB 5.9* 6.8* 7.9* 8.4* 8.0* 8.3*  HCT 18.7* 21.7* 24.7* 26.1* 24.3* 25.4*  MCV 89.0 88.2 87.6 87.0 84.7 85.5  PLT 140* 143* 158 163 145* 134*   CBG:  Recent Labs Lab 02/21/15 1727 02/21/15 2139 02/22/15 0813 02/22/15 2351 02/23/15 0819  GLUCAP 129* 121* 91 86 64*   SIGNED: Time coordinating discharge: 30 minutes  MAGICK-Jamacia Jester, MD  [facility] 02/23/2015, 10:28 AM Pager [phone]  If 7PM-7AM, please contact night-coverage [email or web address] Password [email or web address]

## 2015-02-23 NOTE — Progress Notes (Signed)
Post EGD/colonoscopy,  Sub optimal exams due to food retention and only fair prep. No gross lesions seen, although avm's could be missed.Hgb stable. Will hold off on SBCE, follow H/H, stool hemoccults.Chronic anemia, no apparent source.. Will sign off.

## 2015-02-23 NOTE — Progress Notes (Signed)
Chaplain Note:   Chaplain visited with [name].   [name] presented as warm, conversational, and mellow. He noted that he is feeling better than he has in about a month.   Chaplain received consult concerning an HCPOA. Chaplain shared conversation concerning the nature and purpose of the document.   After sharing a conversation around his family and medical history he expressed that he is hopeful and has very positive support from his daughter and her family with whom he lives with.   Pt expressed that he would like future visits and was grateful for this one.   [name], Chaplain 02/23/2015 10:35 AM

## 2015-02-23 NOTE — Anesthesia Postprocedure Evaluation (Signed)
  Anesthesia Post-op Note  Patient: [name]  Procedure(s) Performed: Procedure(s) (LRB): COLONOSCOPY (N/A) ESOPHAGOGASTRODUODENOSCOPY (EGD) (N/A)  Patient Location: PACU  Anesthesia Type: MAC  Level of Consciousness: awake and alert   Airway and Oxygen Therapy: Patient Spontanous Breathing  Post-op Pain: mild  Post-op Assessment: Post-op Vital signs reviewed, Patient's Cardiovascular Status Stable, Respiratory Function Stable, Patent Airway and No signs of Nausea or vomiting  Last Vitals:  Filed Vitals:   02/23/15 0620  BP: 124/70  Pulse: 55  Temp: 37.1 C  Resp: 17    Post-op Vital Signs: stable   Complications: No apparent anesthesia complications

## 2015-02-23 NOTE — Care Management Note (Signed)
Case Management Note  Patient Details  Name: [name] MRN: [id number] Date of Birth: May 20, 1941  Subjective/Objective:        CM following for progression and d/c planning.            Action/Plan: Pt for d/c to home, no d/c needs identified.   Expected Discharge Date:       02/23/2015           Expected Discharge Plan:  Home/Self Care  In-House Referral:  NA  Discharge planning Services  NA  Post Acute Care Choice:  NA Choice offered to:  NA  DME Arranged:    DME Agency:     HH Arranged:    HH Agency:     Status of Service:  Completed, signed off  Medicare Important Message Given:  Yes-second notification given Date Medicare IM Given:    Medicare IM give by:    Date Additional Medicare IM Given:    Additional Medicare Important Message give by:     If discussed at [place] of Stay Meetings, dates discussed:    Additional Comments:  Adron Bene, RN 02/23/2015, 12:16 PM

## 2015-02-23 NOTE — Care Management Note (Signed)
Case Management Note  Patient Details  Name: [name] MRN: [id number] Date of Birth: [birth year]  Subjective/Objective:                CM following for progression and d/c planning.    Action/Plan: No D/C identified.   Expected Discharge Date:      02/23/2015            Expected Discharge Plan:  Home/Self Care  In-House Referral:  NA  Discharge planning Services  NA  Post Acute Care Choice:  NA Choice offered to:  NA  DME Arranged:    DME Agency:     HH Arranged:    HH Agency:     Status of Service:  Completed, signed off  Medicare Important Message Given:  Yes-second notification given Date Medicare IM Given:    Medicare IM give by:    Date Additional Medicare IM Given:    Additional Medicare Important Message give by:     If discussed at [place] of Stay Meetings, dates discussed:    Additional Comments:  Adron Bene, RN 02/23/2015, 10:51 AM

## 2015-02-23 NOTE — Progress Notes (Signed)
Pt discharged to home, IV dc'd Telemetry dc'd, vital stable for pt. Daughter in room with pt. Discharge and education given and quesstions addressed. 02/23/2015 2:34 PM.[name]

## 2015-02-23 NOTE — Progress Notes (Signed)
Physical Therapy Treatment Patient Details Name: [name] MRN: [id number] DOB: 1941/06/25 Today's Date: 02/23/2015    History of Present Illness [name] is a 74 y/o male with a history of end-stage renal disease on hemodialysis Monday Wednesday Friday.  He has a history of aortic stenosis, atrial fibrillation on chronic anticoagulation of Coumadin, diabetes mellitus diet controlled, seizures, BPH, hypertension, gout, CVA who presents from dialysis center with anemia with a hemoglobin of 6.8.  Pt has received units of blood.  Work up in progress.    PT Comments    Continuing progress towards goals; noted more difficulty with rise from a low surface today, and I believe he will benefit from further PT follow-up after discharge;   [name] tells me he enjoyed going to a [place] for a while and would like to get back to something similar; Outpatient PT can help to establish a program and transition to more independence at a gym, as well as address gait and balance dysfunction and weakness.   Follow Up Recommendations  Outpatient PT;Supervision for mobility/OOB     Equipment Recommendations  3in1 (PT) (pt may already have )    Recommendations for Other Services       Precautions / Restrictions Precautions Precautions: Fall    Mobility  Bed Mobility                  Transfers Overall transfer level: Needs assistance Equipment used: Rolling walker (2 wheeled) Transfers: Sit to/from Stand Sit to Stand: Mod assist         General transfer comment: Mod assist to rise  Ambulation/Gait Ambulation/Gait assistance: Min guard (with and without physical contact) Ambulation Distance (Feet): 80 Feet Assistive device: Rolling walker (2 wheeled) Gait Pattern/deviations: Step-to pattern;Decreased step length - left;Trunk flexed Gait velocity: slow   General Gait Details: Walked with his new AFO on; noted occasional decr step length LLE leading to increased fall risk  as his center of mass gets too far in front of feet; Still, he is able to identify forward lean and stop and regain his balance without he need for cues   Stairs            Wheelchair Mobility    Modified Rankin (Stroke Patients Only)       Balance                                    Cognition Arousal/Alertness: Awake/alert Behavior During Therapy: WFL for tasks assessed/performed Overall Cognitive Status: Within Functional Limits for tasks assessed                      Exercises      General Comments        Pertinent Vitals/Pain Pain Assessment: No/denies pain    Home Living                      Prior Function            PT Goals (current goals can now be found in the care plan section) Acute Rehab PT Goals Patient Stated Goal: home soon PT Goal Formulation: With patient Time For Goal Achievement: 02/27/15 Potential to Achieve Goals: Good Progress towards PT goals: Progressing toward goals    Frequency  Min 3X/week    PT Plan Discharge plan needs to be updated    Co-evaluation  End of Session Equipment Utilized During Treatment: Gait belt (L AFO and custom shoes) Activity Tolerance: Patient tolerated treatment well Patient left: in chair;with call bell/phone within reach     Time: 1138-1201 PT Time Calculation (min) (ACUTE ONLY): 23 min  Charges:  $Gait Training: 8-22 mins $Therapeutic Activity: 8-22 mins                    G Codes:      [name] 02/23/2015, 1:06 PM  [name], [name] Pager [phone] Office [phone]'

## 2015-02-23 NOTE — Discharge Instructions (Signed)

## 2015-02-23 NOTE — Progress Notes (Signed)
[facility] Progress Note  Assessment/Plan: 1. Anemia/+FOBT: EGD/CSY today, no obvious source, for capsule study 2. ESRD - Hemodialysis MWF at [facility].HD yesterday. No heparin. 3. Hypertension/volume - No BP meds at home. SBP fluctuates 93-174. Net UF 8119 yesterday. Post Wt 75.9. Still has edema LE. Believe OP EDW 72 kg is appropriate. HD orders written for tomorrow. 4. Anemia - Admit Hgb 5.9 rec 2u PRBC. Repleting Fe. On Mircera 225 - last dose 7/6 - redose with ESA 7/20 200 Aranesp.  HGB 8.0 follow CBCs. Monitor closely. 5. Metabolic bone disease - Ca/P controlled PTH 213 (01/20/15). Continue Renvela - reduced dose while in hospital to 2400 mg PO TID with meals- previous dose needs to be resumed at d/c. Continue sensipar.  6. Nutrition - Albumin 2.6 Renal diet/Renal vit. - add supplements 7. Afib: Coumadin on hold. GI recommeds 14 days hold on coumadin. Since anticoagulants on hold, need to vigilant for DVTs. 8. DM: No home meds. Will follow QID BS per primary with S/S insulin as needed. 9. Aortic Stenosis: Per Primary  Rita H. Brown NP-C 02/23/2015, 9:14 AM  [facility] [phone]  Subjective:     Objective Filed Vitals:   02/22/15 2315 02/22/15 2358 02/23/15 0412 02/23/15 0620  BP: 115/50 116/57  124/70  Pulse: 53 54  55  Temp:  98.5 F (36.9 C)  98.7 F (37.1 C)  TempSrc:      Resp: 19 18  17   Height:      Weight:   75.9 kg (167 lb 5.3 oz)   SpO2:  100%  100%   Physical Exam General: Pleasant, Alert, NAD Heart:Irregular, S1 S2. Has harsh III/VI systolic M. Lungs: Bilateral breath sounds CTA Abdomen: soft, nontender. Extremities: 1+ edema BLE. R. Foot tender to touch.  Dialysis Access: right AVF + bruit  Dialysis Orders: Center: [place] on MWF . EDW 72 kg HD Bath 2.0 K 2.0 Ca Time 4 hours Heparin NO HEPARIN. Access RFA AVF BFR 450 DFR 800  Mircera 225 mcg IV q 2 weeks, last dose 02/10/15.  Calcitriol 2.25 mcg MWF with  HD  Additional Objective Labs: Basic Metabolic Panel:  Recent Labs Lab 02/19/15 1655 02/20/15 0230 02/21/15 0554 02/22/15 2227  NA 139 135 135 125*  K 3.9 4.3 4.4 4.7  CL 98* 97* 97* 87*  CO2 35* 32 31 25  GLUCOSE 81 67 80 83  BUN 24* 31* 31* 54*  CREATININE 4.00* 4.90* 5.36* 7.02*  CALCIUM 7.9* 8.1* 8.6* 8.0*  PHOS 3.5 4.6  --  5.4*   Liver Function Tests:  Recent Labs Lab 02/19/15 1655 02/20/15 0230 02/22/15 2227  AST 19  --   --   ALT 20  --   --   ALKPHOS 46  --   --   BILITOT 0.2*  --   --   PROT 5.2*  --   --   ALBUMIN 2.6* 2.6* 2.6*   No results for input(s): LIPASE, AMYLASE in the last 168 hours. CBC:  Recent Labs Lab 02/19/15 1655 02/20/15 0229 02/20/15 1235 02/21/15 0554 02/22/15 2226  WBC 4.8 5.3 5.3 5.7 6.3  NEUTROABS 2.8  --   --   --   --   HGB 5.9* 6.8* 7.9* 8.4* 8.0*  HCT 18.7* 21.7* 24.7* 26.1* 24.3*  MCV 89.0 88.2 87.6 87.0 84.7  PLT 140* 143* 158 163 145*   Blood Culture    Component Value Date/Time   SDES URINE, CLEAN CATCH 07/28/2011 0423  SPECREQUEST NONE 07/28/2011 0423   CULT NO GROWTH 07/28/2011 0423   REPTSTATUS 07/29/2011 FINAL 07/28/2011 0423    Cardiac Enzymes: No results for input(s): CKTOTAL, CKMB, CKMBINDEX, TROPONINI in the last 168 hours. CBG:  Recent Labs Lab 02/21/15 1727 02/21/15 2139 02/22/15 0813 02/22/15 2351 02/23/15 0819  GLUCAP 129* 121* 91 86 64*   Iron Studies: No results for input(s): IRON, TIBC, TRANSFERRIN, FERRITIN in the last 72 hours. @lablastinr3 @ Studies/Results: No results found. Medications:    sodium chloride   Intravenous Once   allopurinol  100 mg Oral QHS   bisacodyl  10 mg Rectal BID   cinacalcet  90 mg Oral QPC supper   [START ON 02/24/2015] darbepoetin (ARANESP) injection - DIALYSIS  200 mcg Intravenous Q Wed-HD   feeding supplement (NEPRO CARB STEADY)  237 mL Oral BID BM   ferric gluconate (FERRLECIT/NULECIT) IV  125 mg Intravenous Q M,W,F-HD   insulin aspart   0-9 Units Subcutaneous TID WC   levETIRAcetam  250 mg Oral QHS   multivitamin  1 tablet Oral QHS   pantoprazole  40 mg Oral Daily   polyethylene glycol  17 g Oral Daily   senna  1 tablet Oral Daily   sevelamer carbonate  4,000 mg Oral TID WC   sodium chloride  3 mL Intravenous Q12H   sodium chloride  3 mL Intravenous Q12H   tamsulosin  0.4 mg Oral QPC breakfast

## 2015-02-25 ENCOUNTER — Encounter (HOSPITAL_COMMUNITY): Payer: Self-pay | Admitting: Internal Medicine

## 2015-03-25 ENCOUNTER — Encounter: Payer: Self-pay | Admitting: Interventional Cardiology

## 2015-04-17 ENCOUNTER — Inpatient Hospital Stay (HOSPITAL_COMMUNITY)
Admission: EM | Admit: 2015-04-17 | Discharge: 2015-05-08 | DRG: 871 | Disposition: E | Payer: Medicare Other | Attending: Internal Medicine | Admitting: Internal Medicine

## 2015-04-17 ENCOUNTER — Emergency Department (HOSPITAL_BASED_OUTPATIENT_CLINIC_OR_DEPARTMENT_OTHER): Payer: Medicare Other

## 2015-04-17 ENCOUNTER — Encounter (HOSPITAL_COMMUNITY): Payer: Self-pay | Admitting: Family Medicine

## 2015-04-17 ENCOUNTER — Emergency Department (HOSPITAL_COMMUNITY): Payer: Medicare Other

## 2015-04-17 ENCOUNTER — Inpatient Hospital Stay (HOSPITAL_COMMUNITY): Payer: Medicare Other

## 2015-04-17 DIAGNOSIS — N186 End stage renal disease: Secondary | ICD-10-CM | POA: Diagnosis present

## 2015-04-17 DIAGNOSIS — I120 Hypertensive chronic kidney disease with stage 5 chronic kidney disease or end stage renal disease: Secondary | ICD-10-CM | POA: Diagnosis present

## 2015-04-17 DIAGNOSIS — Z85528 Personal history of other malignant neoplasm of kidney: Secondary | ICD-10-CM | POA: Diagnosis not present

## 2015-04-17 DIAGNOSIS — E1140 Type 2 diabetes mellitus with diabetic neuropathy, unspecified: Secondary | ICD-10-CM | POA: Diagnosis present

## 2015-04-17 DIAGNOSIS — Z992 Dependence on renal dialysis: Secondary | ICD-10-CM | POA: Diagnosis not present

## 2015-04-17 DIAGNOSIS — R6521 Severe sepsis with septic shock: Secondary | ICD-10-CM | POA: Diagnosis present

## 2015-04-17 DIAGNOSIS — R55 Syncope and collapse: Secondary | ICD-10-CM

## 2015-04-17 DIAGNOSIS — I350 Nonrheumatic aortic (valve) stenosis: Secondary | ICD-10-CM | POA: Diagnosis present

## 2015-04-17 DIAGNOSIS — Z79899 Other long term (current) drug therapy: Secondary | ICD-10-CM

## 2015-04-17 DIAGNOSIS — R001 Bradycardia, unspecified: Secondary | ICD-10-CM | POA: Diagnosis present

## 2015-04-17 DIAGNOSIS — J9690 Respiratory failure, unspecified, unspecified whether with hypoxia or hypercapnia: Secondary | ICD-10-CM

## 2015-04-17 DIAGNOSIS — A419 Sepsis, unspecified organism: Principal | ICD-10-CM | POA: Diagnosis present

## 2015-04-17 DIAGNOSIS — M109 Gout, unspecified: Secondary | ICD-10-CM | POA: Diagnosis present

## 2015-04-17 DIAGNOSIS — E1122 Type 2 diabetes mellitus with diabetic chronic kidney disease: Secondary | ICD-10-CM | POA: Diagnosis present

## 2015-04-17 DIAGNOSIS — R569 Unspecified convulsions: Secondary | ICD-10-CM | POA: Diagnosis present

## 2015-04-17 DIAGNOSIS — N400 Enlarged prostate without lower urinary tract symptoms: Secondary | ICD-10-CM | POA: Diagnosis present

## 2015-04-17 DIAGNOSIS — N2581 Secondary hyperparathyroidism of renal origin: Secondary | ICD-10-CM | POA: Diagnosis present

## 2015-04-17 DIAGNOSIS — Z86718 Personal history of other venous thrombosis and embolism: Secondary | ICD-10-CM

## 2015-04-17 DIAGNOSIS — M21372 Foot drop, left foot: Secondary | ICD-10-CM | POA: Diagnosis not present

## 2015-04-17 DIAGNOSIS — Z7901 Long term (current) use of anticoagulants: Secondary | ICD-10-CM | POA: Diagnosis not present

## 2015-04-17 DIAGNOSIS — D649 Anemia, unspecified: Secondary | ICD-10-CM | POA: Diagnosis present

## 2015-04-17 DIAGNOSIS — I4891 Unspecified atrial fibrillation: Secondary | ICD-10-CM | POA: Diagnosis present

## 2015-04-17 DIAGNOSIS — I959 Hypotension, unspecified: Secondary | ICD-10-CM

## 2015-04-17 DIAGNOSIS — I69398 Other sequelae of cerebral infarction: Secondary | ICD-10-CM

## 2015-04-17 DIAGNOSIS — E860 Dehydration: Secondary | ICD-10-CM | POA: Diagnosis present

## 2015-04-17 DIAGNOSIS — I213 ST elevation (STEMI) myocardial infarction of unspecified site: Secondary | ICD-10-CM

## 2015-04-17 LAB — I-STAT CHEM 8, ED
BUN: 54 mg/dL — AB (ref 6–20)
BUN: 46 mg/dL — ABNORMAL HIGH (ref 6–20)
CALCIUM ION: 1.07 mmol/L — AB (ref 1.13–1.30)
Chloride: 98 mmol/L — ABNORMAL LOW (ref 101–111)
Chloride: 118 mmol/L — ABNORMAL HIGH (ref 101–111)
Creatinine, Ser: 5.5 mg/dL — ABNORMAL HIGH (ref 0.61–1.24)
Creatinine, Ser: 4.7 mg/dL — ABNORMAL HIGH (ref 0.61–1.24)
Glucose, Bld: 153 mg/dL — ABNORMAL HIGH (ref 65–99)
Glucose, Bld: 250 mg/dL — ABNORMAL HIGH (ref 65–99)
HCT: 34 % — ABNORMAL LOW (ref 39.0–52.0)
HEMATOCRIT: 26 % — AB (ref 39.0–52.0)
HEMOGLOBIN: 8.8 g/dL — AB (ref 13.0–17.0)
Hemoglobin: 11.6 g/dL — ABNORMAL LOW (ref 13.0–17.0)
Potassium: 4.7 mmol/L (ref 3.5–5.1)
Potassium: 7.4 mmol/L (ref 3.5–5.1)
SODIUM: 136 mmol/L (ref 135–145)
SODIUM: 138 mmol/L (ref 135–145)
TCO2: 30 mmol/L (ref 0–100)
TCO2: 21 mmol/L (ref 0–100)

## 2015-04-17 LAB — COMPREHENSIVE METABOLIC PANEL
ALBUMIN: 2.1 g/dL — AB (ref 3.5–5.0)
ALBUMIN: 2 g/dL — AB (ref 3.5–5.0)
ALK PHOS: 91 U/L (ref 38–126)
ALT: 42 U/L (ref 17–63)
ALT: 60 U/L (ref 17–63)
ANION GAP: 12 (ref 5–15)
AST: 52 U/L — AB (ref 15–41)
AST: 70 U/L — ABNORMAL HIGH (ref 15–41)
Alkaline Phosphatase: 105 U/L (ref 38–126)
Anion gap: 14 (ref 5–15)
BUN: 48 mg/dL — AB (ref 6–20)
BUN: 48 mg/dL — ABNORMAL HIGH (ref 6–20)
CALCIUM: 8.7 mg/dL — AB (ref 8.9–10.3)
CHLORIDE: 100 mmol/L — AB (ref 101–111)
CO2: 27 mmol/L (ref 22–32)
CO2: 26 mmol/L (ref 22–32)
CREATININE: 5.69 mg/dL — AB (ref 0.61–1.24)
Calcium: 8.3 mg/dL — ABNORMAL LOW (ref 8.9–10.3)
Chloride: 98 mmol/L — ABNORMAL LOW (ref 101–111)
Creatinine, Ser: 5.61 mg/dL — ABNORMAL HIGH (ref 0.61–1.24)
GFR calc Af Amer: 10 mL/min — ABNORMAL LOW (ref >60)
GFR calc non Af Amer: 9 mL/min — ABNORMAL LOW (ref >60)
GFR, EST AFRICAN AMERICAN: 10 mL/min — AB (ref >60)
GFR, EST NON AFRICAN AMERICAN: 9 mL/min — AB (ref >60)
GLUCOSE: 156 mg/dL — AB (ref 65–99)
GLUCOSE: 147 mg/dL — AB (ref 65–99)
Potassium: 5 mmol/L (ref 3.5–5.1)
Potassium: 4.6 mmol/L (ref 3.5–5.1)
SODIUM: 138 mmol/L (ref 135–145)
Sodium: 139 mmol/L (ref 135–145)
Total Bilirubin: 1.4 mg/dL — ABNORMAL HIGH (ref 0.3–1.2)
Total Bilirubin: 1.7 mg/dL — ABNORMAL HIGH (ref 0.3–1.2)
Total Protein: 6.1 g/dL — ABNORMAL LOW (ref 6.5–8.1)
Total Protein: 6 g/dL — ABNORMAL LOW (ref 6.5–8.1)

## 2015-04-17 LAB — CBC WITH DIFFERENTIAL/PLATELET
BASOS ABS: 0 10*3/uL (ref 0.0–0.1)
Basophils Absolute: 0 10*3/uL (ref 0.0–0.1)
Basophils Relative: 0 % (ref 0–1)
Basophils Relative: 0 % (ref 0–1)
EOS PCT: 0 % (ref 0–5)
EOS PCT: 0 % (ref 0–5)
Eosinophils Absolute: 0 10*3/uL (ref 0.0–0.7)
Eosinophils Absolute: 0 10*3/uL (ref 0.0–0.7)
HCT: 29.7 % — ABNORMAL LOW (ref 39.0–52.0)
HEMATOCRIT: 28.3 % — AB (ref 39.0–52.0)
HEMOGLOBIN: 9.4 g/dL — AB (ref 13.0–17.0)
Hemoglobin: 8.8 g/dL — ABNORMAL LOW (ref 13.0–17.0)
LYMPHS ABS: 2 10*3/uL (ref 0.7–4.0)
LYMPHS ABS: 0.9 10*3/uL (ref 0.7–4.0)
LYMPHS PCT: 4 % — AB (ref 12–46)
Lymphocytes Relative: 8 % — ABNORMAL LOW (ref 12–46)
MCH: 25.1 pg — ABNORMAL LOW (ref 26.0–34.0)
MCH: 24.8 pg — AB (ref 26.0–34.0)
MCHC: 31.6 g/dL (ref 30.0–36.0)
MCHC: 31.1 g/dL (ref 30.0–36.0)
MCV: 79.2 fL (ref 78.0–100.0)
MCV: 79.7 fL (ref 78.0–100.0)
MONO ABS: 3.4 10*3/uL — AB (ref 0.1–1.0)
MONOS PCT: 14 % — AB (ref 3–12)
Monocytes Absolute: 2.5 10*3/uL — ABNORMAL HIGH (ref 0.1–1.0)
Monocytes Relative: 10 % (ref 3–12)
NEUTROS ABS: 20.4 10*3/uL — AB (ref 1.7–7.7)
Neutro Abs: 20 10*3/uL — ABNORMAL HIGH (ref 1.7–7.7)
Neutrophils Relative %: 82 % — ABNORMAL HIGH (ref 43–77)
Neutrophils Relative %: 82 % — ABNORMAL HIGH (ref 43–77)
Platelets: 180 10*3/uL (ref 150–400)
Platelets: 185 10*3/uL (ref 150–400)
RBC: 3.75 MIL/uL — AB (ref 4.22–5.81)
RBC: 3.55 MIL/uL — ABNORMAL LOW (ref 4.22–5.81)
RDW: 19.2 % — AB (ref 11.5–15.5)
RDW: 19.2 % — AB (ref 11.5–15.5)
WBC: 24.9 10*3/uL — ABNORMAL HIGH (ref 4.0–10.5)
WBC: 24.3 10*3/uL — ABNORMAL HIGH (ref 4.0–10.5)

## 2015-04-17 LAB — I-STAT ARTERIAL BLOOD GAS, ED
Acid-Base Excess: 3 mmol/L — ABNORMAL HIGH (ref 0.0–2.0)
BICARBONATE: 27 meq/L — AB (ref 20.0–24.0)
O2 SAT: 96 %
PCO2 ART: 37 mmHg (ref 35.0–45.0)
TCO2: 28 mmol/L (ref 0–100)
pH, Arterial: 7.471 — ABNORMAL HIGH (ref 7.350–7.450)
pO2, Arterial: 75 mmHg — ABNORMAL LOW (ref 80.0–100.0)

## 2015-04-17 LAB — PHOSPHORUS: PHOSPHORUS: 5.1 mg/dL — AB (ref 2.5–4.6)

## 2015-04-17 LAB — CORTISOL: Cortisol, Plasma: 35.9 ug/dL

## 2015-04-17 LAB — I-STAT TROPONIN, ED: TROPONIN I, POC: 0.45 ng/mL — AB (ref 0.00–0.08)

## 2015-04-17 LAB — HEPATIC FUNCTION PANEL
ALT: 207 U/L — ABNORMAL HIGH (ref 17–63)
AST: 291 U/L — ABNORMAL HIGH (ref 15–41)
Albumin: 1.5 g/dL — ABNORMAL LOW (ref 3.5–5.0)
Alkaline Phosphatase: 83 U/L (ref 38–126)
BILIRUBIN DIRECT: 0.5 mg/dL (ref 0.1–0.5)
BILIRUBIN INDIRECT: 0.6 mg/dL (ref 0.3–0.9)
TOTAL PROTEIN: 4.6 g/dL — AB (ref 6.5–8.1)
Total Bilirubin: 1.1 mg/dL (ref 0.3–1.2)

## 2015-04-17 LAB — I-STAT VENOUS BLOOD GAS, ED
Acid-base deficit: 9 mmol/L — ABNORMAL HIGH (ref 0.0–2.0)
Bicarbonate: 21.7 mEq/L (ref 20.0–24.0)
O2 Saturation: 49 %
PCO2 VEN: 76.9 mmHg — AB (ref 45.0–50.0)
PH VEN: 7.06 — AB (ref 7.250–7.300)
TCO2: 24 mmol/L (ref 0–100)
pO2, Ven: 38 mmHg (ref 30.0–45.0)

## 2015-04-17 LAB — PROTIME-INR
INR: 2.06 — ABNORMAL HIGH (ref 0.00–1.49)
INR: 2.23 — ABNORMAL HIGH (ref 0.00–1.49)
Prothrombin Time: 23.1 seconds — ABNORMAL HIGH (ref 11.6–15.2)
Prothrombin Time: 24.5 seconds — ABNORMAL HIGH (ref 11.6–15.2)

## 2015-04-17 LAB — I-STAT CG4 LACTIC ACID, ED
LACTIC ACID, VENOUS: 2.35 mmol/L — AB (ref 0.5–2.0)
Lactic Acid, Venous: 2.14 mmol/L (ref 0.5–2.0)

## 2015-04-17 LAB — LACTIC ACID, PLASMA: LACTIC ACID, VENOUS: 1.9 mmol/L (ref 0.5–2.0)

## 2015-04-17 LAB — MAGNESIUM: Magnesium: 2 mg/dL (ref 1.7–2.4)

## 2015-04-17 LAB — TROPONIN I
TROPONIN I: 0.36 ng/mL — AB (ref <0.031)
Troponin I: 0.35 ng/mL — ABNORMAL HIGH (ref <0.031)
Troponin I: 0.49 ng/mL — ABNORMAL HIGH (ref <0.031)

## 2015-04-17 LAB — APTT: APTT: 52 s — AB (ref 24–37)

## 2015-04-17 LAB — AMYLASE: AMYLASE: 42 U/L (ref 28–100)

## 2015-04-17 LAB — BRAIN NATRIURETIC PEPTIDE: B Natriuretic Peptide: 1055.5 pg/mL — ABNORMAL HIGH (ref 0.0–100.0)

## 2015-04-17 LAB — LIPASE, BLOOD: Lipase: 35 U/L (ref 22–51)

## 2015-04-17 LAB — PROCALCITONIN: PROCALCITONIN: 9.69 ng/mL

## 2015-04-17 MED ORDER — EPINEPHRINE HCL 0.1 MG/ML IJ SOSY
PREFILLED_SYRINGE | INTRAMUSCULAR | Status: AC | PRN
  Administered 2015-04-17 (×3): 1 via INTRAVENOUS

## 2015-04-17 MED ORDER — FENTANYL CITRATE (PF) 100 MCG/2ML IJ SOLN
INTRAMUSCULAR | Status: AC | PRN
  Administered 2015-04-17: 100 ug via INTRAVENOUS

## 2015-04-17 MED ORDER — SODIUM CHLORIDE 0.9 % IV BOLUS (SEPSIS)
500.0000 mL | INTRAVENOUS | Status: AC
  Administered 2015-04-17: 500 mL via INTRAVENOUS

## 2015-04-17 MED ORDER — EPINEPHRINE HCL 0.1 MG/ML IJ SOSY
PREFILLED_SYRINGE | INTRAMUSCULAR | Status: AC | PRN
  Administered 2015-04-17 (×4): 1 via INTRAVENOUS

## 2015-04-17 MED ORDER — VANCOMYCIN HCL 10 G IV SOLR
1750.0000 mg | INTRAVENOUS | Status: AC
  Administered 2015-04-17: 1750 mg via INTRAVENOUS
  Filled 2015-04-17: qty 1750

## 2015-04-17 MED ORDER — NOREPINEPHRINE BITARTRATE 1 MG/ML IV SOLN
0.0000 ug/min | Freq: Once | INTRAVENOUS | Status: AC
  Administered 2015-04-17: 10 ug/min via INTRAVENOUS
  Filled 2015-04-17: qty 4

## 2015-04-17 MED ORDER — LIDOCAINE HCL (CARDIAC) 20 MG/ML IV SOLN
INTRAVENOUS | Status: AC
  Filled 2015-04-17: qty 5

## 2015-04-17 MED ORDER — CALCIUM CHLORIDE 10 % IV SOLN
INTRAVENOUS | Status: AC | PRN
  Administered 2015-04-17: 1 g via INTRAVENOUS

## 2015-04-17 MED ORDER — ASPIRIN 81 MG PO CHEW: 81.0000 mg | CHEWABLE_TABLET | Freq: Every day | ORAL | Status: DC

## 2015-04-17 MED ORDER — SODIUM CHLORIDE 0.9 % IV SOLN: INTRAVENOUS | Status: DC

## 2015-04-17 MED ORDER — ETOMIDATE 2 MG/ML IV SOLN
INTRAVENOUS | Status: AC
  Filled 2015-04-17: qty 20

## 2015-04-17 MED ORDER — ASPIRIN 325 MG PO TABS
325.0000 mg | ORAL_TABLET | Freq: Once | ORAL | Status: AC
  Administered 2015-04-17: 325 mg via ORAL
  Filled 2015-04-17: qty 1

## 2015-04-17 MED ORDER — ACETAMINOPHEN 325 MG PO TABS
650.0000 mg | ORAL_TABLET | ORAL | Status: DC | PRN
  Filled 2015-04-17: qty 2

## 2015-04-17 MED ORDER — SODIUM BICARBONATE 8.4 % IV SOLN
INTRAVENOUS | Status: AC | PRN
  Administered 2015-04-17: 100 meq via INTRAVENOUS
  Administered 2015-04-17: 50 meq via INTRAVENOUS

## 2015-04-17 MED ORDER — SODIUM CHLORIDE 0.9 % IV SOLN: 250.0000 mL | INTRAVENOUS | Status: DC | PRN

## 2015-04-17 MED ORDER — FENTANYL CITRATE (PF) 100 MCG/2ML IJ SOLN
INTRAMUSCULAR | Status: AC
  Filled 2015-04-17: qty 2

## 2015-04-17 MED ORDER — ROCURONIUM BROMIDE 50 MG/5ML IV SOLN
INTRAVENOUS | Status: AC
  Filled 2015-04-17: qty 2

## 2015-04-17 MED ORDER — PIPERACILLIN-TAZOBACTAM IN DEX 2-0.25 GM/50ML IV SOLN
2.2500 g | Freq: Three times a day (TID) | INTRAVENOUS | Status: DC
  Filled 2015-04-17 (×2): qty 50

## 2015-04-17 MED ORDER — AMIODARONE HCL 150 MG/3ML IV SOLN
300.0000 mg | INTRAVENOUS | Status: AC | PRN
  Administered 2015-04-17: 300 mg via INTRAVENOUS

## 2015-04-17 MED ORDER — CALCIUM GLUCONATE 10 % IV SOLN
1.0000 g | Freq: Once | INTRAVENOUS | Status: AC
  Administered 2015-04-17: 1 g via INTRAVENOUS
  Filled 2015-04-17: qty 10

## 2015-04-17 MED ORDER — MIDAZOLAM HCL 5 MG/5ML IJ SOLN
INTRAMUSCULAR | Status: AC | PRN
  Administered 2015-04-17: 2 mg via INTRAVENOUS

## 2015-04-17 MED ORDER — SODIUM CHLORIDE 0.9 % IV BOLUS (SEPSIS)
1000.0000 mL | INTRAVENOUS | Status: AC
  Administered 2015-04-17 (×2): 1000 mL via INTRAVENOUS

## 2015-04-17 MED ORDER — SUCCINYLCHOLINE CHLORIDE 20 MG/ML IJ SOLN
INTRAMUSCULAR | Status: AC
  Filled 2015-04-17: qty 1

## 2015-04-17 MED ORDER — PIPERACILLIN-TAZOBACTAM 3.375 G IVPB 30 MIN
3.3750 g | INTRAVENOUS | Status: AC
  Administered 2015-04-17: 3.375 g via INTRAVENOUS
  Filled 2015-04-17: qty 50

## 2015-04-17 MED ORDER — MIDAZOLAM HCL 2 MG/2ML IJ SOLN
INTRAMUSCULAR | Status: AC
  Filled 2015-04-17: qty 2

## 2015-04-17 MED ORDER — VANCOMYCIN HCL IN DEXTROSE 750-5 MG/150ML-% IV SOLN: 750.0000 mg | INTRAVENOUS | Status: DC

## 2015-04-17 MED ORDER — SODIUM CHLORIDE 0.9 % IV SOLN
INTRAVENOUS | Status: DC
  Administered 2015-04-17: 11:00:00 via INTRAVENOUS

## 2015-04-17 MED ORDER — DEXTROSE 50 % IV SOLN
INTRAVENOUS | Status: AC | PRN
  Administered 2015-04-17: 1 via INTRAVENOUS

## 2015-04-17 MED ORDER — SODIUM BICARBONATE 8.4 % IV SOLN
INTRAVENOUS | Status: AC | PRN
  Administered 2015-04-17: 50 meq via INTRAVENOUS

## 2015-04-17 MED ORDER — SODIUM BICARBONATE 8.4 % IV SOLN
50.0000 meq | Freq: Once | INTRAVENOUS | Status: AC
  Administered 2015-04-17: 50 meq via INTRAVENOUS
  Filled 2015-04-17: qty 50

## 2015-04-18 MED FILL — Medication: Qty: 1 | Status: AC

## 2015-04-20 ENCOUNTER — Telehealth: Payer: Self-pay

## 2015-04-20 LAB — CULTURE, BLOOD (ROUTINE X 2)

## 2015-04-20 NOTE — Telephone Encounter (Signed)
On 04/20/2015 I received a death certificate from [place]. The death certificate is for cremation. The patient is a patient of Doctor Maneem. The death certificate will be taken to the [address] unit at [facility] this pm for signature. On Apr 27, 2015 I received the death certificate back from Doctor Maneem. I got the death certificate ready for signature and called the funeral home to let them know the death certificate was ready for pickup.

## 2015-04-21 LAB — CULTURE, BLOOD (ROUTINE X 2)

## 2015-05-05 ENCOUNTER — Ambulatory Visit: Payer: Medicare Other | Admitting: Podiatry

## 2015-05-08 NOTE — Code Documentation (Signed)
Pulses regained 

## 2015-05-08 NOTE — Code Documentation (Signed)
Zoll Pads placed on patient, CPR resumed.

## 2015-05-08 NOTE — Progress Notes (Signed)
  Echocardiogram 2D Echocardiogram has been performed.  [name] 05-06-15, 10:50 AM

## 2015-05-08 NOTE — ED Provider Notes (Signed)
CSN: [id number]     Arrival date & time 05-08-15  3007 History   First MD Initiated Contact with Patient 08-May-2015 0830     Chief Complaint  Patient presents with  . Hypotension  . Bradycardia    HPI   HOMMER CUNLIFFE is a 74 y.o. male with a PMH of atrial fibrillation not currently on anticoagulation due to recent GI bleed, aortic stenosis, HTN, hyperkalemia, bradycardia, anemia, DM, stroke, seizures, ESRD on dialysis who presents to the ED with hypotension and bradycardia. Patient is unsure why he was taken to the ED by EMS, but states his daughter called this morning. Reports non-productive cough x 1 week, weakness, shortness of breath, and left shoulder pain. His last dialysis was yesterday, which he states he completed. Spoke with his daughter on the phone, who reports the patient has experienced progressively worsening weakness, shortness of breath, and left shoulder pain for the last week. She reports he had an unwitnessed fall on Wednesday, but she states she does not think he hit his head. Reports her kids have been sick with URI symptoms over the last week.   Past Medical History  Diagnosis Date  . Atrial fibrillation     coumadin followed by SNF, longstanding persistent and probably permanent afib  . Aortic stenosis     Echo 02/12/11: moderate aortic stenosis, mean gradient 21,  . Hyperparathyroidism, secondary   . Neuropathy     diabetec  . Hypertension   . Hyperkalemia   . BPH (benign prostatic hypertrophy)   . Carotid bruit     Doppler 7/12: Plaque without significant ICA stenosis  . Bradycardia     asymptomatic  . Atrial enlargement, bilateral   . Type II diabetes mellitus   . DVT (deep venous thrombosis)   . Pneumonia     "couple times" (02/19/2015)  . Sleep apnea     "wore mask for 3-4 years; don't know if I still need to; don't have machine anymore" (02/19/2015)  . Anemia     chronic disease  . History of blood transfusion 02/12/2015    "related to blood in my stool"   . Seizures     "~ 2006/2007; don't know what kind; took RX for awhile"   . Stroke 1999    "foot drop on left foot since" (02/19/2015)  . History of gout   . ESRD (end stage renal disease) on dialysis     "MWF; [facility]; [place]" (02/19/2015)  . Renal carcinoma ~ 2007    followed at [facility]; s/p gamma knife surgery  . BPH (benign prostatic hyperplasia) 02/19/2015   Past Surgical History  Procedure Laterality Date  . Bunionectomy Bilateral 1981  . Radiofrequency ablation kidney    . Posterior cervical laminectomy       C3-C6 laminectomy with Dr. Vertell Limber and Dr. Joya Salm  . Thrombectomy Bilateral 1999, 2004    "calves"  . Tonsillectomy    . Back surgery    . Av fistula placement Right     forearm  . Av fistula repair  "several times"    "had to put a couple stents in it; cleared it out"  . Colonoscopy N/A 02/22/2015    Procedure: COLONOSCOPY;  Surgeon: Lafayette Dragon, MD;  Location: [facility] ENDOSCOPY;  Service: Endoscopy;  Laterality: N/A;  . Esophagogastroduodenoscopy N/A 02/22/2015    Procedure: ESOPHAGOGASTRODUODENOSCOPY (EGD);  Surgeon: Lafayette Dragon, MD;  Location: [facility] ENDOSCOPY;  Service: Endoscopy;  Laterality: N/A;   Family History  Problem Relation Age  of Onset  . Diabetes Mother   . Heart disease Mother   . Hyperlipidemia Mother   . Hypertension Mother   . Diabetes Father   . Hyperlipidemia Father   . Hypertension Father   . Diabetes Sister   . Kidney disease Sister   . Diabetes Brother   . Kidney disease Brother    Social History  Substance Use Topics  . Smoking status: Never Smoker   . Smokeless tobacco: Never Used  . Alcohol Use: 0.0 oz/week    0 Standard drinks or equivalent per week     Comment: 02/19/2015 "quit drinking alcohol in the 1990's"    Review of Systems  Constitutional: Positive for fatigue. Negative for fever and chills.  Respiratory: Positive for cough and shortness of breath.   Cardiovascular: Negative for chest pain, palpitations and leg  swelling.  Gastrointestinal: Negative for nausea, vomiting, abdominal pain, diarrhea and constipation.  Musculoskeletal: Positive for arthralgias.       Reports left shoulder pain.  Neurological: Positive for weakness. Negative for dizziness, syncope, light-headedness, numbness and headaches.       Reports generalized weakness.  All other systems reviewed and are negative.     Allergies  Review of patient's allergies indicates no known allergies.  Home Medications   Prior to Admission medications   Medication Sig Start Date End Date Taking? Authorizing Provider  allopurinol (ZYLOPRIM) 100 MG tablet Take 100 mg by mouth daily after supper.     Historical Provider, MD  bisacodyl (DULCOLAX) 10 MG suppository Place 1 suppository (10 mg total) rectally 2 (two) times daily. 02/23/15   Theodis Blaze, MD  calcium carbonate, dosed in mg elemental calcium, 1250 (500 CA) MG/5ML Take 5 mLs (500 mg of elemental calcium total) by mouth every 6 (six) hours as needed for indigestion. 02/23/15   Theodis Blaze, MD  cinacalcet (SENSIPAR) 90 MG tablet Take 90 mg by mouth daily after supper.    Historical Provider, MD  folic acid-vitamin b complex-vitamin c-selenium-zinc (DIALYVITE) 3 MG TABS Take 1 tablet by mouth daily after supper.     Historical Provider, MD  levETIRAcetam (KEPPRA) 500 MG tablet Take 250 mg by mouth daily after supper.     Historical Provider, MD  nitroGLYCERIN (NITROSTAT) 0.4 MG SL tablet Place 0.4 mg under the tongue every 5 (five) minutes as needed for chest pain.     Historical Provider, MD  pantoprazole (PROTONIX) 40 MG tablet  02/18/15   Historical Provider, MD  polyethylene glycol (MIRALAX / GLYCOLAX) packet Take 17 g by mouth daily as needed (constipation). For constipation    Historical Provider, MD  senna (SENOKOT) 8.6 MG TABS Take 1 tablet by mouth daily as needed (constipation).     Historical Provider, MD  sevelamer carbonate (RENVELA) 800 MG tablet Take 5 tablets (4,000 mg  total) by mouth See admin instructions. Take 5 tablets (4000 mg) with meals and 4 tablets (3200 mg) with snacks 02/23/15   Theodis Blaze, MD  Tamsulosin HCl (FLOMAX) 0.4 MG CAPS Take 0.4 mg by mouth daily after breakfast.     Historical Provider, MD  zolpidem (AMBIEN) 5 MG tablet Take 1 tablet (5 mg total) by mouth at bedtime as needed for sleep (Insomnia). 02/23/15   Theodis Blaze, MD    BP 84/53 mmHg  Pulse 50  Temp(Src) 98.6 F (37 C) (Oral)  Resp 18  Wt 171 lb (77.565 kg)  SpO2 100% Physical Exam  Constitutional: No distress.  Chronically ill  appearing male in no acute distress.  HENT:  Head: Normocephalic and atraumatic.  Right Ear: External ear normal.  Left Ear: External ear normal.  Nose: Nose normal.  Mouth/Throat: Oropharynx is clear and moist.  Eyes: Conjunctivae and EOM are normal. Pupils are equal, round, and reactive to light.  Neck: Normal range of motion. Neck supple.  Cardiovascular: Normal rate, regular rhythm and intact distal pulses.   3/6 systolic murmur throughout precordium. Fistula to left upper extremity with palpable thrill.  Pulmonary/Chest: Effort normal and breath sounds normal. No respiratory distress. He has no wheezes.  Abdominal: Soft. He exhibits no distension. There is no tenderness. There is no rebound and no guarding.  Musculoskeletal: Normal range of motion. He exhibits no edema or tenderness.  Neurological: He is alert.  Skin: Skin is warm and dry. No rash noted. He is not diaphoretic. No erythema. No pallor.  Psychiatric: He has a normal mood and affect. His behavior is normal.  Nursing note and vitals reviewed.   ED Course  Procedures (including critical care time)  Labs Review Labs Reviewed  CBC WITH DIFFERENTIAL/PLATELET - Abnormal; Notable for the following:    WBC 24.9 (*)    RBC 3.75 (*)    Hemoglobin 9.4 (*)    HCT 29.7 (*)    MCH 25.1 (*)    RDW 19.2 (*)    Neutrophils Relative % 82 (*)    Lymphocytes Relative 8 (*)     Neutro Abs 20.4 (*)    Monocytes Absolute 2.5 (*)    All other components within normal limits  COMPREHENSIVE METABOLIC PANEL - Abnormal; Notable for the following:    Chloride 98 (*)    Glucose, Bld 156 (*)    BUN 48 (*)    Creatinine, Ser 5.69 (*)    Calcium 8.7 (*)    Total Protein 6.1 (*)    Albumin 2.1 (*)    AST 52 (*)    Total Bilirubin 1.4 (*)    GFR calc non Af Amer 9 (*)    GFR calc Af Amer 10 (*)    All other components within normal limits  I-STAT CG4 LACTIC ACID, ED - Abnormal; Notable for the following:    Lactic Acid, Venous 2.14 (*)    All other components within normal limits  I-STAT TROPOININ, ED - Abnormal; Notable for the following:    Troponin i, poc 0.45 (*)    All other components within normal limits  I-STAT CHEM 8, ED - Abnormal; Notable for the following:    Chloride 98 (*)    BUN 54 (*)    Creatinine, Ser 5.50 (*)    Glucose, Bld 153 (*)    Calcium, Ion 1.07 (*)    Hemoglobin 11.6 (*)    HCT 34.0 (*)    All other components within normal limits  I-STAT ARTERIAL BLOOD GAS, ED - Abnormal; Notable for the following:    pH, Arterial 7.471 (*)    pO2, Arterial 75.0 (*)    Bicarbonate 27.0 (*)    Acid-Base Excess 3.0 (*)    All other components within normal limits  CULTURE, BLOOD (ROUTINE X 2)  CULTURE, BLOOD (ROUTINE X 2)  URINE CULTURE  URINALYSIS, ROUTINE W REFLEX MICROSCOPIC (NOT AT [facility])  PROTIME-INR  BLOOD GAS, ARTERIAL  TROPONIN I  I-STAT CG4 LACTIC ACID, ED    Imaging Review Dg Chest Port 1 View  2015/04/18   CLINICAL DATA:  Sepsis  EXAM: PORTABLE CHEST - 1 VIEW  COMPARISON:  04/24/2012  FINDINGS: Moderate cardiomegaly. Clear lungs. Normal vascularity. No pleural effusion. No pneumothorax. Vascular stents project over the right axilla and right upper lung zone. There are likely venous.  IMPRESSION: Cardiomegaly without decompensation.   Electronically Signed   By: Marybelle Killings M.D.   On: 04/27/2015 09:17     I have personally reviewed  and evaluated these images and lab results as part of my medical decision-making.   EKG Interpretation   Date/Time:  2015/04/27 EDT Ventricular Rate:  49 PR Interval:    QRS Duration: 145 QT Interval:  628 QTC Calculation: 567 R Axis:   83 Text Interpretation:  A-flutter/fibrillation w/ complete AV block Left  bundle branch block Inferior infarct, acute Lateral leads are also  involved STEMI unchanged  Confirmed by YAO  MD, DAVID ([id number]) on 27-Apr-2015  10:47:00 AM      MDM   Final diagnoses:  Septic shock  ST elevation myocardial infarction (STEMI), unspecified artery  ESRD (end stage renal disease)   74 year old male presents with hypotension and bradycardia. Lives with his daughter, who called EMS this morning. Patient reports dry cough x 1 week, shortness of breath, weakness, and left shoulder pain. Patient is a poor historian, and is unsure why he is in the ED. Last dialysis was yesterday, patient reports he completed his dialysis session. Spoke with patient's daughter on the phone. She reports he has become progressively weaker over the past week and has complained of shortness of breath and left shoulder pain x 1 week. Patient seen with Dr. Darl Householder.   Patient bradycardic to 50s, hypotensive to 76H systolic. Fluids started with 1L NS bolus and NS infusion. Code sepsis activated on arrival . Blood cultures obtained. Started on vanc and zosyn per pharmacy for empiric coverage. Initial EKG with possible STEMI in lateral and inferior leads and concern for hyperkalemia. Given bicarb, calcium gluconate. Code STEMI activated at 9:26. Dr. Darl Householder discussed case with Dr. Irish Lack. Troponin 0.45. Stat echo ordered. Echo with no wall motion abnormality.   CBC shows leukocytosis with WBC 24.9, anemia with hemoglobin 9.4. Lactic acid 2.14. Chem 8 with creatinine 5.50, bicarb 30, potassium 4.7. CMP with creatinine 5.69, bicarb 27, potassium 5. ABG with pH 7.47, bicarb 27, CO2 28.  CXR reveals cardiomegaly without decompensation.   BP improved to 95/50.  Cardiology consulted, who will see the patient. Recommended ICU admission.  Critical care saw patient, feel patient does not require ICU level care at this point and can tolerate step-down given improved BP. [facility] hospitalist consulted, state patient is already admitted to critical care service; hospitalist to discuss with critical care, most likely ICU admission.    BP 84/53 mmHg  Pulse 50  Temp(Src) 98.6 F (37 C) (Oral)  Resp 18  Wt 171 lb (77.565 kg)  SpO2 100%        Marella Chimes, PA-C 04/27/15 [place] [address] Yao, MD 04/18/15 [phone]

## 2015-05-08 NOTE — Code Documentation (Addendum)
Epi in, Pulse Check, no pulses, Vfib

## 2015-05-08 NOTE — Progress Notes (Signed)
   04-19-2015 1600  Clinical Encounter Type  Visited With Family;Health care provider  Visit Type ED;Death  Referral From Nurse  Spiritual Encounters  Spiritual Needs Grief support;Prayer  [place] paged to ED; [facility] met with family and MD who informed family of pt death; [place] offered emotional, grief and spiritual support; [place] facilitated family visit with pt after pt moved to [place]; [place] offered prayer with family; [place] worked with [facility] team regarding next of kin and other arrangements from family. 4:06 PM Gwynn Burly

## 2015-05-08 NOTE — Code Documentation (Addendum)
Central Line Placed by [place] NP, pulses lost

## 2015-05-08 NOTE — ED Notes (Signed)
X-ray at bedside

## 2015-05-08 NOTE — ED Notes (Signed)
2 Versed Given

## 2015-05-08 NOTE — ED Notes (Signed)
Echo at bedside

## 2015-05-08 NOTE — Code Documentation (Signed)
Pulse check: no pulse 

## 2015-05-08 NOTE — Code Documentation (Addendum)
Pulse check; no pulse, compressions resumed.

## 2015-05-08 NOTE — ED Notes (Addendum)
Dr. Vaughan Browner and Velna Hatchet NP at bedside to inutbate. Verbal orders for 136mcg Fentanyl and 2mg  Versed.

## 2015-05-08 NOTE — Progress Notes (Signed)
Pt became suddenly unresponsive at 2:04 pm. And then went into PEA. Pt emergently intubated and CPR started at 2:27. See code documentation for more details. Pt got multiple rounds of Epi, shocked X 2, Amiodarone bolus. We regained pulse for about 10-15 mins and then lost it again. Code was finally stopped at 3:28 pm and patient pronounced dead. Family informed.   Marshell Garfinkel MD [facility] Pager [phone] If no answer or after 3pm call: [phone] 04/25/2015, 3:59 PM

## 2015-05-08 NOTE — H&P (Signed)
PULMONARY / CRITICAL CARE MEDICINE   Name: [name] MRN: [id number] DOB: 09/16/1940    ADMISSION DATE:  May 04, 2015 CONSULTATION DATE:  2015-05-04  REFERRING MD :  ED  CHIEF COMPLAINT:  Cough, weakness, shortness of breath, hypotension  INITIAL PRESENTATION: 74 year old with end-stage renal disease on dialysis. He complains of nonproductive cough for one week shortness of breath and left shoulder pain. Sent to the ED by family. Hypertensive in the ED got 1 L of IV fluid. With recovery of MAPs to the 60s. EKG concerning for ST elevation MI. Elevated WBC count concerning for sepsis. PCCM called to evaluate  STUDIES:  CT head pending. Echo pending.  SIGNIFICANT EVENTS:  HISTORY OF PRESENT ILLNESS:   74 year old with history of atrial fibrillation, , aortic stenosis, HTN, hyperkalemia, bradycardia, anemia, DM, stroke, seizures, ESRD on dialysis. He underwent dialysis yesterday and thinks that more fluid was removed than usual yet been feeling weak with malaise nonproductive cough and left shoulder pain for 1 week. He fell yesterday and hit his head. Sent to the ED today by his family for further evaluation  PAST MEDICAL HISTORY :   has a past medical history of Atrial fibrillation; Aortic stenosis; Hyperparathyroidism, secondary; Neuropathy; Hypertension; Hyperkalemia; BPH (benign prostatic hypertrophy); Carotid bruit; Bradycardia; Atrial enlargement, bilateral; Type II diabetes mellitus; DVT (deep venous thrombosis); Pneumonia; Sleep apnea; Anemia; History of blood transfusion (02/12/2015); Seizures; Stroke (1999); History of gout; ESRD (end stage renal disease) on dialysis; Renal carcinoma (~ 2007); and BPH (benign prostatic hyperplasia) (02/19/2015).  has past surgical history that includes Bunionectomy (Bilateral, 1981); Radiofrequency ablation kidney; Posterior cervical laminectomy; Thrombectomy (Bilateral, 1999, 2004); Tonsillectomy; Back surgery; AV fistula placement (Right); AV fistula  repair ("several times"); Colonoscopy (N/A, 02/22/2015); and Esophagogastroduodenoscopy (N/A, 02/22/2015). Prior to Admission medications   Medication Sig Start Date End Date Taking? Authorizing Provider  allopurinol (ZYLOPRIM) 100 MG tablet Take 100 mg by mouth daily after supper.     Historical Provider, MD  B Complex-C-Folic Acid (DIALYVITE TABLET) TABS Take 1 tablet by mouth daily. 04/15/15   Historical Provider, MD  bisacodyl (DULCOLAX) 10 MG suppository Place 1 suppository (10 mg total) rectally 2 (two) times daily. 02/23/15   Theodis Blaze, MD  calcium carbonate, dosed in mg elemental calcium, 1250 (500 CA) MG/5ML Take 5 mLs (500 mg of elemental calcium total) by mouth every 6 (six) hours as needed for indigestion. 02/23/15   Theodis Blaze, MD  cinacalcet (SENSIPAR) 90 MG tablet Take 90 mg by mouth daily after supper.    Historical Provider, MD  COUMADIN 2.5 MG tablet  03/10/15   Historical Provider, MD  COUMADIN 5 MG tablet  03/10/15   Historical Provider, MD  folic acid-vitamin b complex-vitamin c-selenium-zinc (DIALYVITE) 3 MG TABS Take 1 tablet by mouth daily after supper.     Historical Provider, MD  levETIRAcetam (KEPPRA) 500 MG tablet Take 250 mg by mouth daily after supper.     Historical Provider, MD  nitroGLYCERIN (NITROSTAT) 0.4 MG SL tablet Place 0.4 mg under the tongue every 5 (five) minutes as needed for chest pain.     Historical Provider, MD  pantoprazole (PROTONIX) 40 MG tablet  02/18/15   Historical Provider, MD  polyethylene glycol (MIRALAX / GLYCOLAX) packet Take 17 g by mouth daily as needed (constipation). For constipation    Historical Provider, MD  senna (SENOKOT) 8.6 MG TABS Take 1 tablet by mouth daily as needed (constipation).     Historical Provider, MD  sevelamer carbonate (RENVELA)  800 MG tablet Take 5 tablets (4,000 mg total) by mouth See admin instructions. Take 5 tablets (4000 mg) with meals and 4 tablets (3200 mg) with snacks 02/23/15   Theodis Blaze, MD  Tamsulosin HCl  (FLOMAX) 0.4 MG CAPS Take 0.4 mg by mouth daily after breakfast.     Historical Provider, MD  zolpidem (AMBIEN) 5 MG tablet Take 1 tablet (5 mg total) by mouth at bedtime as needed for sleep (Insomnia). 02/23/15   Theodis Blaze, MD   No Known Allergies  FAMILY HISTORY:  indicated that his mother is deceased. He indicated that his father is deceased. He indicated that all of his three sisters are deceased. He indicated that two of his six brothers are alive. He indicated that his maternal grandmother is deceased. He indicated that his maternal grandfather is deceased. He indicated that his paternal grandmother is deceased. He indicated that his paternal grandfather is deceased.  SOCIAL HISTORY:  reports that he has never smoked. He has never used smokeless tobacco. He reports that he drinks alcohol. He reports that he does not use illicit drugs.  REVIEW OF SYSTEMS:   ROS  Review of Systems - General ROS: positive for  - malaise and weakness Allergy and Immunology ROS: negative Hematological and Lymphatic ROS: negative Respiratory ROS: no cough, shortness of breath, or wheezing Cardiovascular ROS: no chest pain or dyspnea on exertion Gastrointestinal ROS: no abdominal pain, change in bowel habits, or black or bloody stools Neurological ROS: no TIA or stroke symptoms All other ROS are negative.  SUBJECTIVE:   VITAL SIGNS: Temp:  [98.6 F (37 C)] 98.6 F (37 C) (09/10 0838) Pulse Rate:  [49-51] 49 (09/10 1000) Resp:  [18-26] 20 (09/10 1000) BP: (78-86)/(41-55) 86/55 mmHg (09/10 1000) SpO2:  [95 %-100 %] 99 % (09/10 1000) Weight:  [171 lb (77.565 kg)] 171 lb (77.565 kg) (09/10 0850) HEMODYNAMICS:   VENTILATOR SETTINGS:   INTAKE / OUTPUT: No intake or output data in the 24 hours ending 05/08/15 1011  PHYSICAL EXAMINATION: General:  Elderly gentleman, in no apparent distress Neuro:  Awake, Oriented HEENT:  PERRLA, Moist mucus membranes Cardiovascular:  Irregular rate, II/IV  ejection SM. Lungs:  Clear antr Abdomen:  Soft, + BS Skin:  Intact, No edema  LABS:  CBC  Recent Labs Lab May 08, 2015 0900 May 08, 2015 0905  WBC 24.9*  --   HGB 9.4* 11.6*  HCT 29.7* 34.0*  PLT 180  --    Coag's No results for input(s): APTT, INR in the last 168 hours. BMET  Recent Labs Lab 05-08-15 0900 2015-05-08 0905  NA 139 136  K 5.0 4.7  CL 98* 98*  CO2 27  --   BUN 48* 54*  CREATININE 5.69* 5.50*  GLUCOSE 156* 153*   Electrolytes  Recent Labs Lab 2015-05-08 0900  CALCIUM 8.7*   Sepsis Markers  Recent Labs Lab 2015/05/08 0905  LATICACIDVEN 2.14*   ABG  Recent Labs Lab 2015/05/08 0955  PHART 7.471*  PCO2ART 37.0  PO2ART 75.0*   Liver Enzymes  Recent Labs Lab May 08, 2015 0900  AST 52*  ALT 42  ALKPHOS 91  BILITOT 1.4*  ALBUMIN 2.1*   Cardiac Enzymes No results for input(s): TROPONINI, PROBNP in the last 168 hours. Glucose No results for input(s): GLUCAP in the last 168 hours.  Imaging Dg Chest Port 1 View  05/08/15   CLINICAL DATA:  Sepsis  EXAM: PORTABLE CHEST - 1 VIEW  COMPARISON:  04/24/2012  FINDINGS: Moderate cardiomegaly. Clear lungs.  Normal vascularity. No pleural effusion. No pneumothorax. Vascular stents project over the right axilla and right upper lung zone. There are likely venous.  IMPRESSION: Cardiomegaly without decompensation.   Electronically Signed   By: Marybelle Killings M.D.   On: 05/06/15 09:17    ASSESSMENT / PLAN: PULMONARY A: No issues P:   Stable on RA. Will continue to monitor  CARDIOVASCULAR CVL A: Elevated troponin with ST changes on EKG ? STEMI Afib P:  Consult cardiology Stat echo cardiogram Aspirin Start heparin drip for A fib and ACS IVF at 100cc/hr  RENAL A:   ESRD on HD P:   Consult nephrology  GASTROINTESTINAL A:  No issues P:   Keep NPO  HEMATOLOGIC A: H/O anemia Negative EGD/colonoscope on 02/23/15 P:  Monitor  INFECTIOUS A:  Likely sepsis with unclear source. P:   BCx2   Pending UC Sputum  Abx: Vancomycin, start date 05-06-15 Zosyn, start date 05-06-15  Empiric broad spectrum antibiotics ICU procacitonin protocol  ENDOCRINE A: H/O of DM. Not on any therapy P:   SSI  NEUROLOGIC A:  H/O seizures P:   Continue Keppra CT head.   FAMILY  - Updates: Daughter updated 9/10 - Inter-disciplinary family meet or Palliative Care meeting due by:  9/17   TODAY'S SUMMARY: H/O ESRD on Dialysis. Admitted with malaise hypotension- Sepsis of unclear source vs NSTEMI. Also dehydrated after last HD session.  Marshell Garfinkel MD [facility] Pager [phone] If no answer or after 3pm call: [phone] 05/06/2015, 10:35 AM

## 2015-05-08 NOTE — Code Documentation (Signed)
1 amp bi-carb

## 2015-05-08 NOTE — ED Notes (Signed)
Spoke with ICU MD, states will come to reassess patient.

## 2015-05-08 NOTE — Procedures (Signed)
Intubation Procedure Note [name] [id number] May 04, 1941  Procedure: Intubation Indications: Airway protection and maintenance  Procedure Details Consent: Unable to obtain consent because of emergent medical necessity. Time Out: Verified patient identification, verified procedure, site/side was marked, verified correct patient position, special equipment/implants available, medications/allergies/relevent history reviewed, required imaging and test results available.  Performed  Evaluation Hemodynamic Status: Persistent hypotension treated with pressors; O2 sats: unable to be detrermined as pleth was bad Patient's Current Condition: unstable Complications: No apparent complications Patient did not tolerate procedure well. Chest X-ray ordered to verify placement.  CXR: pending.   Cyndy Braver 20-Apr-2015

## 2015-05-08 NOTE — Code Documentation (Addendum)
Noe Gens NP placing central line. Pulses Present

## 2015-05-08 NOTE — Code Documentation (Addendum)
1 of Epi given

## 2015-05-08 NOTE — Progress Notes (Signed)
   OnCall STEMI :   Called to evaluate abnormal ECG in patient with ESRD, sx of fatigue/malaise without chest pain.  ECG was abnormal with hyperacute T waves noted in the inferolateral leads with some ST elevation.  No clear reciprocal changes and the pattern was concerning for metabolic disorder.  Potassium was normal but WBC was elevated and lactate was elevated, concerning for sepsis.    Stat echo was performed in the setting of minimal troponin elevation and hypotension.  By my preliminary look, patient has severe aortic stenosis which was known but also significant mitral and tricuspid regurgitation.  No regional wall motion abnormalities. Hyperdynamic LV function. LVH. Final report pending.  Repeat ECG after IV fluid and antibiotics showed significant improvement in the ST segment changes.  Cancel code STEMI.  Follow enzymes.  Cardiology to follow.  Will need ICU.  PCCM also following.  Jettie Booze, MD

## 2015-05-08 NOTE — ED Notes (Signed)
Levophed increased to 59mcg per CCNP

## 2015-05-08 NOTE — ED Notes (Signed)
Called Pt's daughter per pt request to update on plan of care.

## 2015-05-08 NOTE — ED Notes (Signed)
Respiratory and Dr. Darl Householder at bedside to prepare for intubation

## 2015-05-08 NOTE — ED Notes (Addendum)
Was in with patient speaking to him regarding shoulder pain, left to retrieve Tylenol, upon re-entry, patient is unresponsive.  Has Bilateral femoral Pulses, Dr, Darl Householder at bedside. BP 75/25, Pulse 77, respirations ~12 .  Pt drooling, suctioned patient and placed on non-rebreather.  Paged ICU physician.

## 2015-05-08 NOTE — ED Notes (Signed)
Admitting at bedside 

## 2015-05-08 NOTE — ED Notes (Signed)
Chest compressions started, lost pulses

## 2015-05-08 NOTE — ED Notes (Signed)
Pt states urinates 1x/week and voided this morning PTA.

## 2015-05-08 NOTE — ED Notes (Signed)
Levophed increased to 50mg

## 2015-05-08 NOTE — Code Documentation (Signed)
120J shocked

## 2015-05-08 NOTE — ED Notes (Signed)
Pt presents from home where he lives with his daughter with c/o dry cough, hypotension, and general weakness x1 week.  Pt is alert and oriented but feels "very weak"

## 2015-05-08 NOTE — ED Notes (Signed)
100 Fentanyl Given

## 2015-05-08 NOTE — Progress Notes (Addendum)
ANTICOAGULATION CONSULT NOTE - Initial Consult  Pharmacy Consult:  Heparin + Vancomycin/Zosyn Indication:  ACS +  Sepsis  No Known Allergies  Patient Measurements: Weight: 171 lb (77.565 kg) Heparin Dosing Weight: 78 kg  Vital Signs: Temp: 98.6 F (37 C) (09/10 0838) Temp Source: Oral (09/10 0838) BP: 84/53 mmHg (09/10 0900) Pulse Rate: 50 (09/10 0900)  Labs:  Recent Labs  05/11/15 0900 05-11-2015 0905  HGB 9.4* 11.6*  HCT 29.7* 34.0*  PLT 180  --   CREATININE  --  5.50*    Estimated Creatinine Clearance: 12.2 mL/min (by C-G formula based on Cr of 5.5).   Medical History: Past Medical History  Diagnosis Date  . Atrial fibrillation     coumadin followed by SNF, longstanding persistent and probably permanent afib  . Aortic stenosis     Echo 02/12/11: moderate aortic stenosis, mean gradient 21,  . Hyperparathyroidism, secondary   . Neuropathy     diabetec  . Hypertension   . Hyperkalemia   . BPH (benign prostatic hypertrophy)   . Carotid bruit     Doppler 7/12: Plaque without significant ICA stenosis  . Bradycardia     asymptomatic  . Atrial enlargement, bilateral   . Type II diabetes mellitus   . DVT (deep venous thrombosis)   . Pneumonia     "couple times" (02/19/2015)  . Sleep apnea     "wore mask for 3-4 years; don't know if I still need to; don't have machine anymore" (02/19/2015)  . Anemia     chronic disease  . History of blood transfusion 02/12/2015    "related to blood in my stool"  . Seizures     "~ 2006/2007; don't know what kind; took RX for awhile"   . Stroke 1999    "foot drop on left foot since" (02/19/2015)  . History of gout   . ESRD (end stage renal disease) on dialysis     "MWF; [facility]; [place]" (02/19/2015)  . Renal carcinoma ~ 2007    followed at [facility]; s/p gamma knife surgery  . BPH (benign prostatic hyperplasia) 02/19/2015      Assessment: 8 YOM presented with hypotension and bradycardia to start vancomycin and Zosyn for  sepsis.  Noted patient has ESRD on MWF HD and his last session was yesterday 04/16/15 per documentation.   Pharmacy also received consult to initiate IV heparin for ACS.  He has a history of Afib on Coumadin PTA.  STAT PT/INR has been ordered.   Goal of Therapy:  Heparin level 0.3-0.7 units/ml Monitor platelets by anticoagulation protocol: Yes  Vanc pre-HD level:  15-25 mcg/mL    Plan:  - Vanc 1750mg  IV x 1, then 750mg  IV q-HD MWF - Zosyn 3.375gm IV x 1 (infuse over 30 min), then 2.25gm IV Q8H - Monitor HD scheduled, clinical progress, vanc level as indicated - F/U INR prior to initiating IV heparin    Daveah Varone D. Mina Marble, PharmD, BCPS Pager:  [phone] 05-11-2015, 9:33 AM    ==================   Addendum: - INR 2.08 on admit   Plan: - Repeat INR this afternoon and start heparin if less than 2.    Xandra Laramee D. Mina Marble, PharmD, BCPS Pager:  [phone] 05/11/2015, 11:08 AM

## 2015-05-08 NOTE — Code Documentation (Addendum)
Pt in Vfib, charging, Epi given

## 2015-05-08 NOTE — Code Documentation (Signed)
Levophed started at 10mcg.

## 2015-05-08 DEATH — deceased

## 2015-05-27 ENCOUNTER — Ambulatory Visit: Payer: BC Managed Care – PPO | Admitting: Physician Assistant

## 2015-09-15 IMAGING — CR DG CHEST 1V PORT
1 series · 1 of 1 positions shown · non-contrast
Comparison: 04/24/2012

CLINICAL DATA: Sepsis

EXAM:
PORTABLE CHEST - 1 VIEW

[AP]
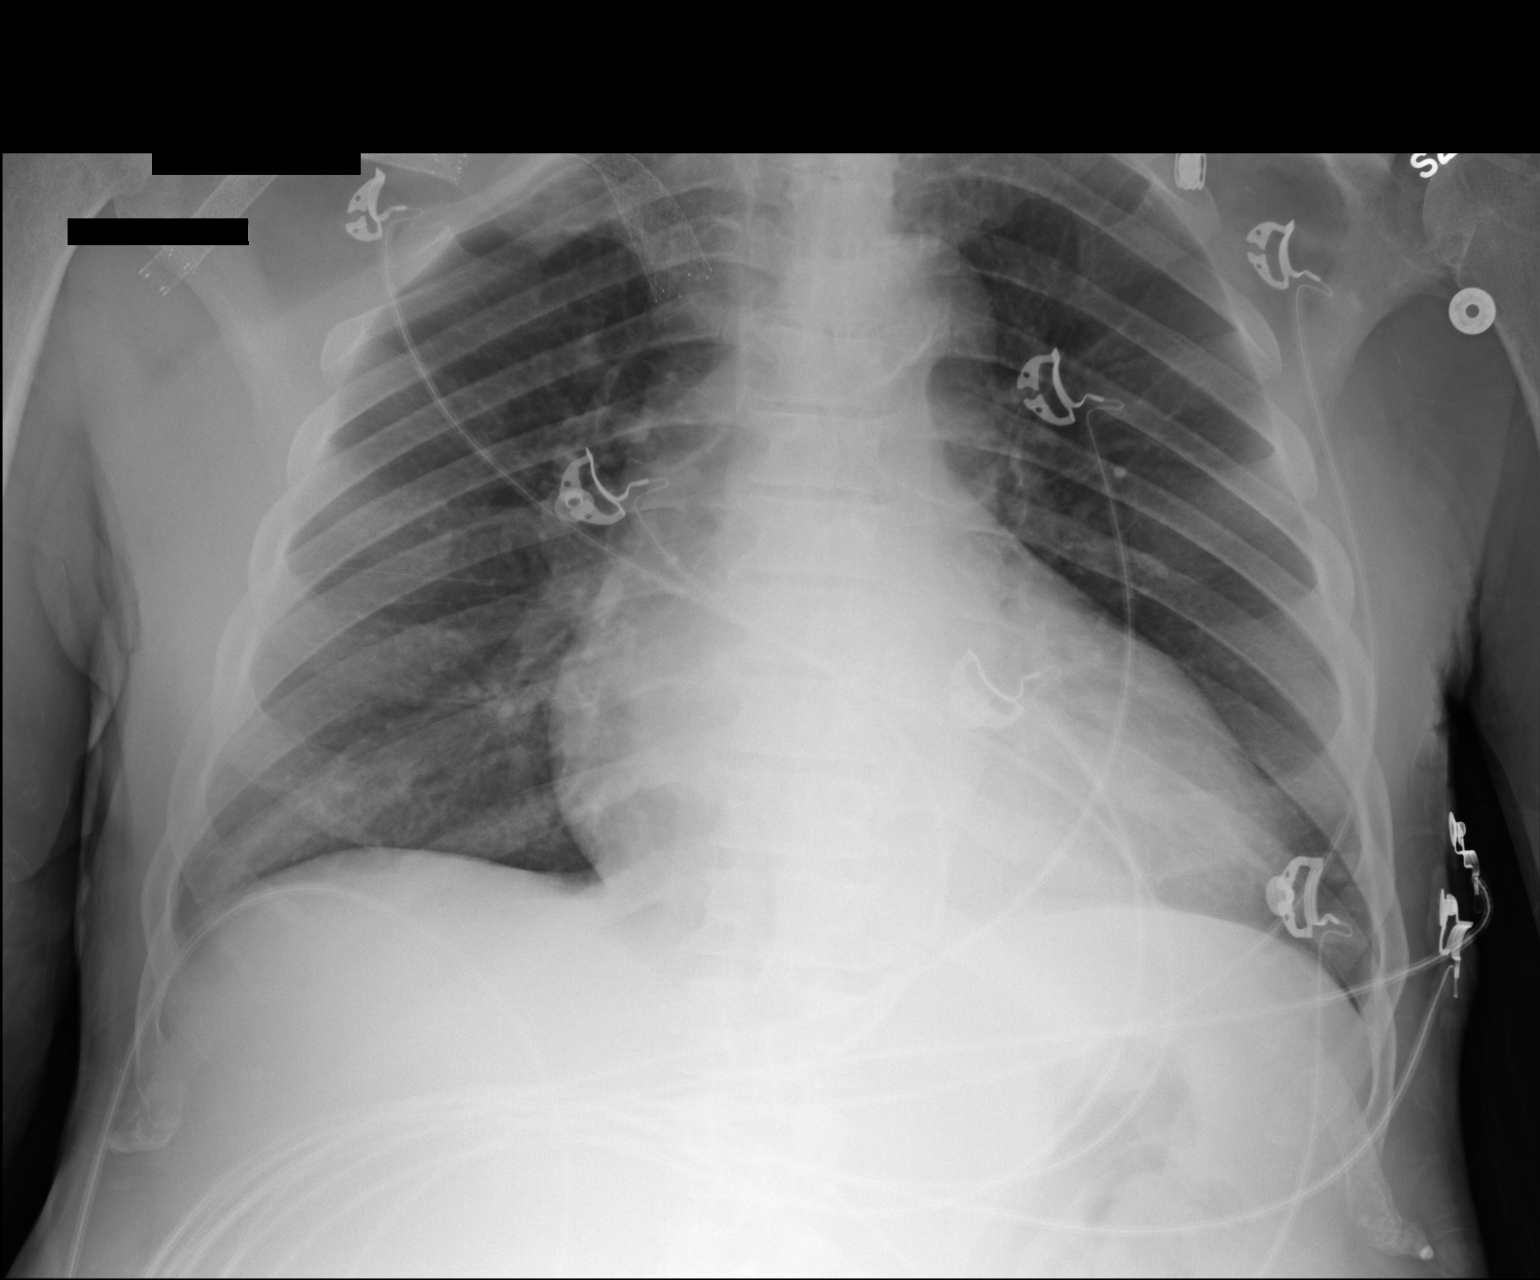

[1 of 1 positions shown; findings below may reference images not displayed]

FINDINGS: Moderate cardiomegaly. Clear lungs. Normal vascularity. No pleural
effusion. No pneumothorax. Vascular stents project over the right
axilla and right upper lung zone. There are likely venous.
IMPRESSION: Cardiomegaly without decompensation.
